# Patient Record
Sex: Female | Born: 1985 | Race: White | Hispanic: No | Marital: Single | Smoking: Never smoker
Health system: Southern US, Community
[De-identification: ages and names within clinical notes are randomized; demographics above are authoritative.]

## PROBLEM LIST (undated history)

## (undated) DIAGNOSIS — D649 Anemia, unspecified: Secondary | ICD-10-CM

## (undated) DIAGNOSIS — J45909 Unspecified asthma, uncomplicated: Secondary | ICD-10-CM

## (undated) DIAGNOSIS — R55 Syncope and collapse: Secondary | ICD-10-CM

## (undated) DIAGNOSIS — K5909 Other constipation: Secondary | ICD-10-CM

## (undated) DIAGNOSIS — M6289 Other specified disorders of muscle: Secondary | ICD-10-CM

## (undated) DIAGNOSIS — K219 Gastro-esophageal reflux disease without esophagitis: Secondary | ICD-10-CM

## (undated) DIAGNOSIS — F419 Anxiety disorder, unspecified: Secondary | ICD-10-CM

## (undated) DIAGNOSIS — Q431 Hirschsprung's disease: Secondary | ICD-10-CM

## (undated) HISTORY — DX: Unspecified asthma, uncomplicated: J45.909

## (undated) HISTORY — DX: Anemia, unspecified: D64.9

## (undated) HISTORY — PX: OTHER SURGICAL HISTORY: SHX169

## (undated) HISTORY — DX: Hirschsprung's disease: Q43.1

## (undated) HISTORY — PX: ABDOMINAL HYSTERECTOMY: SHX81

## (undated) HISTORY — DX: Other specified disorders of muscle: M62.89

## (undated) HISTORY — DX: Other constipation: K59.09

## (undated) HISTORY — DX: Anxiety disorder, unspecified: F41.9

## (undated) HISTORY — DX: Syncope and collapse: R55

## (undated) HISTORY — DX: Gastro-esophageal reflux disease without esophagitis: K21.9

## (undated) HISTORY — PX: OVARIAN CYST REMOVAL: SHX89

## (undated) MED FILL — Iron Sucrose Inj 20 MG/ML (Fe Equiv): INTRAVENOUS | Qty: 5 | Status: AC

## (undated) NOTE — *Deleted (*Deleted)
[facility]  [address], [address] [place], [place] [address] Phone: [phone]  Fax: [phone]   Clinic Day:  06/26/2020  Referring physician: Rayetta Humphrey, MD  Chief Complaint: Latasha [name] is a 54 y.o. 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  HPI: The patient was last seen in the hematology clinic on 02/24/2020. At that time, she was fatigued.  Exam was stable. Hematocrit was 37.5, hemoglobin 12.5, platelets 213,000, WBC 6,400. Ferritin was 17. She received Venofer.  She received Venofer on 03/09/2020 and 03/16/2020.  Labs on 05/04/2020 revealed a hematocrit of 38.8, hemoglobin 13.1, platelets 238,000, WBC 5,400. Ferritin was 76.  During the interim, ***   Past Medical History:  Diagnosis Date  . Acid reflux   . Anemia   . Anxiety   . Asthma   . Chronic constipation   . Hirschsprung disease   . Pelvic floor dysfunction   . Syncope, cardiogenic     Past Surgical History:  Procedure Laterality Date  . abdomial adhesions removed  2012  . APPENDECTOMY  1997  . bladder repair surgery  1997  . c- sections  [id number]  . enodmetiosis removed  2012  . OVARIAN CYST REMOVAL  2003, 2012  . TONSILLECTOMY  2007    Family History  Problem Relation Age of Onset  . Cancer Mother   . Diabetes Mother   . Hypertension Mother   . Cancer Father   . Cancer Maternal Grandfather   . Factor IX deficiency Maternal Grandfather   . Factor IX deficiency Son   . Kidney cancer Neg Hx   . Bladder Cancer Neg Hx     Social History:  reports that she has never smoked. She has never used smokeless tobacco. She reports that she does not drink alcohol and does not use drugs. She lives in [place]. The patient is alone*** today.  Allergies:  Allergies  Allergen Reactions  . Meperidine Anaphylaxis    Other reaction(s): Other (See Comments) Other Reaction: Not Assessed  . Lactose     Other  reaction(s): Other (See Comments) Other Reaction: GI Upset  . Fludrocortisone Hives  . Levofloxacin Hives  . Sulfamethoxazole-Trimethoprim Other (See Comments)    Other reaction(s): Dizziness    Current Medications: Current Outpatient Medications  Medication Sig Dispense Refill  . Acidophilus Lactobacillus CAPS Take 1 tablet by mouth daily.  (Patient not taking: Reported on 02/23/2020)    . albuterol (PROAIR HFA) 108 (90 Base) MCG/ACT inhaler Inhale 1 puff into the lungs every 6 (six) hours as needed for wheezing or shortness of breath.     . EPINEPHrine (EPIPEN 2-PAK) 0.3 mg/0.3 mL IJ SOAJ injection Inject 0.3 mg into the muscle as needed. Reported on 02/07/2016 (Patient not taking: Reported on 02/23/2020)    . fluticasone (FLONASE) 50 MCG/ACT nasal spray SHAKE LQ AND U 2 SPRAYS IEN QD    . hydrOXYzine (ATARAX/VISTARIL) 25 MG tablet TK 1 T PO QD PRN    . midodrine (PROAMATINE) 10 MG tablet Take 10 mg by mouth daily.     . norethindrone-ethinyl estradiol (JUNEL 1/20) 1-20 MG-MCG tablet Take 1 tablet by mouth daily.    Marland Kitchen omeprazole (PRILOSEC) 40 MG capsule 1 capsule by mouth daily  0  . ondansetron (ZOFRAN) 4 MG tablet Take 4 mg by mouth every 8 (eight) hours as needed for nausea.  (Patient not taking: Reported on 02/23/2020)    . ondansetron (ZOFRAN-ODT)  4 MG disintegrating tablet Take by mouth. (Patient not taking: Reported on 02/23/2020)    . Plecanatide 3 MG TABS Take 3 mg by mouth daily.     . promethazine (PHENERGAN) 25 MG tablet Take 25 mg by mouth every 8 (eight) hours as needed for vomiting.  (Patient not taking: Reported on 02/23/2020)    . sertraline (ZOLOFT) 100 MG tablet Take 200 mg by mouth daily.     . Sodium Phosphates (RA ENEMA) 7-19 GM/118ML ENEM Place rectally as needed (constipation).  (Patient not taking: Reported on 02/23/2020)     No current facility-administered medications for this visit.    Review of Systems  Constitutional: Positive for malaise/fatigue (worsening). Negative  for chills, diaphoresis, fever and weight loss.       Feels "like things are trending down".  HENT: Negative.  Negative for congestion, ear discharge, ear pain, hearing loss, nosebleeds, sinus pain, sore throat and tinnitus.   Eyes: Negative.  Negative for blurred vision and double vision.  Respiratory: Negative.  Negative for cough, hemoptysis, sputum production and shortness of breath.   Cardiovascular: Positive for palpitations. Negative for chest pain, orthopnea, leg swelling and PND.  Gastrointestinal: Negative for abdominal pain, blood in stool, constipation, diarrhea, heartburn, melena, nausea and vomiting.       Ice pica. Bowels are "terrible".  Genitourinary: Negative.  Negative for dysuria, frequency, hematuria and urgency.       Normal menstrual cycles.   Musculoskeletal: Negative.  Negative for back pain, falls, joint pain and myalgias.  Skin: Negative.  Negative for itching and rash.  Neurological: Negative.  Negative for dizziness, tremors, speech change, focal weakness, weakness and headaches.  Endo/Heme/Allergies: Bruises/bleeds easily.  Psychiatric/Behavioral: Negative.  Negative for depression and memory loss. The patient is not nervous/anxious and does not have insomnia.   All other systems reviewed and are negative.  Performance status (ECOG):  1***  Vital Signs There were no vitals taken for this visit.   Physical Exam Vitals and nursing note reviewed.  Constitutional:      General: She is not in acute distress.    Appearance: She is well-developed. She is not diaphoretic.  HENT:     Head: Normocephalic and atraumatic.     Comments: Long brown hair.    Mouth/Throat:     Mouth: Mucous membranes are moist.     Pharynx: Oropharynx is clear.  Eyes:     General: No scleral icterus.    Extraocular Movements: Extraocular movements intact.     Conjunctiva/sclera: Conjunctivae normal.     Pupils: Pupils are equal, round, and reactive to light.     Comments: Blue  eyes.  Cardiovascular:     Rate and Rhythm: Normal rate and regular rhythm.     Heart sounds: Normal heart sounds. No murmur heard.   Pulmonary:     Effort: Pulmonary effort is normal. No respiratory distress.     Breath sounds: Normal breath sounds. No wheezing or rales.  Chest:     Chest wall: No tenderness.  Abdominal:     General: Bowel sounds are normal. There is no distension.     Palpations: Abdomen is soft. There is no mass.     Tenderness: There is no abdominal tenderness. There is no guarding or rebound.  Musculoskeletal:        General: No swelling or tenderness. Normal range of motion.     Cervical back: Normal range of motion and neck supple.  Lymphadenopathy:     Head:  Right side of head: No preauricular, posterior auricular or occipital adenopathy.     Left side of head: No preauricular, posterior auricular or occipital adenopathy.     Cervical: No cervical adenopathy.     Upper Body:     Right upper body: No supraclavicular or axillary adenopathy.     Left upper body: No supraclavicular or axillary adenopathy.     Lower Body: No right inguinal adenopathy. No left inguinal adenopathy.  Skin:    General: Skin is warm and dry.  Neurological:     Mental Status: She is alert and oriented to person, place, and time.  Psychiatric:        Behavior: Behavior normal.        Thought Content: Thought content normal.        Judgment: Judgment normal.    No visits with results within 3 Day(s) from this visit.  Latest known visit with results is:  Appointment on 05/04/2020  Component Date Value Ref Range Status  . Ferritin 05/04/2020 76  11 - 307 ng/mL Final   Performed at [facility], [address] [place] [place], [place] [address]  . WBC 05/04/2020 5.4  4.0 - 10.5 K/uL Final  . RBC 05/04/2020 4.06  3.87 - 5.11 MIL/uL Final  . Hemoglobin 05/04/2020 13.1  12.0 - 15.0 g/dL Final  . HCT 60/45/4098 38.8  36 - 46 % Final  . MCV 05/04/2020 95.6  80.0 - 100.0 fL  Final  . MCH 05/04/2020 32.3  26.0 - 34.0 pg Final  . MCHC 05/04/2020 33.8  30.0 - 36.0 g/dL Final  . RDW 11/91/4782 11.9  11.5 - 15.5 % Final  . Platelets 05/04/2020 238  150 - 400 K/uL Final  . nRBC 05/04/2020 0.0  0.0 - 0.2 % Final  . Neutrophils Relative % 05/04/2020 52  % Final  . Neutro Abs 05/04/2020 2.9  1.7 - 7.7 K/uL Final  . Lymphocytes Relative 05/04/2020 36  % Final  . Lymphs Abs 05/04/2020 1.9  0.7 - 4.0 K/uL Final  . Monocytes Relative 05/04/2020 8  % Final  . Monocytes Absolute 05/04/2020 0.4  0.1 - 1.0 K/uL Final  . Eosinophils Relative 05/04/2020 3  % Final  . Eosinophils Absolute 05/04/2020 0.2  0.0 - 0.5 K/uL Final  . Basophils Relative 05/04/2020 1  % Final  . Basophils Absolute 05/04/2020 0.0  0.0 - 0.1 K/uL Final  . Immature Granulocytes 05/04/2020 0  % Final  . Abs Immature Granulocytes 05/04/2020 0.01  0.00 - 0.07 K/uL Final   Performed at [facility], [address], [place], [place] [address]    Assessment:  Latasha [name] is a 78 y.o. female with chronic constipation andiron deficiency anemia. Ferritin was 3 on 10/04/2014 consistent with iron deficiency. She denies any melena or hematochezia. She has a history of heavy mensesfelt secondary to endometriosis. Menses is now light on BCP.  EGDwas normal in 08/2016. Colonoscopywas normal in 09/2015. She has a history ofmicroscopic hematuria. CT hematuria work-up on 04/29/2017 was negative. She is followed by urology.  Work-upon 07/20/2016confirmed iron deficiency (ferritin 5). B12 and folate were normal.   She has received Venofer 600 mg (03/15/2015 - 04/19/2015), x2 (08/09/2015 - 08/16/2015), x1 (10/31/2015), x2 (05/08/2016 - 05/15/2016), x2 (03/05/2017 - 03/12/2017), x2 (06/04/2017 - 06/11/2017), x3 (12/24/2017 - 01/15/2018), x 2 (06/24/2018 - 07/01/2018), x 2 (02/25/2019 - 03/04/2019), and x3 (02/24/2020 - 03/16/2020). She receives Venoferif ferritin is <50 with symptoms.   Ferritinhas been  followed: 5 on 03/08/2015, 28 on 03/29/2015, 64 on 04/26/2015, 9 on 07/26/2015, 40 on 08/23/2015, 35 on 09/20/2015 29 on 10/25/2015, 32 on 02/07/2016, 36 on 05/08/2016, 39 on 02/17/2017, 39 on 05/27/2017, 42 on 09/03/2017, 32 on 12/23/2017, 68 on 03/26/2018, 42 on 06/23/2018 102 on 09/24/2018, 32 on 12/22/2018, 20 on 02/24/2019, 70 on 08/25/2019, 36 on 11/24/2019, 17 on 02/23/2020, 76 on 05/04/2020, and 109 on 06/27/2020.  She has a son with factor IX deficiency. Another son is being evaluated for von Willebrand's disease. Work-up on 07/20/2016revealed a normal platelet count, PT, PTT, von Willebrand panel, and multimers.  She has a history of microscopic hematuria. CT hematuria workup on 04/29/2017 revealed no findings to account for the patient's microscopic hematuria. She is followed by urology. Repeat urinalysis revealed no microscopic hematuria.  The patient received the Pfizer COVID-19 vaccine on 11/05/2019 and 11/26/2019.  Symptomatically, ***  Plan: 1.   Review labs from 06/27/2020.   2.   Iron deficiency             Hematocrit 36.9.  Hemoglobin 12.2.  MCV 95.1.             Ferritin 109.             Continue IV iron if ferritin < 30 and symptomatic.             She has become symptomatic.  Venofer 100 mg IV today and weekly x 2 (total 3).   Urine pregnancy test prior to infusions. 3.   RTC in 2 months for labs (CBC with diff, ferritin). 4.   RTC in 4 months for MD assessment, labs (CBC with diff, ferritin- day before) and +/- Venofer.  I discussed the assessment and treatment plan with the patient.  The patient was provided an opportunity to ask questions and all were answered.  The patient agreed with the plan and demonstrated an understanding of the instructions.  The patient was advised to call back if the symptoms worsen or if the condition fails to improve as anticipated.  I provided *** minutes of face-to-face time during this this encounter and > 50%  was spent counseling as documented under my assessment and plan.  Rosey [place], MD, PhD    06/26/2020, 3:29 PM  I, Danella Penton Tufford, am acting as Neurosurgeon for [employer]. Merlene Pulling, MD, PhD.  I, Melissa C. Merlene Pulling, MD, have reviewed the above documentation for accuracy and completeness, and I agree with the above.

---
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
## 2006-08-28 ENCOUNTER — Emergency Department: Payer: Self-pay | Admitting: Emergency Medicine

## 2007-04-10 ENCOUNTER — Ambulatory Visit: Payer: Self-pay | Admitting: Emergency Medicine

## 2007-09-03 DIAGNOSIS — K5090 Crohn's disease, unspecified, without complications: Secondary | ICD-10-CM | POA: Insufficient documentation

## 2007-12-04 ENCOUNTER — Other Ambulatory Visit: Payer: Self-pay

## 2007-12-04 ENCOUNTER — Emergency Department: Payer: Self-pay | Admitting: Emergency Medicine

## 2007-12-05 ENCOUNTER — Ambulatory Visit: Payer: Self-pay | Admitting: Vascular Surgery

## 2008-09-04 ENCOUNTER — Emergency Department: Payer: Self-pay | Admitting: Emergency Medicine

## 2008-09-12 ENCOUNTER — Emergency Department: Payer: Self-pay | Admitting: Emergency Medicine

## 2008-11-22 ENCOUNTER — Emergency Department: Payer: Self-pay | Admitting: Emergency Medicine

## 2008-12-07 ENCOUNTER — Ambulatory Visit: Payer: Self-pay | Admitting: Internal Medicine

## 2009-04-04 ENCOUNTER — Ambulatory Visit: Payer: Self-pay | Admitting: Urology

## 2009-05-29 ENCOUNTER — Emergency Department (HOSPITAL_COMMUNITY): Admission: EM | Admit: 2009-05-29 | Discharge: 2009-05-29 | Payer: Self-pay | Admitting: Emergency Medicine

## 2009-08-09 ENCOUNTER — Ambulatory Visit (HOSPITAL_BASED_OUTPATIENT_CLINIC_OR_DEPARTMENT_OTHER): Admission: RE | Admit: 2009-08-09 | Discharge: 2009-08-09 | Payer: Self-pay | Admitting: Neurology

## 2009-09-15 ENCOUNTER — Encounter: Admission: RE | Admit: 2009-09-15 | Discharge: 2009-09-15 | Payer: Self-pay | Admitting: Neurology

## 2009-10-03 ENCOUNTER — Ambulatory Visit (HOSPITAL_BASED_OUTPATIENT_CLINIC_OR_DEPARTMENT_OTHER): Admission: RE | Admit: 2009-10-03 | Discharge: 2009-10-03 | Payer: Self-pay | Admitting: Neurology

## 2009-10-08 ENCOUNTER — Ambulatory Visit: Payer: Self-pay | Admitting: Pulmonary Disease

## 2010-07-23 ENCOUNTER — Ambulatory Visit: Payer: Self-pay | Admitting: Family Medicine

## 2010-08-19 HISTORY — PX: OTHER SURGICAL HISTORY: SHX169

## 2010-11-22 LAB — CBC
HCT: 34.7 % — ABNORMAL LOW (ref 36.0–46.0)
MCV: 92.5 fL (ref 78.0–100.0)
RBC: 3.74 MIL/uL — ABNORMAL LOW (ref 3.87–5.11)
WBC: 8.2 10*3/uL (ref 4.0–10.5)

## 2010-11-22 LAB — DIFFERENTIAL
Lymphocytes Relative: 23 % (ref 12–46)
Lymphs Abs: 1.9 10*3/uL (ref 0.7–4.0)
Monocytes Relative: 8 % (ref 3–12)
Neutrophils Relative %: 66 % (ref 43–77)

## 2010-11-22 LAB — WET PREP, GENITAL
Clue Cells Wet Prep HPF POC: NONE SEEN
Trich, Wet Prep: NONE SEEN
Yeast Wet Prep HPF POC: NONE SEEN

## 2010-11-22 LAB — URINALYSIS, ROUTINE W REFLEX MICROSCOPIC
Bilirubin Urine: NEGATIVE
Glucose, UA: NEGATIVE mg/dL
Ketones, ur: NEGATIVE mg/dL
Leukocytes, UA: NEGATIVE
Protein, ur: NEGATIVE mg/dL

## 2010-11-22 LAB — GC/CHLAMYDIA PROBE AMP, GENITAL: Chlamydia, DNA Probe: NEGATIVE

## 2010-11-22 LAB — PREGNANCY, URINE: Preg Test, Ur: NEGATIVE

## 2010-11-22 LAB — BASIC METABOLIC PANEL
CO2: 30 mEq/L (ref 19–32)
Calcium: 8.8 mg/dL (ref 8.4–10.5)
Chloride: 110 mEq/L (ref 96–112)
Potassium: 3.6 mEq/L (ref 3.5–5.1)
Sodium: 143 mEq/L (ref 135–145)

## 2011-04-20 ENCOUNTER — Emergency Department: Payer: Self-pay | Admitting: Emergency Medicine

## 2011-07-12 ENCOUNTER — Ambulatory Visit: Payer: Self-pay

## 2011-07-19 DIAGNOSIS — R55 Syncope and collapse: Secondary | ICD-10-CM | POA: Insufficient documentation

## 2011-07-19 DIAGNOSIS — K219 Gastro-esophageal reflux disease without esophagitis: Secondary | ICD-10-CM | POA: Insufficient documentation

## 2011-07-19 DIAGNOSIS — J45909 Unspecified asthma, uncomplicated: Secondary | ICD-10-CM | POA: Insufficient documentation

## 2011-07-19 DIAGNOSIS — J309 Allergic rhinitis, unspecified: Secondary | ICD-10-CM | POA: Insufficient documentation

## 2011-07-19 DIAGNOSIS — F419 Anxiety disorder, unspecified: Secondary | ICD-10-CM | POA: Insufficient documentation

## 2011-12-26 ENCOUNTER — Emergency Department: Payer: Self-pay | Admitting: *Deleted

## 2011-12-26 LAB — CBC
HCT: 37.9 % (ref 35.0–47.0)
HGB: 12.7 g/dL (ref 12.0–16.0)
MCH: 30.5 pg (ref 26.0–34.0)
MCHC: 33.5 g/dL (ref 32.0–36.0)
MCV: 91 fL (ref 80–100)
Platelet: 191 10*3/uL (ref 150–440)
RBC: 4.16 10*6/uL (ref 3.80–5.20)
RDW: 13.4 % (ref 11.5–14.5)
WBC: 5.6 10*3/uL (ref 3.6–11.0)

## 2011-12-26 LAB — URINALYSIS, COMPLETE
Ketone: NEGATIVE
RBC,UR: NONE SEEN /HPF (ref 0–5)
Specific Gravity: 1.01 (ref 1.003–1.030)
Squamous Epithelial: 7
WBC UR: <1 /HPF (ref 0–5)

## 2011-12-26 LAB — COMPREHENSIVE METABOLIC PANEL
Albumin: 3.9 g/dL (ref 3.4–5.0)
BUN: 13 mg/dL (ref 7–18)
Bilirubin,Total: 0.3 mg/dL (ref 0.2–1.0)
Calcium, Total: 8.8 mg/dL (ref 8.5–10.1)
Chloride: 107 mmol/L (ref 98–107)
Co2: 28 mmol/L (ref 21–32)
EGFR (African American): >60
EGFR (Non-African Amer.): >60
Glucose: 96 mg/dL (ref 65–99)
Osmolality: 279 (ref 275–301)
SGPT (ALT): 67 U/L
Sodium: 140 mmol/L (ref 136–145)
Total Protein: 7.3 g/dL (ref 6.4–8.2)

## 2012-03-09 DIAGNOSIS — F32A Depression, unspecified: Secondary | ICD-10-CM | POA: Insufficient documentation

## 2014-07-15 ENCOUNTER — Emergency Department: Payer: Self-pay | Admitting: Emergency Medicine

## 2014-07-15 LAB — URINALYSIS, COMPLETE
BILIRUBIN, UR: NEGATIVE
Bacteria: NONE SEEN
Blood: NEGATIVE
GLUCOSE, UR: NEGATIVE mg/dL (ref 0–75)
KETONE: NEGATIVE
NITRITE: NEGATIVE
PROTEIN: NEGATIVE
Ph: 6 (ref 4.5–8.0)
RBC,UR: 3 /HPF (ref 0–5)
Specific Gravity: 1.02 (ref 1.003–1.030)
WBC UR: 8 /HPF (ref 0–5)

## 2014-07-15 LAB — COMPREHENSIVE METABOLIC PANEL
ALBUMIN: 3.7 g/dL (ref 3.4–5.0)
ALK PHOS: 59 U/L
ANION GAP: 4 — AB (ref 7–16)
BUN: 9 mg/dL (ref 7–18)
Bilirubin,Total: 0.3 mg/dL (ref 0.2–1.0)
CALCIUM: 8.2 mg/dL — AB (ref 8.5–10.1)
CHLORIDE: 106 mmol/L (ref 98–107)
CO2: 29 mmol/L (ref 21–32)
Creatinine: 0.96 mg/dL (ref 0.60–1.30)
Glucose: 124 mg/dL — ABNORMAL HIGH (ref 65–99)
Osmolality: 278 (ref 275–301)
POTASSIUM: 3.5 mmol/L (ref 3.5–5.1)
SGOT(AST): 22 U/L (ref 15–37)
SGPT (ALT): 27 U/L
SODIUM: 139 mmol/L (ref 136–145)
Total Protein: 6.9 g/dL (ref 6.4–8.2)

## 2014-07-15 LAB — CBC WITH DIFFERENTIAL/PLATELET
BASOS ABS: 0 10*3/uL (ref 0.0–0.1)
Basophil %: 0.2 %
EOS ABS: 0.4 10*3/uL (ref 0.0–0.7)
EOS PCT: 3.2 %
HCT: 34.8 % — AB (ref 35.0–47.0)
HGB: 11 g/dL — AB (ref 12.0–16.0)
LYMPHS PCT: 1.7 %
Lymphocyte #: 0.2 10*3/uL — ABNORMAL LOW (ref 1.0–3.6)
MCH: 26.8 pg (ref 26.0–34.0)
MCHC: 31.6 g/dL — AB (ref 32.0–36.0)
MCV: 85 fL (ref 80–100)
MONO ABS: 0.5 x10 3/mm (ref 0.2–0.9)
MONOS PCT: 3.4 %
NEUTROS ABS: 13 10*3/uL — AB (ref 1.4–6.5)
Neutrophil %: 91.5 %
PLATELETS: 199 10*3/uL (ref 150–440)
RBC: 4.1 10*6/uL (ref 3.80–5.20)
RDW: 16.5 % — ABNORMAL HIGH (ref 11.5–14.5)
WBC: 14.2 10*3/uL — AB (ref 3.6–11.0)

## 2014-07-25 DIAGNOSIS — A483 Toxic shock syndrome: Secondary | ICD-10-CM | POA: Insufficient documentation

## 2014-07-25 DIAGNOSIS — B958 Unspecified staphylococcus as the cause of diseases classified elsewhere: Secondary | ICD-10-CM | POA: Insufficient documentation

## 2015-02-20 DIAGNOSIS — G8929 Other chronic pain: Secondary | ICD-10-CM | POA: Insufficient documentation

## 2015-02-20 DIAGNOSIS — R519 Headache, unspecified: Secondary | ICD-10-CM | POA: Insufficient documentation

## 2015-03-01 ENCOUNTER — Ambulatory Visit: Payer: Self-pay | Admitting: Hematology and Oncology

## 2015-03-08 ENCOUNTER — Other Ambulatory Visit: Payer: Self-pay

## 2015-03-08 ENCOUNTER — Inpatient Hospital Stay: Payer: Medicare Other | Attending: Hematology and Oncology | Admitting: Hematology and Oncology

## 2015-03-08 ENCOUNTER — Inpatient Hospital Stay: Payer: Medicare Other

## 2015-03-08 ENCOUNTER — Encounter: Payer: Self-pay | Admitting: Hematology and Oncology

## 2015-03-08 VITALS — BP 110/67 | HR 71 | Temp 97.7°F | Ht 65.0 in | Wt 126.8 lb

## 2015-03-08 DIAGNOSIS — L659 Nonscarring hair loss, unspecified: Secondary | ICD-10-CM

## 2015-03-08 DIAGNOSIS — J45909 Unspecified asthma, uncomplicated: Secondary | ICD-10-CM

## 2015-03-08 DIAGNOSIS — R112 Nausea with vomiting, unspecified: Secondary | ICD-10-CM | POA: Diagnosis not present

## 2015-03-08 DIAGNOSIS — R233 Spontaneous ecchymoses: Secondary | ICD-10-CM

## 2015-03-08 DIAGNOSIS — D509 Iron deficiency anemia, unspecified: Secondary | ICD-10-CM | POA: Diagnosis not present

## 2015-03-08 DIAGNOSIS — R238 Other skin changes: Secondary | ICD-10-CM

## 2015-03-08 DIAGNOSIS — Q731 Phocomelia, unspecified limb(s): Secondary | ICD-10-CM

## 2015-03-08 DIAGNOSIS — Q431 Hirschsprung's disease: Secondary | ICD-10-CM | POA: Insufficient documentation

## 2015-03-08 DIAGNOSIS — R5383 Other fatigue: Secondary | ICD-10-CM | POA: Diagnosis not present

## 2015-03-08 DIAGNOSIS — D649 Anemia, unspecified: Secondary | ICD-10-CM | POA: Insufficient documentation

## 2015-03-08 DIAGNOSIS — F419 Anxiety disorder, unspecified: Secondary | ICD-10-CM | POA: Diagnosis not present

## 2015-03-08 DIAGNOSIS — N920 Excessive and frequent menstruation with regular cycle: Secondary | ICD-10-CM | POA: Insufficient documentation

## 2015-03-08 DIAGNOSIS — N921 Excessive and frequent menstruation with irregular cycle: Secondary | ICD-10-CM | POA: Diagnosis not present

## 2015-03-08 DIAGNOSIS — Z79899 Other long term (current) drug therapy: Secondary | ICD-10-CM

## 2015-03-08 DIAGNOSIS — F508 Other eating disorders: Secondary | ICD-10-CM | POA: Diagnosis not present

## 2015-03-08 DIAGNOSIS — K5900 Constipation, unspecified: Secondary | ICD-10-CM

## 2015-03-08 DIAGNOSIS — Z862 Personal history of diseases of the blood and blood-forming organs and certain disorders involving the immune mechanism: Secondary | ICD-10-CM | POA: Diagnosis not present

## 2015-03-08 DIAGNOSIS — Z9049 Acquired absence of other specified parts of digestive tract: Secondary | ICD-10-CM

## 2015-03-08 LAB — CBC WITH DIFFERENTIAL/PLATELET
Basophils Absolute: 0 10*3/uL (ref 0–0.1)
Basophils Relative: 1 %
Eosinophils Absolute: 0.1 10*3/uL (ref 0–0.7)
Eosinophils Relative: 2 %
HCT: 38.7 % (ref 35.0–47.0)
Hemoglobin: 12.6 g/dL (ref 12.0–16.0)
Lymphocytes Relative: 33 %
Lymphs Abs: 2.1 10*3/uL (ref 1.0–3.6)
MCH: 27.4 pg (ref 26.0–34.0)
MCHC: 32.5 g/dL (ref 32.0–36.0)
MCV: 84.4 fL (ref 80.0–100.0)
Monocytes Absolute: 0.7 10*3/uL (ref 0.2–0.9)
Monocytes Relative: 11 %
Neutro Abs: 3.3 10*3/uL (ref 1.4–6.5)
Neutrophils Relative %: 53 %
Platelets: 223 10*3/uL (ref 150–440)
RBC: 4.59 MIL/uL (ref 3.80–5.20)
RDW: 16.1 % — ABNORMAL HIGH (ref 11.5–14.5)
WBC: 6.2 10*3/uL (ref 3.6–11.0)

## 2015-03-08 LAB — VITAMIN B12: Vitamin B-12: 640 pg/mL (ref 180–914)

## 2015-03-08 LAB — IRON AND TIBC
Iron: 57 ug/dL (ref 28–170)
Saturation Ratios: 12 % (ref 10.4–31.8)
TIBC: 488 ug/dL — ABNORMAL HIGH (ref 250–450)
UIBC: 431 ug/dL

## 2015-03-08 LAB — PROTIME-INR
INR: 0.99
Prothrombin Time: 13.1 seconds (ref 11.4–15.0)

## 2015-03-08 LAB — FOLATE: Folate: 44 ng/mL (ref >5.9)

## 2015-03-08 LAB — FERRITIN: Ferritin: 5 ng/mL — ABNORMAL LOW (ref 11–307)

## 2015-03-08 LAB — APTT: aPTT: 30 seconds (ref 24–36)

## 2015-03-08 NOTE — Progress Notes (Signed)
Pt here today for initial consult regarding IDA; [facility] Greggory St[facility]; c/o hair loss and pica (severe ice eating); fatigue, heavy periods

## 2015-03-08 NOTE — Progress Notes (Signed)
[facility]  Clinic day:  03/08/2015  Chief Complaint: Latasha [name] is a 29 y.o. female with anemia who is referred in consultation by Dr. Angus Palms.  HPI: The patient notes chronic abdominal problems since the age of 83 when she was diagnosed with Hirshsprung's disease.  She typically has bowel movements every 2-3 weeks with the aide of Miralax, suppositories, and enemas.  She has frequent problems with nausea and vomiting secondary to "being backed up".  She states that her diet consists of vegetables and daily meat.  Oral iron makes her constipation and abdominal symptoms worse.  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  She notes that the delivery was complicated by neurocardiogenic syncope.  She underwent C-section at 36 weeks.  Her second pregnancy was worse in 2014.  She required admission for syncope.  She again delivered via C-section at 36 weeks.  She notes heavy menses for 7 days each month.  She will go though 12 ultra-absorbant pads/tampons a day.  Sometimes she will leak.  She notes that her bleeding is similar to post-partum bleeding.  She has been diagnosed with endometriosis in the past.  She has not followed up with gynecology since 2014.  Her son has hemophilia (factor IX deficiency).  This was discovered secondary to significant bruising at birth and excess bleeding with circumcision.  Her 2nd son is currently being tested for von Willebrand's disease.  Labs on 10/04/2014 revealed a hematocrit of 33.4, hemoglobin 10.2, MCV 88, and white blood count 5000.  Ferritin was 3.  She notes a history of excess bruising.  She has had no excess bleeding with surgeries.  Symptomatically, she has hair loss and fatigue.  She feels cold all of the time.  She has ice pica.  Past Medical History  Diagnosis Date  . Anemia   . Syncope, cardiogenic   . Asthma   . Hirschsprung disease   . Anxiety     Past Surgical History   Procedure Laterality Date  . Appendectomy  1997  . Ovarian cyst removal  2003, 2012  . Abdomial adhesions removed  2012  . Bladder repair surgery  1997  . Tonsillectomy  2007  . Enodmetiosis removed  2012  . C- sections  [id number]    Family History  Problem Relation Age of Onset  . Cancer Mother   . Diabetes Mother   . Hypertension Mother   . Cancer Father   . Cancer Maternal Grandfather   . Factor IX deficiency Maternal Grandfather   . Factor IX deficiency Son     Social History:  reports that she has never smoked. She has never used smokeless tobacco. She reports that she does not drink alcohol or use illicit drugs.  The patient is accompanied by her fiance, Latasha [name].  Allergies:  Allergies  Allergen Reactions  . Meperidine Anaphylaxis    Other reaction(s): Other (See Comments) Other Reaction: Not Assessed  . Lactose     Other reaction(s): Other (See Comments) Other Reaction: GI Upset  . Fludrocortisone Hives  . Levofloxacin Hives    Current Medications: Current Outpatient Prescriptions  Medication Sig Dispense Refill  . EPINEPHrine (EPIPEN 2-PAK) 0.3 mg/0.3 mL IJ SOAJ injection Inject into the muscle.    Marland Kitchen Linaclotide (LINZESS) 290 MCG CAPS capsule Take by mouth.    . midodrine (PROAMATINE) 10 MG tablet Take by mouth.    Marland Kitchen omeprazole (PRILOSEC) 40 MG capsule     .  ondansetron (ZOFRAN-ODT) 4 MG disintegrating tablet Take by mouth.    . promethazine (PHENERGAN) 25 MG tablet Take by mouth.    . sertraline (ZOLOFT) 100 MG tablet Take by mouth.    Marland Kitchen. MICROGESTIN 1.5-30 MG-MCG tablet   11  . omeprazole (PRILOSEC) 40 MG capsule TK ONE C PO  QD  0  . polyethylene glycol (MIRALAX / GLYCOLAX) packet Take by mouth.    . SUMAtriptan (IMITREX) 25 MG tablet   3   No current facility-administered medications for this visit.   Review of Systems:  GENERAL:  Fatigued.  No fevers, sweats or weight loss. PERFORMANCE STATUS (ECOG):  1 HEENT:  No visual changes, runny nose, sore  throat, mouth sores or tenderness. Lungs: No shortness of breath or cough.  No hemoptysis. Cardiac:  No chest pain, palpitations, orthopnea, or PND. GI:  Chronic constipation.  Nausea and vomiting secondary to constipation.  No diarrhea, melena or hematochezia. GU:  No urgency, frequency, dysuria, or hematuria. Musculoskeletal:  No back pain.  No joint pain.  No muscle tenderness. Extremities:  No pain or swelling. Skin:  Hair loss.  Easy bruising.  No rashes or skin changes. Neuro:  No headache, numbness or weakness, balance or coordination issues. Endocrine:  Feels cold all of the time.  No diabetes, thyroid issues, hot flashes or night sweats. Psych:  No mood changes, depression or anxiety. Pain:  No focal pain. Review of systems:  All other systems reviewed and found to be negative.   Physical Exam: Blood pressure 110/67, pulse 71, temperature 97.7 F (36.5 C), temperature source Tympanic, height 5\' 5"  (1.651 m), weight 126 lb 12.2 oz (57.5 kg). GENERAL:  Thin tall young woman sitting comfortably in the exam room in no acute distress. MENTAL STATUS:  Alert and oriented to person, place and time. HEAD:  Brown hair pulled up.  Normocephalic, atraumatic, face symmetric, no Cushingoid features. EYES:  Blue eyes.  Pupils equal round and reactive to light and accomodation.  No conjunctivitis or scleral icterus. ENT:  Oropharynx clear without lesion.  Tongue normal. Mucous membranes moist.  RESPIRATORY:  Clear to auscultation without rales, wheezes or rhonchi. CARDIOVASCULAR:  Regular rate and rhythm without murmur, rub or gallop. ABDOMEN:  Soft, non-tender, with active bowel sounds, and no hepatosplenomegaly.  No masses. SKIN:  No rashes, ulcers or lesions. EXTREMITIES: No edema, no skin discoloration or tenderness.  No palpable cords. LYMPH NODES: No palpable cervical, supraclavicular, axillary or inguinal adenopathy  NEUROLOGICAL: Unremarkable. PSYCH:  Appropriate.   Appointment on  03/08/2015  Component Date Value Ref Range Status  . WBC 03/08/2015 6.2  3.6 - 11.0 K/uL Final  . RBC 03/08/2015 4.59  3.80 - 5.20 MIL/uL Final  . Hemoglobin 03/08/2015 12.6  12.0 - 16.0 g/dL Final  . HCT 16/10/960407/20/2016 38.7  35.0 - 47.0 % Final  . MCV 03/08/2015 84.4  80.0 - 100.0 fL Final  . MCH 03/08/2015 27.4  26.0 - 34.0 pg Final  . MCHC 03/08/2015 32.5  32.0 - 36.0 g/dL Final  . RDW 54/09/811907/20/2016 16.1* 11.5 - 14.5 % Final  . Platelets 03/08/2015 223  150 - 440 K/uL Final  . Neutrophils Relative % 03/08/2015 53   Final  . Neutro Abs 03/08/2015 3.3  1.4 - 6.5 K/uL Final  . Lymphocytes Relative 03/08/2015 33   Final  . Lymphs Abs 03/08/2015 2.1  1.0 - 3.6 K/uL Final  . Monocytes Relative 03/08/2015 11   Final  . Monocytes Absolute 03/08/2015 0.7  0.2 - 0.9 K/uL Final  . Eosinophils Relative 03/08/2015 2   Final  . Eosinophils Absolute 03/08/2015 0.1  0 - 0.7 K/uL Final  . Basophils Relative 03/08/2015 1   Final  . Basophils Absolute 03/08/2015 0.0  0 - 0.1 K/uL Final  . Prothrombin Time 03/08/2015 13.1  11.4 - 15.0 seconds Final  . INR 03/08/2015 0.99   Final    Assessment:  ALIVIANA BURDELL is a 29 y.o. 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  Ferritin was 3 on 10/04/2014 consistent with iron deficiency.  She denies any melena or hematochezia.  She has heavy menses felt secondary to endometriosis.    She has a son with factor IX deficiency.  Another son is being evaluated for von Willebrand's disease.  Symptomatically, she is fatigued, feels cold, and has hair loss.  She bruises easily.  Exam is unremarkable.  Plan: 1. Discuss anemia and work-up.  Outside labs indicate iron deficiency.  Discuss symptoms of pica.  Discuss IV iron (Venofer) to replete iron stores.  2. Discuss family history of hemophilia and possible von Willebrand's.  Discuss patients's heavy menses and easy bruising.  Discuss work-up. 3. Labs today:  CBC  with diff, ferrtin, iron studies, B12, folate, TSH, PT, PTT, von Willebrand panel with multimers. 4. Peauth Venofer. 5. RTC weekly x 3 for Venofer. 6. RTC with 3rd infusion for MD assess, labs (CBC with diff, ferritin), and review of initial work-up.   Rosey [place], MD  03/08/2015, 3:22 PM

## 2015-03-09 LAB — COAG STUDIES INTERP REPORT: PDF Image: 0

## 2015-03-09 LAB — VON WILLEBRAND PANEL
Coagulation Factor VIII: 131 % (ref 50–150)
Ristocetin Co-factor, Plasma: 74 % (ref 50–150)
Von Willebrand Antigen, Plasma: 73 % (ref 50–150)

## 2015-03-14 ENCOUNTER — Encounter: Payer: Self-pay | Admitting: Hematology and Oncology

## 2015-03-15 ENCOUNTER — Inpatient Hospital Stay: Payer: Medicare Other

## 2015-03-15 ENCOUNTER — Other Ambulatory Visit: Payer: Self-pay | Admitting: Hematology and Oncology

## 2015-03-15 VITALS — BP 103/65 | HR 59 | Temp 97.7°F | Resp 20

## 2015-03-15 DIAGNOSIS — D509 Iron deficiency anemia, unspecified: Secondary | ICD-10-CM | POA: Diagnosis not present

## 2015-03-15 MED ORDER — SODIUM CHLORIDE 0.9 % IV SOLN
Freq: Once | INTRAVENOUS | Status: AC
  Administered 2015-03-15: 10:00:00 via INTRAVENOUS
  Filled 2015-03-15: qty 1000

## 2015-03-15 MED ORDER — HEPARIN SOD (PORK) LOCK FLUSH 100 UNIT/ML IV SOLN: 500.0000 [IU] | Freq: Once | INTRAVENOUS | Status: DC | PRN

## 2015-03-15 MED ORDER — SODIUM CHLORIDE 0.9 % IV SOLN
100.0000 mg | Freq: Once | INTRAVENOUS | Status: AC
  Administered 2015-03-15: 100 mg via INTRAVENOUS
  Filled 2015-03-15: qty 5

## 2015-03-16 ENCOUNTER — Inpatient Hospital Stay: Payer: Medicare Other

## 2015-03-22 ENCOUNTER — Ambulatory Visit: Payer: Medicare Other

## 2015-03-22 ENCOUNTER — Other Ambulatory Visit: Payer: Self-pay | Admitting: Hematology and Oncology

## 2015-03-23 ENCOUNTER — Inpatient Hospital Stay: Payer: Medicare Other | Attending: Hematology and Oncology

## 2015-03-23 VITALS — BP 102/66 | HR 52 | Temp 97.8°F | Resp 16

## 2015-03-23 DIAGNOSIS — Z79899 Other long term (current) drug therapy: Secondary | ICD-10-CM | POA: Diagnosis not present

## 2015-03-23 DIAGNOSIS — D509 Iron deficiency anemia, unspecified: Secondary | ICD-10-CM | POA: Diagnosis present

## 2015-03-23 MED ORDER — SODIUM CHLORIDE 0.9 % IV SOLN
Freq: Once | INTRAVENOUS | Status: AC
  Administered 2015-03-23: 10:00:00 via INTRAVENOUS
  Filled 2015-03-23: qty 1000

## 2015-03-23 MED ORDER — SODIUM CHLORIDE 0.9 % IV SOLN
100.0000 mg | Freq: Once | INTRAVENOUS | Status: AC
  Administered 2015-03-23: 100 mg via INTRAVENOUS
  Filled 2015-03-23: qty 5

## 2015-03-24 LAB — VON WILLEBRAND FACTOR MULTIMER

## 2015-03-27 ENCOUNTER — Other Ambulatory Visit: Payer: Self-pay | Admitting: *Deleted

## 2015-03-27 DIAGNOSIS — D509 Iron deficiency anemia, unspecified: Secondary | ICD-10-CM

## 2015-03-29 ENCOUNTER — Ambulatory Visit: Payer: Medicare Other | Admitting: Hematology and Oncology

## 2015-03-29 ENCOUNTER — Inpatient Hospital Stay: Payer: Medicare Other

## 2015-03-29 ENCOUNTER — Ambulatory Visit: Payer: Medicare Other

## 2015-03-29 VITALS — BP 102/62 | HR 62 | Temp 96.0°F | Resp 18 | Ht 65.0 in | Wt 130.8 lb

## 2015-03-29 VITALS — BP 111/73 | HR 62 | Resp 18

## 2015-03-29 DIAGNOSIS — D509 Iron deficiency anemia, unspecified: Secondary | ICD-10-CM

## 2015-03-29 LAB — CBC WITH DIFFERENTIAL/PLATELET
Basophils Absolute: 0 10*3/uL (ref 0–0.1)
Basophils Relative: 1 %
Eosinophils Absolute: 0.1 10*3/uL (ref 0–0.7)
Eosinophils Relative: 2 %
HCT: 35.3 % (ref 35.0–47.0)
Hemoglobin: 11.5 g/dL — ABNORMAL LOW (ref 12.0–16.0)
Lymphocytes Relative: 25 %
Lymphs Abs: 1.3 10*3/uL (ref 1.0–3.6)
MCH: 28.1 pg (ref 26.0–34.0)
MCHC: 32.5 g/dL (ref 32.0–36.0)
MCV: 86.6 fL (ref 80.0–100.0)
Monocytes Absolute: 0.5 10*3/uL (ref 0.2–0.9)
Monocytes Relative: 10 %
Neutro Abs: 3.2 10*3/uL (ref 1.4–6.5)
Neutrophils Relative %: 62 %
Platelets: 199 10*3/uL (ref 150–440)
RBC: 4.08 MIL/uL (ref 3.80–5.20)
RDW: 17.7 % — ABNORMAL HIGH (ref 11.5–14.5)
WBC: 5.1 10*3/uL (ref 3.6–11.0)

## 2015-03-29 LAB — FERRITIN: Ferritin: 28 ng/mL (ref 11–307)

## 2015-03-29 MED ORDER — SODIUM CHLORIDE 0.9 % IV SOLN
100.0000 mg | Freq: Once | INTRAVENOUS | Status: AC
  Administered 2015-03-29: 100 mg via INTRAVENOUS
  Filled 2015-03-29: qty 5

## 2015-03-29 MED ORDER — SODIUM CHLORIDE 0.9 % IV SOLN
Freq: Once | INTRAVENOUS | Status: AC
  Administered 2015-03-29: 11:00:00 via INTRAVENOUS
  Filled 2015-03-29: qty 1000

## 2015-03-29 NOTE — Progress Notes (Signed)
[facility]-  Cancer Center  Clinic day:  03/29/2015  Chief Complaint: Latasha [name] is a 29 y.o. female with iron deficiency anemia who is seen for assessment after initiation of Venofer.  HPI: The patient was last seen in the medical oncology clinic on 03/08/2015.  At that time, she was seen for initial consultation regarding anemia.  Outside labs indicated iron deficiency. We discussed IV iron (Venofer) sees to replete iron stores are. 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  We discussed her heavy menses and easy easy bruising. She had a family history of both hemophilia and possible von Willebrand's disease.    CBC revealed a hematocrit of 38.7, hemoglobin 12.6, platelets 223,000, white count 6200 with an ANC of 3300. MCV was 84.4.  Ferritin was 5 and elevated TIBC of 488.  B12 and folate were normal. PT and PTT were both normal.  von Willebrand's panel and multimers were also normal.  Given her symptomatology, decision was made to proceed with low dose weekly Venofer (100 mg).  She received Venofer on 07/27 and 03/23/2015. She transiently increased her MiraLAX.  She notes that her ice chewing has improved. Her hair continues to fall out.  Past Medical History  Diagnosis Date  . Anemia   . Syncope, cardiogenic   . Asthma   . Hirschsprung disease   . Anxiety     Past Surgical History  Procedure Laterality Date  . Appendectomy  1997  . Ovarian cyst removal  2003, 2012  . Abdomial adhesions removed  2012  . Bladder repair surgery  1997  . Tonsillectomy  2007  . Enodmetiosis removed  2012  . C- sections  [id number]    Family History  Problem Relation Age of Onset  . Cancer Mother   . Diabetes Mother   . Hypertension Mother   . Cancer Father   . Cancer Maternal Grandfather   . Factor IX deficiency Maternal Grandfather   . Factor IX deficiency Son     Social History:  reports that she has never  smoked. She has never used smokeless tobacco. She reports that she does not drink alcohol or use illicit drugs.  The patient is accompanied by her fiance, Minerva Areola.  Allergies:  Allergies  Allergen Reactions  . Meperidine Anaphylaxis    Other reaction(s): Other (See Comments) Other Reaction: Not Assessed  . Lactose     Other reaction(s): Other (See Comments) Other Reaction: GI Upset  . Fludrocortisone Hives  . Levofloxacin Hives    Current Medications: Current Outpatient Prescriptions  Medication Sig Dispense Refill  . EPINEPHrine (EPIPEN 2-PAK) 0.3 mg/0.3 mL IJ SOAJ injection Inject into the muscle.    Marland Kitchen Linaclotide (LINZESS) 290 MCG CAPS capsule Take by mouth.    Marland Kitchen MICROGESTIN 1.5-30 MG-MCG tablet   11  . midodrine (PROAMATINE) 10 MG tablet Take by mouth.    Marland Kitchen omeprazole (PRILOSEC) 40 MG capsule TK ONE C PO  QD  0  . ondansetron (ZOFRAN-ODT) 4 MG disintegrating tablet Take by mouth.    . polyethylene glycol (MIRALAX / GLYCOLAX) packet Take by mouth.    . promethazine (PHENERGAN) 25 MG tablet Take by mouth.    . sertraline (ZOLOFT) 100 MG tablet Take by mouth.    . SUMAtriptan (IMITREX) 25 MG tablet   3   No current facility-administered medications for this visit.   Review of Systems:  GENERAL:  Fatigued.  No fevers, sweats or weight loss. PERFORMANCE STATUS (  ECOG):  1 HEENT:  No visual changes, runny nose, sore throat, mouth sores or tenderness. Lungs: No shortness of breath or cough.  No hemoptysis. Cardiac:  No chest pain, palpitations, orthopnea, or PND. GI:  Chronic constipation slightly worse with IV iron.  Nausea and vomiting secondary to constipation.  No diarrhea, melena or hematochezia. GU:  No urgency, frequency, dysuria, or hematuria. Musculoskeletal:  No back pain.  No joint pain.  No muscle tenderness. Extremities:  No pain or swelling. Skin:  Hair loss.  Easy bruising.  No rashes or skin changes. Neuro:  No headache, numbness or weakness, balance or coordination  issues. Endocrine:  Feels cold all of the time.  No diabetes, thyroid issues, hot flashes or night sweats. Psych:  No mood changes, depression or anxiety. Pain:  No focal pain. Review of systems:  All other systems reviewed and found to be negative.   Physical Exam: Blood pressure 102/62, pulse 62, temperature 96 F (35.6 C), temperature source Tympanic, resp. rate 18, height 5\' 5"  (1.651 m), weight 130 lb 13.5 oz (59.35 kg). GENERAL:  Thin tall young woman sitting comfortably in the exam room in no acute distress. MENTAL STATUS:  Alert and oriented to person, place and time. HEAD:  Brown hair.  Normocephalic, atraumatic, face symmetric, no Cushingoid features. EYES:  Blue eyes.  Pupils equal round and reactive to light and accomodation.  No conjunctivitis or scleral icterus. ENT:  Oropharynx clear without lesion.  Tongue normal. Mucous membranes moist.  RESPIRATORY:  Clear to auscultation without rales, wheezes or rhonchi. CARDIOVASCULAR:  Regular rate and rhythm without murmur, rub or gallop. ABDOMEN:  Soft, non-tender, with active bowel sounds, and no hepatosplenomegaly.  No masses. SKIN:  No rashes, ulcers or lesions. EXTREMITIES: No edema, no skin discoloration or tenderness.  No palpable cords. LYMPH NODES: No palpable cervical, supraclavicular, axillary or inguinal adenopathy  NEUROLOGICAL: Unremarkable. PSYCH:  Appropriate.   Appointment on 03/29/2015  Component Date Value Ref Range Status  . WBC 03/29/2015 5.1  3.6 - 11.0 K/uL Final  . RBC 03/29/2015 4.08  3.80 - 5.20 MIL/uL Final  . Hemoglobin 03/29/2015 11.5* 12.0 - 16.0 g/dL Final  . HCT 16/05/9603 35.3  35.0 - 47.0 % Final  . MCV 03/29/2015 86.6  80.0 - 100.0 fL Final  . MCH 03/29/2015 28.1  26.0 - 34.0 pg Final  . MCHC 03/29/2015 32.5  32.0 - 36.0 g/dL Final  . RDW 54/04/8118 17.7* 11.5 - 14.5 % Final  . Platelets 03/29/2015 199  150 - 440 K/uL Final  . Neutrophils Relative % 03/29/2015 62   Final  . Neutro Abs  03/29/2015 3.2  1.4 - 6.5 K/uL Final  . Lymphocytes Relative 03/29/2015 25   Final  . Lymphs Abs 03/29/2015 1.3  1.0 - 3.6 K/uL Final  . Monocytes Relative 03/29/2015 10   Final  . Monocytes Absolute 03/29/2015 0.5  0.2 - 0.9 K/uL Final  . Eosinophils Relative 03/29/2015 2   Final  . Eosinophils Absolute 03/29/2015 0.1  0 - 0.7 K/uL Final  . Basophils Relative 03/29/2015 1   Final  . Basophils Absolute 03/29/2015 0.0  0 - 0.1 K/uL Final    Assessment:  Latasha [name] is a 29 y.o. 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  Ferritin was 3 on 10/04/2014 consistent with iron deficiency.  She denies any melena or hematochezia.  She has heavy menses felt secondary to endometriosis.  Work-up on 03/08/2015 confirmed iron deficiency (ferritin 5).  B12 and folate were normal.  She has received weekly low dose Venofer x 2 (03/15/2015 - 03/23/2015) with improvement in symptoms.  She has a son with factor IX deficiency.  Another son is being evaluated for von Willebrand's disease.  Work-up on 03/08/2015 revealed a normal platelet count, PT, PTT, von willebrand panel, and multimers.  Symptomatically, she is less  fatigued.  Ice pica has improved. Exam is unremarkable.  Plan: 1. Review work-up for anemia and heavy menses.   2. Labs today:  CBC with diff, ferritin. 3. RTC weekly for Venofer 100 mg. 4. RTC in 4 weeks for MD assess, labs (CBC with diff, ferritin), and +/- Venofer.   Rosey Bath, MD  03/29/2015, 10:41 AM

## 2015-03-29 NOTE — Progress Notes (Signed)
Pt reports having less fatigue and pica.

## 2015-04-05 ENCOUNTER — Inpatient Hospital Stay: Payer: Medicare Other

## 2015-04-05 VITALS — BP 109/65 | HR 62 | Temp 97.5°F | Resp 18

## 2015-04-05 DIAGNOSIS — D509 Iron deficiency anemia, unspecified: Secondary | ICD-10-CM

## 2015-04-05 MED ORDER — IRON SUCROSE 20 MG/ML IV SOLN
100.0000 mg | Freq: Once | INTRAVENOUS | Status: AC
Start: 2015-04-05 — End: 2015-04-05
  Administered 2015-04-05: 100 mg via INTRAVENOUS
  Filled 2015-04-05: qty 5

## 2015-04-05 MED ORDER — SODIUM CHLORIDE 0.9 % IV SOLN
Freq: Once | INTRAVENOUS | Status: AC
  Administered 2015-04-05: 10:00:00 via INTRAVENOUS
  Filled 2015-04-05: qty 1000

## 2015-04-12 ENCOUNTER — Inpatient Hospital Stay: Payer: Medicare Other

## 2015-04-13 ENCOUNTER — Inpatient Hospital Stay: Payer: Medicare Other

## 2015-04-13 ENCOUNTER — Other Ambulatory Visit: Payer: Self-pay | Admitting: Hematology and Oncology

## 2015-04-13 VITALS — BP 106/69 | HR 52 | Temp 97.1°F | Resp 18

## 2015-04-13 DIAGNOSIS — D509 Iron deficiency anemia, unspecified: Secondary | ICD-10-CM

## 2015-04-13 MED ORDER — IRON SUCROSE 20 MG/ML IV SOLN
100.0000 mg | Freq: Once | INTRAVENOUS | Status: AC
  Administered 2015-04-13: 100 mg via INTRAVENOUS
  Filled 2015-04-13: qty 5

## 2015-04-13 MED ORDER — SODIUM CHLORIDE 0.9 % IV SOLN
Freq: Once | INTRAVENOUS | Status: AC
  Administered 2015-04-13: 11:00:00 via INTRAVENOUS
  Filled 2015-04-13: qty 1000

## 2015-04-13 MED ORDER — SODIUM CHLORIDE 0.9 % IJ SOLN
10.0000 mL | INTRAMUSCULAR | Status: DC | PRN
  Filled 2015-04-13: qty 10

## 2015-04-13 MED ORDER — HEPARIN SOD (PORK) LOCK FLUSH 100 UNIT/ML IV SOLN: 500.0000 [IU] | Freq: Once | INTRAVENOUS | Status: DC | PRN

## 2015-04-13 MED ORDER — HEPARIN SOD (PORK) LOCK FLUSH 100 UNIT/ML IV SOLN: 250.0000 [IU] | Freq: Once | INTRAVENOUS | Status: DC | PRN

## 2015-04-13 MED ORDER — ALTEPLASE 2 MG IJ SOLR
2.0000 mg | Freq: Once | INTRAMUSCULAR | Status: DC | PRN
  Filled 2015-04-13: qty 2

## 2015-04-13 MED ORDER — SODIUM CHLORIDE 0.9 % IJ SOLN
3.0000 mL | Freq: Once | INTRAMUSCULAR | Status: DC | PRN
  Filled 2015-04-13: qty 10

## 2015-04-14 ENCOUNTER — Ambulatory Visit: Payer: Medicare Other

## 2015-04-19 ENCOUNTER — Inpatient Hospital Stay: Payer: Medicare Other

## 2015-04-19 VITALS — BP 98/62 | HR 62 | Temp 97.4°F | Resp 18

## 2015-04-19 DIAGNOSIS — D509 Iron deficiency anemia, unspecified: Secondary | ICD-10-CM | POA: Diagnosis not present

## 2015-04-19 MED ORDER — SODIUM CHLORIDE 0.9 % IV SOLN
Freq: Once | INTRAVENOUS | Status: AC
  Administered 2015-04-19: 10:00:00 via INTRAVENOUS
  Filled 2015-04-19: qty 1000

## 2015-04-19 MED ORDER — IRON SUCROSE 20 MG/ML IV SOLN
100.0000 mg | Freq: Once | INTRAVENOUS | Status: AC
  Administered 2015-04-19: 100 mg via INTRAVENOUS
  Filled 2015-04-19: qty 5

## 2015-04-26 ENCOUNTER — Inpatient Hospital Stay (HOSPITAL_BASED_OUTPATIENT_CLINIC_OR_DEPARTMENT_OTHER): Payer: Medicare Other | Admitting: Hematology and Oncology

## 2015-04-26 ENCOUNTER — Inpatient Hospital Stay: Payer: Medicare Other | Attending: Hematology and Oncology

## 2015-04-26 ENCOUNTER — Inpatient Hospital Stay: Payer: Medicare Other

## 2015-04-26 VITALS — BP 124/74 | HR 61 | Temp 97.4°F | Wt 127.9 lb

## 2015-04-26 DIAGNOSIS — Z809 Family history of malignant neoplasm, unspecified: Secondary | ICD-10-CM | POA: Insufficient documentation

## 2015-04-26 DIAGNOSIS — D509 Iron deficiency anemia, unspecified: Secondary | ICD-10-CM

## 2015-04-26 DIAGNOSIS — K5900 Constipation, unspecified: Secondary | ICD-10-CM | POA: Diagnosis not present

## 2015-04-26 DIAGNOSIS — F419 Anxiety disorder, unspecified: Secondary | ICD-10-CM | POA: Diagnosis not present

## 2015-04-26 DIAGNOSIS — R55 Syncope and collapse: Secondary | ICD-10-CM | POA: Diagnosis not present

## 2015-04-26 DIAGNOSIS — Z79899 Other long term (current) drug therapy: Secondary | ICD-10-CM | POA: Insufficient documentation

## 2015-04-26 DIAGNOSIS — J45909 Unspecified asthma, uncomplicated: Secondary | ICD-10-CM

## 2015-04-26 DIAGNOSIS — Q431 Hirschsprung's disease: Secondary | ICD-10-CM | POA: Insufficient documentation

## 2015-04-26 LAB — CBC
HCT: 39.5 % (ref 35.0–47.0)
Hemoglobin: 12.9 g/dL (ref 12.0–16.0)
MCH: 29.1 pg (ref 26.0–34.0)
MCHC: 32.8 g/dL (ref 32.0–36.0)
MCV: 88.7 fL (ref 80.0–100.0)
Platelets: 202 10*3/uL (ref 150–440)
RBC: 4.45 MIL/uL (ref 3.80–5.20)
RDW: 18.1 % — ABNORMAL HIGH (ref 11.5–14.5)
WBC: 6.4 10*3/uL (ref 3.6–11.0)

## 2015-04-26 LAB — FERRITIN: Ferritin: 64 ng/mL (ref 11–307)

## 2015-04-26 NOTE — Progress Notes (Signed)
[facility]-  Cancer Center  Clinic day:  04/26/2015  Chief Complaint: TAMMIE YANDA is a 29 y.o. female with with iron deficiency anemia who is seen for reassessment after 6 weeks of Venofer.  HPI: The patient was last seen in the medical oncology clinic on 03/29/2015.  At that time, she was seen for reassessment after initiation of IV iron.  She was tolerating low-dose weekly Venofer (100 mg) with only slight change in her baseline constipation due to Hirshsprung's.    She has continued her IV iron.  After 200 mg of Venofer, ferritin had increased from 5 to 28. She has received a total of 6 weeks (600 mg).  She last received IV iron on 04/19/2015.  Symptomatically, she notes improvement in her energy level.  She no longer has ice pica. She has follow-up with gynecology regarding a possible hysterectomy for heavy menses. Her constipation is at baseline.  Past Medical History  Diagnosis Date  . Anemia   . Syncope, cardiogenic   . Asthma   . Hirschsprung disease   . Anxiety     Past Surgical History  Procedure Laterality Date  . Appendectomy  1997  . Ovarian cyst removal  2003, 2012  . Abdomial adhesions removed  2012  . Bladder repair surgery  1997  . Tonsillectomy  2007  . Enodmetiosis removed  2012  . C- sections  [id number]    Family History  Problem Relation Age of Onset  . Cancer Mother   . Diabetes Mother   . Hypertension Mother   . Cancer Father   . Cancer Maternal Grandfather   . Factor IX deficiency Maternal Grandfather   . Factor IX deficiency Son     Social History:  reports that she has never smoked. She has never used smokeless tobacco. She reports that she does not drink alcohol or use illicit drugs.  The patient is alone today.  Allergies:  Allergies  Allergen Reactions  . Meperidine Anaphylaxis    Other reaction(s): Other (See Comments) Other Reaction: Not Assessed  . Lactose     Other reaction(s): Other (See Comments) Other  Reaction: GI Upset  . Fludrocortisone Hives  . Levofloxacin Hives    Current Medications: Current Outpatient Prescriptions  Medication Sig Dispense Refill  . EPINEPHrine (EPIPEN 2-PAK) 0.3 mg/0.3 mL IJ SOAJ injection Inject into the muscle.    Marland Kitchen Linaclotide (LINZESS) 290 MCG CAPS capsule Take by mouth.    Marland Kitchen MICROGESTIN 1.5-30 MG-MCG tablet   11  . midodrine (PROAMATINE) 10 MG tablet Take by mouth.    Marland Kitchen omeprazole (PRILOSEC) 40 MG capsule TK ONE C PO  QD  0  . ondansetron (ZOFRAN-ODT) 4 MG disintegrating tablet Take by mouth.    . polyethylene glycol (MIRALAX / GLYCOLAX) packet Take by mouth.    . promethazine (PHENERGAN) 25 MG tablet Take by mouth.    . sertraline (ZOLOFT) 100 MG tablet Take by mouth.    . SUMAtriptan (IMITREX) 25 MG tablet   3   No current facility-administered medications for this visit.   Review of Systems:  GENERAL:  Energy level improved.  No fevers, sweats or weight loss. PERFORMANCE STATUS (ECOG):  1 HEENT:  No visual changes, runny nose, sore throat, mouth sores or tenderness. Lungs: No shortness of breath or cough.  No hemoptysis. Cardiac:  No chest pain, palpitations, orthopnea, or PND. GI:  Chronic constipation.  Nausea and vomiting secondary to constipation.  No diarrhea, melena or hematochezia. GU:  No urgency, frequency, dysuria, or hematuria. Musculoskeletal:  No back pain.  No joint pain.  No muscle tenderness. Extremities:  No pain or swelling. Skin:  Hair loss.  Easy bruising.  No rashes or skin changes. Neuro:  No headache, numbness or weakness, balance or coordination issues. Endocrine:  No diabetes, thyroid issues, hot flashes or night sweats. Psych:  No mood changes, depression or anxiety. Pain:  No focal pain. Review of systems:  All other systems reviewed and found to be negative.   Physical Exam: Blood pressure 124/74, pulse 61, temperature 97.4 F (36.3 C), temperature source Tympanic, weight 127 lb 13.9 oz (58.001 kg). GENERAL:   Thin tall young woman sitting comfortably in the exam room in no acute distress. MENTAL STATUS:  Alert and oriented to person, place and time. HEAD:  Long brown hair.  Normocephalic, atraumatic, face symmetric, no Cushingoid features. EYES:  Blue eyes.  Pupils equal round and reactive to light and accomodation.  No conjunctivitis or scleral icterus. ENT:  Oropharynx clear without lesion.  Tongue normal. Mucous membranes moist.  RESPIRATORY:  Clear to auscultation without rales, wheezes or rhonchi. CARDIOVASCULAR:  Regular rate and rhythm without murmur, rub or gallop. ABDOMEN:  Soft, non-tender, with active bowel sounds, and no hepatosplenomegaly.  No masses. SKIN:  No rashes, ulcers or lesions. EXTREMITIES: No edema, no skin discoloration or tenderness.  No palpable cords. LYMPH NODES: No palpable cervical, supraclavicular, axillary or inguinal adenopathy  NEUROLOGICAL: Unremarkable. PSYCH:  Appropriate.   Appointment on 04/26/2015  Component Date Value Ref Range Status  . WBC 04/26/2015 6.4  3.6 - 11.0 K/uL Final  . RBC 04/26/2015 4.45  3.80 - 5.20 MIL/uL Final  . Hemoglobin 04/26/2015 12.9  12.0 - 16.0 g/dL Final  . HCT 16/05/9603 39.5  35.0 - 47.0 % Final  . MCV 04/26/2015 88.7  80.0 - 100.0 fL Final  . MCH 04/26/2015 29.1  26.0 - 34.0 pg Final  . MCHC 04/26/2015 32.8  32.0 - 36.0 g/dL Final  . RDW 54/04/8118 18.1* 11.5 - 14.5 % Final  . Platelets 04/26/2015 202  150 - 440 K/uL Final    Assessment:  KOLLINS FENTER is a 29 y.o. 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  Ferritin was 3 on 10/04/2014 consistent with iron deficiency.  She denies any melena or hematochezia.  She has heavy menses felt secondary to endometriosis.    Work-up on 03/08/2015 confirmed iron deficiency (ferritin 5). B12 and folate were normal. She has received 600 mg of Venofer (03/15/2015 - 04/19/2015) with resolution of symptoms  She has a son  with factor IX deficiency.  Another son is being evaluated for von Willebrand's disease.  Work-up on 03/08/2015 revealed a normal platelet count, PT, PTT, von Willebrand panel, and multimers.  Symptomatically, she feels good.  Exam is unremarkable.  Hematocrit is normal.  Ferritin is 64.  Plan: 1. Labs today:  CBC with diff, ferritin. 2. Nurse to contact patient with today's labs.  Goal ferritin > 50 as hematocrit normal. 3.   RTC in 3 months for MD assess, labs (CBC with diff, ferritin, iron studies), and +/- Venofer.   Rosey Bath, MD  04/26/2015, 9:53 AM

## 2015-04-30 ENCOUNTER — Encounter: Payer: Self-pay | Admitting: Hematology and Oncology

## 2015-05-03 ENCOUNTER — Inpatient Hospital Stay: Payer: Medicare Other

## 2015-05-10 ENCOUNTER — Inpatient Hospital Stay: Payer: Medicare Other

## 2015-05-17 ENCOUNTER — Inpatient Hospital Stay: Payer: Medicare Other

## 2015-07-21 ENCOUNTER — Other Ambulatory Visit: Payer: Self-pay

## 2015-07-21 DIAGNOSIS — D509 Iron deficiency anemia, unspecified: Secondary | ICD-10-CM

## 2015-07-26 ENCOUNTER — Other Ambulatory Visit: Payer: Self-pay

## 2015-07-26 ENCOUNTER — Telehealth: Payer: Self-pay

## 2015-07-26 ENCOUNTER — Inpatient Hospital Stay: Payer: Medicare Other | Attending: Hematology and Oncology

## 2015-07-26 ENCOUNTER — Encounter: Payer: Self-pay | Admitting: Hematology and Oncology

## 2015-07-26 ENCOUNTER — Ambulatory Visit (HOSPITAL_BASED_OUTPATIENT_CLINIC_OR_DEPARTMENT_OTHER): Payer: Medicare Other | Admitting: Hematology and Oncology

## 2015-07-26 VITALS — BP 109/71 | HR 65 | Temp 97.9°F | Resp 18 | Ht 65.0 in | Wt 130.7 lb

## 2015-07-26 DIAGNOSIS — F419 Anxiety disorder, unspecified: Secondary | ICD-10-CM

## 2015-07-26 DIAGNOSIS — J45909 Unspecified asthma, uncomplicated: Secondary | ICD-10-CM | POA: Insufficient documentation

## 2015-07-26 DIAGNOSIS — D509 Iron deficiency anemia, unspecified: Secondary | ICD-10-CM | POA: Diagnosis present

## 2015-07-26 DIAGNOSIS — N920 Excessive and frequent menstruation with regular cycle: Secondary | ICD-10-CM | POA: Insufficient documentation

## 2015-07-26 DIAGNOSIS — Q431 Hirschsprung's disease: Secondary | ICD-10-CM | POA: Insufficient documentation

## 2015-07-26 DIAGNOSIS — F5089 Other specified eating disorder: Secondary | ICD-10-CM | POA: Insufficient documentation

## 2015-07-26 DIAGNOSIS — Z79899 Other long term (current) drug therapy: Secondary | ICD-10-CM | POA: Insufficient documentation

## 2015-07-26 DIAGNOSIS — Z809 Family history of malignant neoplasm, unspecified: Secondary | ICD-10-CM | POA: Diagnosis not present

## 2015-07-26 DIAGNOSIS — R5383 Other fatigue: Secondary | ICD-10-CM

## 2015-07-26 DIAGNOSIS — D649 Anemia, unspecified: Secondary | ICD-10-CM

## 2015-07-26 LAB — IRON AND TIBC
Iron: 120 ug/dL (ref 28–170)
Saturation Ratios: 26 % (ref 10.4–31.8)
TIBC: 468 ug/dL — ABNORMAL HIGH (ref 250–450)
UIBC: 348 ug/dL

## 2015-07-26 LAB — CBC WITH DIFFERENTIAL/PLATELET
Basophils Absolute: 0 10*3/uL (ref 0–0.1)
Basophils Relative: 1 %
Eosinophils Absolute: 0.1 10*3/uL (ref 0–0.7)
Eosinophils Relative: 3 %
HCT: 39.4 % (ref 35.0–47.0)
Hemoglobin: 13 g/dL (ref 12.0–16.0)
Lymphocytes Relative: 31 %
Lymphs Abs: 1.7 10*3/uL (ref 1.0–3.6)
MCH: 30.4 pg (ref 26.0–34.0)
MCHC: 33.1 g/dL (ref 32.0–36.0)
MCV: 92.1 fL (ref 80.0–100.0)
Monocytes Absolute: 0.4 10*3/uL (ref 0.2–0.9)
Monocytes Relative: 8 %
Neutro Abs: 3.1 10*3/uL (ref 1.4–6.5)
Neutrophils Relative %: 57 %
Platelets: 187 10*3/uL (ref 150–440)
RBC: 4.28 MIL/uL (ref 3.80–5.20)
RDW: 14 % (ref 11.5–14.5)
WBC: 5.4 10*3/uL (ref 3.6–11.0)

## 2015-07-26 LAB — FERRITIN: Ferritin: 9 ng/mL — ABNORMAL LOW (ref 11–307)

## 2015-07-26 NOTE — Progress Notes (Signed)
Patient is here for follow-up of IDA. Patient states that over the past month her fatigued has increased and she has started craving ice chips again.

## 2015-07-26 NOTE — Progress Notes (Signed)
Latasha [name] is a 29 y.o. female with with iron deficiency anemia who is seen for 3 month assessment.  HPI: The patient was last seen in the medical oncology clinic on 04/26/2015.  At that time, she was seen for reassessment after 6 weeks of IV iron.  Symptomatically, she noted improvement in her energy level.  She no longer had ice pica.  Labs included a hematocrit of 39.4, hemoglobin 13.0, and MCV 88.7.  Ferritin was 64.  During the interim, she met with gynecology.  She was placed on birth control pills.  She has yet to notice a difference regarding her heavy menses.  She has follow-up with gynecology in 08/2015.  She notes gradual onset of fatigue over the past month.  Ice pica has restarted.  Past Medical History  Diagnosis Date  . Anemia   . Syncope, cardiogenic   . Asthma   . Hirschsprung disease   . Anxiety     Past Surgical History  Procedure Laterality Date  . Appendectomy  1997  . Ovarian cyst removal  2003, 2012  . Abdomial adhesions removed  2012  . Bladder repair surgery  1997  . Tonsillectomy  2007  . Enodmetiosis removed  2012  . C- sections  [id number]    Family History  Problem Relation Age of Onset  . Cancer Mother   . Diabetes Mother   . Hypertension Mother   . Cancer Father   . Cancer Maternal Grandfather   . Factor IX deficiency Maternal Grandfather   . Factor IX deficiency Son     Social History:  reports that she has never smoked. She has never used smokeless tobacco. She reports that she does not drink alcohol or use illicit drugs.  The patient is alone today.  Allergies:  Allergies  Allergen Reactions  . Meperidine Anaphylaxis    Other reaction(s): Other (See Comments) Other Reaction: Not Assessed  . Lactose     Other reaction(s): Other (See Comments) Other Reaction: GI Upset  . Fludrocortisone Hives  . Levofloxacin Hives    Current  Medications: Current Outpatient Prescriptions  Medication Sig Dispense Refill  . EPINEPHrine (EPIPEN 2-PAK) 0.3 mg/0.3 mL IJ SOAJ injection Inject into the muscle.    Marland Kitchen Linaclotide (LINZESS) 290 MCG CAPS capsule Take by mouth.    . midodrine (PROAMATINE) 10 MG tablet Take by mouth.    . norethindrone-ethinyl estradiol (JUNEL 1/20) 1-20 MG-MCG tablet Take 1 tablet by mouth daily.    Marland Kitchen omeprazole (PRILOSEC) 40 MG capsule TK ONE C PO  QD  0  . ondansetron (ZOFRAN-ODT) 4 MG disintegrating tablet Take by mouth.    . polyethylene glycol (MIRALAX / GLYCOLAX) packet Take by mouth.    . promethazine (PHENERGAN) 25 MG tablet Take by mouth.    . SUMAtriptan (IMITREX) 25 MG tablet   3  . sertraline (ZOLOFT) 100 MG tablet Take by mouth.     No current facility-administered medications for this visit.   Review of Systems:  GENERAL:  Energy level decreasing.  No fevers, sweats or weight loss. PERFORMANCE STATUS (ECOG):  1 HEENT:  No visual changes, runny nose, sore throat, mouth sores or tenderness. Lungs: No shortness of breath or cough.  No hemoptysis. Cardiac:  No chest pain, palpitations, orthopnea, or PND. GI:  Chronic constipation.  Nausea and vomiting secondary to constipation.  No diarrhea, melena or hematochezia. GU:  Heavy menses.  No urgency, frequency, dysuria, or hematuria. Musculoskeletal:  No back pain.  No joint pain.  No muscle tenderness. Extremities:  No pain or swelling. Skin:  Hair loss.  Easy bruising.  No rashes or skin changes. Neuro:  No headache, numbness or weakness, balance or coordination issues. Endocrine:  No diabetes, thyroid issues, hot flashes or night sweats. Psych:  No mood changes, depression or anxiety. Pain:  No focal pain. Review of systems:  All other systems reviewed and found to be negative.   Physical Exam: Blood pressure 109/71, pulse 65, temperature 97.9 F (36.6 C), temperature source Tympanic, resp. rate 18, height '5\' 5"'  (1.651 m), weight 130 lb  11.7 oz (59.3 kg). GENERAL:  Thin tall young woman sitting comfortably in the exam room in no acute distress. MENTAL STATUS:  Alert and oriented to person, place and time. HEAD:  Long brown hair.  Normocephalic, atraumatic, face symmetric, no Cushingoid features. EYES:  Blue eyes.  Pupils equal round and reactive to light and accomodation.  No conjunctivitis or scleral icterus. ENT:  Oropharynx clear without lesion.  Tongue normal. Mucous membranes moist.  RESPIRATORY:  Clear to auscultation without rales, wheezes or rhonchi. CARDIOVASCULAR:  Regular rate and rhythm without murmur, rub or gallop. ABDOMEN:  Soft, non-tender, with active bowel sounds, and no hepatosplenomegaly.  No masses. SKIN:  No rashes, ulcers or lesions. EXTREMITIES: No edema, no skin discoloration or tenderness.  No palpable cords. LYMPH NODES: No palpable cervical, supraclavicular, axillary or inguinal adenopathy  NEUROLOGICAL: Unremarkable. PSYCH:  Appropriate.   Appointment on 07/26/2015  Component Date Value Ref Range Status  . WBC 07/26/2015 5.4  3.6 - 11.0 K/uL Final  . RBC 07/26/2015 4.28  3.80 - 5.20 MIL/uL Final  . Hemoglobin 07/26/2015 13.0  12.0 - 16.0 g/dL Final  . HCT 07/26/2015 39.4  35.0 - 47.0 % Final  . MCV 07/26/2015 92.1  80.0 - 100.0 fL Final  . MCH 07/26/2015 30.4  26.0 - 34.0 pg Final  . MCHC 07/26/2015 33.1  32.0 - 36.0 g/dL Final  . RDW 07/26/2015 14.0  11.5 - 14.5 % Final  . Platelets 07/26/2015 187  150 - 440 K/uL Final  . Neutrophils Relative % 07/26/2015 57   Final  . Neutro Abs 07/26/2015 3.1  1.4 - 6.5 K/uL Final  . Lymphocytes Relative 07/26/2015 31   Final  . Lymphs Abs 07/26/2015 1.7  1.0 - 3.6 K/uL Final  . Monocytes Relative 07/26/2015 8   Final  . Monocytes Absolute 07/26/2015 0.4  0.2 - 0.9 K/uL Final  . Eosinophils Relative 07/26/2015 3   Final  . Eosinophils Absolute 07/26/2015 0.1  0 - 0.7 K/uL Final  . Basophils Relative 07/26/2015 1   Final  . Basophils Absolute  07/26/2015 0.0  0 - 0.1 K/uL Final    Assessment:  Latasha [name] is a 29 y.o. 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  Ferritin was 3 on 10/04/2014 consistent with iron deficiency.  She denies any melena or hematochezia.  She has heavy menses felt secondary to endometriosis.    Work-up on 03/08/2015 confirmed iron deficiency (ferritin 5). B12 and folate were normal. She has received 600 mg of Venofer (03/15/2015 - 04/19/2015) with resolution of symptoms.  Ferritin was 64 on 04/26/2015.  She has a son with factor IX deficiency.  Another son is being evaluated for von Willebrand's disease.  Work-up on 03/08/2015 revealed a normal platelet count, PT, PTT, von Willebrand  panel, and multimers.  Within the past month, she has become fatigued with return of ice pica.  She has been on birth control pills for 1 month.  Menses have not decreased.  Exam is unremarkable.  Hematocrit and MCV are normal.   Plan: 1. Labs today:  CBC with diff, ferritin, iron studies. 2. Discuss plan for Venofer 100-200 mg if ferritin < 50 secondary to symptoms 3.   RTC in 3 months for MD assess, labs (CBC with diff, ferritin), and +/- Venofer.   Lequita Asal, MD  07/26/2015, 9:58 AM

## 2015-07-26 NOTE — Telephone Encounter (Signed)
Called and spoke with pt per MD informed pt that her ferritin was 9 and we will have scheduling call her and set up a time to have 3-4 infusions per md.  Pt verbalized an understanding.

## 2015-08-09 ENCOUNTER — Inpatient Hospital Stay: Payer: Medicare Other

## 2015-08-09 VITALS — BP 109/73 | HR 60 | Temp 98.4°F | Resp 20

## 2015-08-09 DIAGNOSIS — D509 Iron deficiency anemia, unspecified: Secondary | ICD-10-CM | POA: Diagnosis not present

## 2015-08-09 MED ORDER — HEPARIN SOD (PORK) LOCK FLUSH 100 UNIT/ML IV SOLN: 500.0000 [IU] | Freq: Once | INTRAVENOUS | Status: DC | PRN

## 2015-08-09 MED ORDER — SODIUM CHLORIDE 0.9 % IJ SOLN
10.0000 mL | INTRAMUSCULAR | Status: DC | PRN
  Filled 2015-08-09: qty 10

## 2015-08-09 MED ORDER — ALTEPLASE 2 MG IJ SOLR
2.0000 mg | Freq: Once | INTRAMUSCULAR | Status: DC | PRN
  Filled 2015-08-09: qty 2

## 2015-08-09 MED ORDER — HEPARIN SOD (PORK) LOCK FLUSH 100 UNIT/ML IV SOLN: 250.0000 [IU] | Freq: Once | INTRAVENOUS | Status: DC | PRN

## 2015-08-09 MED ORDER — SODIUM CHLORIDE 0.9 % IJ SOLN
3.0000 mL | Freq: Once | INTRAMUSCULAR | Status: DC | PRN
  Filled 2015-08-09: qty 10

## 2015-08-09 MED ORDER — SODIUM CHLORIDE 0.9 % IV SOLN
100.0000 mg | Freq: Once | INTRAVENOUS | Status: AC
  Administered 2015-08-09: 100 mg via INTRAVENOUS
  Filled 2015-08-09: qty 5

## 2015-08-16 ENCOUNTER — Inpatient Hospital Stay: Payer: Medicare Other

## 2015-08-16 VITALS — BP 104/62 | HR 62 | Temp 98.3°F | Resp 20

## 2015-08-16 DIAGNOSIS — D509 Iron deficiency anemia, unspecified: Secondary | ICD-10-CM

## 2015-08-16 MED ORDER — SODIUM CHLORIDE 0.9 % IV SOLN
100.0000 mg | Freq: Once | INTRAVENOUS | Status: AC
  Administered 2015-08-16: 100 mg via INTRAVENOUS
  Filled 2015-08-16: qty 5

## 2015-08-23 ENCOUNTER — Inpatient Hospital Stay: Payer: Medicare Other

## 2015-08-23 ENCOUNTER — Other Ambulatory Visit: Payer: Self-pay | Admitting: Hematology and Oncology

## 2015-08-23 ENCOUNTER — Inpatient Hospital Stay: Payer: Medicare Other | Attending: Hematology and Oncology

## 2015-08-23 VITALS — BP 111/68 | HR 65 | Temp 98.5°F | Resp 20

## 2015-08-23 DIAGNOSIS — D509 Iron deficiency anemia, unspecified: Secondary | ICD-10-CM | POA: Insufficient documentation

## 2015-08-23 LAB — CBC WITH DIFFERENTIAL/PLATELET
Basophils Absolute: 0 10*3/uL (ref 0–0.1)
Basophils Relative: 1 %
Eosinophils Absolute: 0.1 10*3/uL (ref 0–0.7)
Eosinophils Relative: 2 %
HCT: 39.4 % (ref 35.0–47.0)
Hemoglobin: 13 g/dL (ref 12.0–16.0)
Lymphocytes Relative: 25 %
Lymphs Abs: 1.4 10*3/uL (ref 1.0–3.6)
MCH: 31 pg (ref 26.0–34.0)
MCHC: 33.2 g/dL (ref 32.0–36.0)
MCV: 93.4 fL (ref 80.0–100.0)
Monocytes Absolute: 0.4 10*3/uL (ref 0.2–0.9)
Monocytes Relative: 7 %
Neutro Abs: 3.8 10*3/uL (ref 1.4–6.5)
Neutrophils Relative %: 67 %
Platelets: 184 10*3/uL (ref 150–440)
RBC: 4.21 MIL/uL (ref 3.80–5.20)
RDW: 13.9 % (ref 11.5–14.5)
WBC: 5.8 10*3/uL (ref 3.6–11.0)

## 2015-08-23 LAB — FERRITIN: Ferritin: 40 ng/mL (ref 11–307)

## 2015-08-23 NOTE — Progress Notes (Unsigned)
Pt was on schedule for Venofer today. MD states pt does not need the Tx today. IV discontinued. Pt had lab work today. Will call pt with ferritin results.

## 2015-08-29 ENCOUNTER — Other Ambulatory Visit: Payer: Self-pay | Admitting: *Deleted

## 2015-08-29 DIAGNOSIS — D509 Iron deficiency anemia, unspecified: Secondary | ICD-10-CM

## 2015-08-30 ENCOUNTER — Inpatient Hospital Stay: Payer: Medicare Other

## 2015-09-19 DIAGNOSIS — K599 Functional intestinal disorder, unspecified: Secondary | ICD-10-CM | POA: Insufficient documentation

## 2015-09-19 DIAGNOSIS — M6289 Other specified disorders of muscle: Secondary | ICD-10-CM | POA: Insufficient documentation

## 2015-09-20 ENCOUNTER — Inpatient Hospital Stay: Payer: Medicare Other | Attending: Hematology and Oncology

## 2015-09-20 DIAGNOSIS — D509 Iron deficiency anemia, unspecified: Secondary | ICD-10-CM | POA: Diagnosis not present

## 2015-09-20 LAB — IRON AND TIBC
Iron: 139 ug/dL (ref 28–170)
Saturation Ratios: 34 % — ABNORMAL HIGH (ref 10.4–31.8)
TIBC: 406 ug/dL (ref 250–450)
UIBC: 267 ug/dL

## 2015-09-20 LAB — FERRITIN: Ferritin: 35 ng/mL (ref 11–307)

## 2015-10-25 ENCOUNTER — Inpatient Hospital Stay (HOSPITAL_BASED_OUTPATIENT_CLINIC_OR_DEPARTMENT_OTHER): Payer: Medicare Other | Admitting: Hematology and Oncology

## 2015-10-25 ENCOUNTER — Inpatient Hospital Stay: Payer: Medicare Other | Attending: Hematology and Oncology

## 2015-10-25 VITALS — BP 114/69 | HR 75 | Temp 98.5°F | Wt 125.7 lb

## 2015-10-25 DIAGNOSIS — K5909 Other constipation: Secondary | ICD-10-CM

## 2015-10-25 DIAGNOSIS — J45909 Unspecified asthma, uncomplicated: Secondary | ICD-10-CM | POA: Diagnosis not present

## 2015-10-25 DIAGNOSIS — Z79899 Other long term (current) drug therapy: Secondary | ICD-10-CM | POA: Insufficient documentation

## 2015-10-25 DIAGNOSIS — D509 Iron deficiency anemia, unspecified: Secondary | ICD-10-CM | POA: Diagnosis not present

## 2015-10-25 DIAGNOSIS — D649 Anemia, unspecified: Secondary | ICD-10-CM

## 2015-10-25 DIAGNOSIS — F419 Anxiety disorder, unspecified: Secondary | ICD-10-CM

## 2015-10-25 LAB — CBC WITH DIFFERENTIAL/PLATELET
Basophils Absolute: 0 10*3/uL (ref 0–0.1)
Basophils Relative: 1 %
Eosinophils Absolute: 0.2 10*3/uL (ref 0–0.7)
Eosinophils Relative: 2 %
HCT: 42.2 % (ref 35.0–47.0)
Hemoglobin: 14.2 g/dL (ref 12.0–16.0)
Lymphocytes Relative: 21 %
Lymphs Abs: 1.8 10*3/uL (ref 1.0–3.6)
MCH: 30.9 pg (ref 26.0–34.0)
MCHC: 33.6 g/dL (ref 32.0–36.0)
MCV: 92 fL (ref 80.0–100.0)
Monocytes Absolute: 0.5 10*3/uL (ref 0.2–0.9)
Monocytes Relative: 6 %
Neutro Abs: 6.1 10*3/uL (ref 1.4–6.5)
Neutrophils Relative %: 70 %
Platelets: 190 10*3/uL (ref 150–440)
RBC: 4.59 MIL/uL (ref 3.80–5.20)
RDW: 12.6 % (ref 11.5–14.5)
WBC: 8.7 10*3/uL (ref 3.6–11.0)

## 2015-10-25 LAB — FERRITIN: Ferritin: 29 ng/mL (ref 11–307)

## 2015-10-25 NOTE — Progress Notes (Signed)
Follow up IDA. Here for possible feraheme infusions. Beginning to feel more fatigued. Started on BCP's in Nov which helped slow down her menstral cycles. Had a colonoscopy on Monday. Has appt with surgeon Tomm to discuss colon resection and ostomy pacement. Continues to take 7 miralax pills per day as her bowel does not function properly.

## 2015-10-27 ENCOUNTER — Telehealth: Payer: Self-pay

## 2015-10-27 NOTE — Telephone Encounter (Signed)
Pt returned phone call.  I informed pt that Dr. Merlene Pullingorcoran thought her labs looked ok to not get venofer.  Per pt she is feeling really tired again and that before we gave venofer for ferritin under 50.  I told pt I would talk with MD and call her back.    Per Dr. Merlene Pullingorcoran set pt up for venofer x1   Will inform Vernona RiegerLaura to call and schedule  Called pt right back and informed her of Dr. Ellan Lambertorcorans plan of care and pt verbalized an understanding.  No other concerns noted.

## 2015-10-29 ENCOUNTER — Encounter: Payer: Self-pay | Admitting: Hematology and Oncology

## 2015-10-29 NOTE — Progress Notes (Signed)
[facility]  Clinic day:  10/25/2015  Chief Complaint: Latasha [name] is a 30 y.o. 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  HPI: The patient was last seen in the medical oncology clinic on 07/26/2015.  At that time, she was seen for seen for 3 month assessment.  Symptomatically,  she had become fatigued with return of ice pica.  She had been on birth control pills for 1 month.  Menses had not decreased.  Exam was unremarkable.  Hematocrit and MCV were normal. Ferritin was 9.  As decision was made to receive IV iron if her ferritin was < 50, she received Venofer 100 mg IV on 08/09/2015 and 08/16/2015.  Ferritin was 40 on 08/23/2015.  [facility] the [facility], [facility]).  She notes ice cravings again.  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  [facility]  Past Medical History  Diagnosis Date  . Anemia   . Syncope, cardiogenic   . Asthma   . Hirschsprung disease   . Anxiety     Past Surgical History  Procedure Laterality Date  . Appendectomy  1997  . Ovarian cyst removal  2003, 2012  . Abdomial adhesions removed  2012  . Bladder repair surgery  1997  . Tonsillectomy  2007  . Enodmetiosis removed  2012  . C- sections  [id number]    Family History  Problem Relation Age of Onset  . Cancer Mother   . Diabetes Mother   . Hypertension Mother   . Cancer Father   . Cancer Maternal Grandfather   . Factor IX deficiency Maternal Grandfather   . Factor IX deficiency Son     Social History:  reports that she has never smoked. She has never used smokeless tobacco. She reports that she does not drink alcohol or use illicit drugs.  The patient is accompanied by her husband today.  Allergies:  Allergies  Allergen  Reactions  . Meperidine Anaphylaxis    Other reaction(s): Other (See Comments) Other Reaction: Not Assessed  . Lactose     Other reaction(s): Other (See Comments) Other Reaction: GI Upset  . Fludrocortisone Hives  . Levofloxacin Hives    Current Medications: Current Outpatient Prescriptions  Medication Sig Dispense Refill  . EPINEPHrine (EPIPEN 2-PAK) 0.3 mg/0.3 mL IJ SOAJ injection Inject into the muscle.    Marland Kitchen. Linaclotide (LINZESS) 290 MCG CAPS capsule Take by mouth.    . midodrine (PROAMATINE) 10 MG tablet Take by mouth.    . norethindrone-ethinyl estradiol (JUNEL 1/20) 1-20 MG-MCG tablet Take 1 tablet by mouth daily.    Marland Kitchen. omeprazole (PRILOSEC) 40 MG capsule TK ONE C PO  QD  0  . ondansetron (ZOFRAN-ODT) 4 MG disintegrating tablet Take by mouth.    . polyethylene glycol (MIRALAX / GLYCOLAX) packet Take by mouth.    . promethazine (PHENERGAN) 25 MG tablet Take by mouth.    . sertraline (ZOLOFT) 100 MG tablet Take 100 mg by mouth daily.    . SUMAtriptan (IMITREX) 25 MG tablet   3  . sertraline (ZOLOFT) 100 MG tablet Take 100 mg by mouth daily.      No current facility-administered medications for this visit.   Review of Systems:  GENERAL:  Energy level down a little.  No fevers, sweats  or weight loss. PERFORMANCE STATUS (ECOG):  1 HEENT:  No visual changes, runny nose, sore throat, mouth sores or tenderness. Lungs: No shortness of breath or cough.  No hemoptysis. Cardiac:  No chest pain, palpitations, orthopnea, or PND. GI:  Chronic constipation.  Nausea and vomiting secondary to constipation.  Colostomy being considered.  No diarrhea, melena or hematochezia. GU:  Menses light on BCPs.  No urgency, frequency, dysuria, or hematuria. Musculoskeletal:  No back pain.  No joint pain.  No muscle tenderness. Extremities:  No pain or swelling. Skin:  Easy bruising.  No rashes or skin changes. Neuro:  No headache, numbness or weakness, balance or coordination issues. Endocrine:  No  diabetes, thyroid issues, hot flashes or night sweats. Psych:  No mood changes, depression or anxiety. Pain:  No focal pain. Review of systems:  All other systems reviewed and found to be negative.   Physical Exam: Blood pressure 114/69, pulse 75, temperature 98.5 F (36.9 C), temperature source Tympanic, weight 125 lb 10.6 oz (57 kg), SpO2 99 %. GENERAL:  Thin tall young woman sitting comfortably in the exam room in no acute distress. MENTAL STATUS:  Alert and oriented to person, place and time. HEAD:  Long brown hair.  Normocephalic, atraumatic, face symmetric, no Cushingoid features. EYES:  Blue eyes.  Pupils equal round and reactive to light and accomodation.  No conjunctivitis or scleral icterus. ENT:  Oropharynx clear without lesion.  Tongue normal. Mucous membranes moist.  RESPIRATORY:  Clear to auscultation without rales, wheezes or rhonchi. CARDIOVASCULAR:  Regular rate and rhythm without murmur, rub or gallop. ABDOMEN:  Soft, non-tender, with active bowel sounds, and no hepatosplenomegaly.  No masses. SKIN:  No rashes, ulcers or lesions. EXTREMITIES: No edema, no skin discoloration or tenderness.  No palpable cords. LYMPH NODES: No palpable cervical, supraclavicular, axillary or inguinal adenopathy  NEUROLOGICAL: Unremarkable. PSYCH:  Appropriate.   Appointment on 10/25/2015  Component Date Value Ref Range Status  . WBC 10/25/2015 8.7  3.6 - 11.0 K/uL Final  . RBC 10/25/2015 4.59  3.80 - 5.20 MIL/uL Final  . Hemoglobin 10/25/2015 14.2  12.0 - 16.0 g/dL Final  . HCT 16/05/9603 42.2  35.0 - 47.0 % Final  . MCV 10/25/2015 92.0  80.0 - 100.0 fL Final  . MCH 10/25/2015 30.9  26.0 - 34.0 pg Final  . MCHC 10/25/2015 33.6  32.0 - 36.0 g/dL Final  . RDW 54/04/8118 12.6  11.5 - 14.5 % Final  . Platelets 10/25/2015 190  150 - 440 K/uL Final  . Neutrophils Relative % 10/25/2015 70   Final  . Neutro Abs 10/25/2015 6.1  1.4 - 6.5 K/uL Final  . Lymphocytes Relative 10/25/2015 21   Final   . Lymphs Abs 10/25/2015 1.8  1.0 - 3.6 K/uL Final  . Monocytes Relative 10/25/2015 6   Final  . Monocytes Absolute 10/25/2015 0.5  0.2 - 0.9 K/uL Final  . Eosinophils Relative 10/25/2015 2   Final  . Eosinophils Absolute 10/25/2015 0.2  0 - 0.7 K/uL Final  . Basophils Relative 10/25/2015 1   Final  . Basophils Absolute 10/25/2015 0.0  0 - 0.1 K/uL Final  . Ferritin 10/25/2015 29  11 - 307 ng/mL Final    Assessment:  Latasha [name] is a 30 y.o. 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  Ferritin was 3 on 10/04/2014 consistent with iron deficiency.  She denies any melena or hematochezia.  She has heavy menses felt  secondary to endometriosis.    Work-up on 03/08/2015 confirmed iron deficiency (ferritin 5). B12 and folate were normal. She has received 600 mg of Venofer (03/15/2015 - 04/19/2015) with resolution of symptoms.  Ferritin was 64 on 04/26/2015.  She received Venofer 200 mg (08/09/2015 - 08/16/2015) secondary to return of symptoms.  Hematocrit was 39.4, MCV 92.1, ferritin 9 (low), and TIBC 468 (high).  She receives Venofer if her ferritin is < 50 with symptoms.  She has a son with factor IX deficiency.  Another son is being evaluated for von Willebrand's disease.  Work-up on 03/08/2015 revealed a normal platelet count, PT, PTT, von Willebrand panel, and multimers.  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  Symptomatically, she has return of ice pica.  She has been on birth control pills since 06/2015.  Menses are now light.  Exam is unremarkable.  Hematocrit and MCV are normal.  Ferritin is 29.  Plan: 1.  Labs today:  CBC with diff, ferritin. 2.  Discuss plan for Venofer 100-200 mg if ferritin < 50 secondary to symptoms 3.  Nurse to call patient with ferritin results. 4.  RTC in 3 months for MD assess, labs (CBC with diff, ferritin, iron studies), and +/-  Venofer.  Addendum:  Patient contacted regarding labs.  She wishes to proceed with Venofer.   Rosey Bath, MD  10/25/2015

## 2015-10-31 ENCOUNTER — Inpatient Hospital Stay: Payer: Medicare Other

## 2015-10-31 VITALS — BP 122/76 | HR 66 | Temp 97.1°F | Resp 20

## 2015-10-31 DIAGNOSIS — D509 Iron deficiency anemia, unspecified: Secondary | ICD-10-CM | POA: Diagnosis not present

## 2015-10-31 MED ORDER — SODIUM CHLORIDE 0.9 % IV SOLN
Freq: Once | INTRAVENOUS | Status: AC
  Administered 2015-10-31: 10:00:00 via INTRAVENOUS
  Filled 2015-10-31: qty 1000

## 2015-10-31 MED ORDER — SODIUM CHLORIDE 0.9 % IV SOLN
100.0000 mg | Freq: Once | INTRAVENOUS | Status: AC
  Administered 2015-10-31: 100 mg via INTRAVENOUS
  Filled 2015-10-31: qty 5

## 2016-01-24 ENCOUNTER — Ambulatory Visit: Payer: Medicaid Other | Admitting: Hematology and Oncology

## 2016-01-24 ENCOUNTER — Other Ambulatory Visit: Payer: Medicaid Other

## 2016-01-24 ENCOUNTER — Other Ambulatory Visit: Payer: Self-pay | Admitting: Hematology and Oncology

## 2016-02-06 ENCOUNTER — Other Ambulatory Visit: Payer: Self-pay

## 2016-02-06 DIAGNOSIS — D509 Iron deficiency anemia, unspecified: Secondary | ICD-10-CM

## 2016-02-07 ENCOUNTER — Inpatient Hospital Stay: Payer: Medicare Other | Attending: Hematology and Oncology

## 2016-02-07 ENCOUNTER — Encounter: Payer: Self-pay | Admitting: Hematology and Oncology

## 2016-02-07 ENCOUNTER — Inpatient Hospital Stay (HOSPITAL_BASED_OUTPATIENT_CLINIC_OR_DEPARTMENT_OTHER): Payer: Medicare Other | Admitting: Hematology and Oncology

## 2016-02-07 ENCOUNTER — Other Ambulatory Visit: Payer: Self-pay | Admitting: Hematology and Oncology

## 2016-02-07 VITALS — BP 108/71 | HR 97 | Temp 96.6°F | Resp 16 | Ht 65.0 in | Wt 128.0 lb

## 2016-02-07 DIAGNOSIS — F5089 Other specified eating disorder: Secondary | ICD-10-CM | POA: Insufficient documentation

## 2016-02-07 DIAGNOSIS — R55 Syncope and collapse: Secondary | ICD-10-CM | POA: Diagnosis not present

## 2016-02-07 DIAGNOSIS — F419 Anxiety disorder, unspecified: Secondary | ICD-10-CM | POA: Diagnosis not present

## 2016-02-07 DIAGNOSIS — Q431 Hirschsprung's disease: Secondary | ICD-10-CM

## 2016-02-07 DIAGNOSIS — Z79899 Other long term (current) drug therapy: Secondary | ICD-10-CM

## 2016-02-07 DIAGNOSIS — Z809 Family history of malignant neoplasm, unspecified: Secondary | ICD-10-CM | POA: Insufficient documentation

## 2016-02-07 DIAGNOSIS — J45909 Unspecified asthma, uncomplicated: Secondary | ICD-10-CM | POA: Diagnosis not present

## 2016-02-07 DIAGNOSIS — D649 Anemia, unspecified: Secondary | ICD-10-CM

## 2016-02-07 DIAGNOSIS — D509 Iron deficiency anemia, unspecified: Secondary | ICD-10-CM | POA: Diagnosis not present

## 2016-02-07 LAB — CBC WITH DIFFERENTIAL/PLATELET
Basophils Absolute: 0 10*3/uL (ref 0–0.1)
Basophils Relative: 1 %
Eosinophils Absolute: 0.1 10*3/uL (ref 0–0.7)
Eosinophils Relative: 2 %
HCT: 42.9 % (ref 35.0–47.0)
Hemoglobin: 14.3 g/dL (ref 12.0–16.0)
Lymphocytes Relative: 31 %
Lymphs Abs: 2.2 10*3/uL (ref 1.0–3.6)
MCH: 30.9 pg (ref 26.0–34.0)
MCHC: 33.3 g/dL (ref 32.0–36.0)
MCV: 92.7 fL (ref 80.0–100.0)
Monocytes Absolute: 0.6 10*3/uL (ref 0.2–0.9)
Monocytes Relative: 8 %
Neutro Abs: 4.2 10*3/uL (ref 1.4–6.5)
Neutrophils Relative %: 58 %
Platelets: 196 10*3/uL (ref 150–440)
RBC: 4.63 MIL/uL (ref 3.80–5.20)
RDW: 13.1 % (ref 11.5–14.5)
WBC: 7.2 10*3/uL (ref 3.6–11.0)

## 2016-02-07 LAB — IRON AND TIBC
Iron: 128 ug/dL (ref 28–170)
Saturation Ratios: 30 % (ref 10.4–31.8)
TIBC: 423 ug/dL (ref 250–450)
UIBC: 295 ug/dL

## 2016-02-07 LAB — FERRITIN: Ferritin: 32 ng/mL (ref 11–307)

## 2016-02-07 NOTE — Progress Notes (Signed)
Pt reports having BM once every 2 weeks.  Repots craving ice more, Fatigue and hair loss.

## 2016-02-07 NOTE — Progress Notes (Signed)
[place] Clinic day:  02/07/2016   Chief Complaint: LUCAS EXLINE is a 30 y.o. 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  HPI: The patient was last seen in the medical oncology clinic on 10/25/2015.  At that time, she had return of ice pica.  She had been on birth control pills since 06/2015.  Menses were light.  Exam was unremarkable.  Hematocrit and MCV were normal.  Ferritin was 29.  As her ferritin was < 50, she received Venofer on 10/31/2015.  She met with the surgeons at [facility].  She was not felt to be a good candidate for surgery.   She notes that her ice pica is back.  Her hair is thinning.  She denies any melena or hematochezia.   Past Medical History  Diagnosis Date  . Anemia   . Syncope, cardiogenic   . Asthma   . Hirschsprung disease   . Anxiety     Past Surgical History  Procedure Laterality Date  . Appendectomy  1997  . Ovarian cyst removal  2003, 2012  . Abdomial adhesions removed  2012  . Bladder repair surgery  1997  . Tonsillectomy  2007  . Enodmetiosis removed  2012  . C- sections  [id number]    Family History  Problem Relation Age of Onset  . Cancer Mother   . Diabetes Mother   . Hypertension Mother   . Cancer Father   . Cancer Maternal Grandfather   . Factor IX deficiency Maternal Grandfather   . Factor IX deficiency Son     Social History:  reports that she has never smoked. She has never used smokeless tobacco. She reports that she does not drink alcohol or use illicit drugs.  The patient is accompanied by her husband today.  Allergies:  Allergies  Allergen Reactions  . Meperidine Anaphylaxis    Other reaction(s): Other (See Comments) Other Reaction: Not Assessed  . Lactose     Other reaction(s): Other (See Comments) Other Reaction: GI Upset  . Fludrocortisone Hives  . Levofloxacin Hives    Current Medications: Current Outpatient  Prescriptions  Medication Sig Dispense Refill  . albuterol (PROAIR HFA) 108 (90 Base) MCG/ACT inhaler Inhale into the lungs.    . hyoscyamine (LEVSIN/SL) 0.125 MG SL tablet Take 0.125 mg by mouth.    Marland Kitchen Linaclotide (LINZESS) 290 MCG CAPS capsule Take by mouth.    . midodrine (PROAMATINE) 10 MG tablet Take by mouth.    . norethindrone-ethinyl estradiol (JUNEL 1/20) 1-20 MG-MCG tablet Take 1 tablet by mouth daily.    Marland Kitchen omeprazole (PRILOSEC) 40 MG capsule TK ONE C PO  QD  0  . ondansetron (ZOFRAN) 4 MG tablet Take 4 mg by mouth.    . polyethylene glycol (MIRALAX / GLYCOLAX) packet Take by mouth.    . Probiotic Product (CULTURELLE IMMUNE DEFENSE PO) Take by mouth.    . prochlorperazine (COMPAZINE) 10 MG tablet Take 10 mg by mouth.    . promethazine (PHENERGAN) 25 MG tablet Take by mouth.    Marland Kitchen REXULTI 1 MG TABS TK 1 T PO QHS  1  . sertraline (ZOLOFT) 100 MG tablet Take 100 mg by mouth daily.    . Sodium Phosphates (RA ENEMA) 7-19 GM/118ML ENEM Place rectally.    . SUMAtriptan (IMITREX) 25 MG tablet   3  . EPINEPHrine (EPIPEN 2-PAK) 0.3 mg/0.3 mL IJ SOAJ injection Inject into the  muscle. Reported on 02/07/2016    . sertraline (ZOLOFT) 100 MG tablet Take 100 mg by mouth daily.      No current facility-administered medications for this visit.   Review of Systems:  GENERAL:  Energy level "ok".  Fatigue.  No fevers or sweats.  Weight up 2 pounds. PERFORMANCE STATUS (ECOG):  1 HEENT:  No visual changes, runny nose, sore throat, mouth sores or tenderness. Lungs: No shortness of breath or cough.  No hemoptysis. Cardiac:  No chest pain, palpitations, orthopnea, or PND. GI:  Chronic constipation.  Nausea and vomiting secondary to constipation.  No diarrhea, melena or hematochezia. GU:  Menses light on BCPs.  No urgency, frequency, dysuria, or hematuria. Musculoskeletal:  No back pain.  No joint pain.  No muscle tenderness. Extremities:  No pain or swelling. Skin:  Easy bruising.  Hair thinning/loss.   No rashes or skin changes. Neuro:  No headache, numbness or weakness, balance or coordination issues. Endocrine:  No diabetes, thyroid issues, hot flashes or night sweats. Psych:  No mood changes, depression or anxiety. Pain:  No focal pain. Review of systems:  All other systems reviewed and found to be negative.   Physical Exam: Blood pressure 108/71, pulse 97, temperature 96.6 F (35.9 C), temperature source Tympanic, resp. rate 16, height _0  (1.651 m), weight 127 lb 15.6 oz (58.05 kg). GENERAL:  Thin tall young woman sitting comfortably in the exam room in no acute distress. MENTAL STATUS:  Alert and oriented to person, place and time. HEAD:  Long brown hair.  Normocephalic, atraumatic, face symmetric, no Cushingoid features. EYES:  Blue eyes.  Pupils equal round and reactive to light and accomodation.  No conjunctivitis or scleral icterus. ENT:  Oropharynx clear without lesion.  Tongue normal. Mucous membranes moist.  RESPIRATORY:  Clear to auscultation without rales, wheezes or rhonchi. CARDIOVASCULAR:  Regular rate and rhythm without murmur, rub or gallop. ABDOMEN:  Soft, non-tender, with active bowel sounds, and no hepatosplenomegaly.  No masses. SKIN:  No rashes, ulcers or lesions. EXTREMITIES: No edema, no skin discoloration or tenderness.  No palpable cords. LYMPH NODES: No palpable cervical, supraclavicular, axillary or inguinal adenopathy  NEUROLOGICAL: Unremarkable. PSYCH:  Appropriate.    Appointment on 02/07/2016  Component Date Value Ref Range Status  . WBC 02/07/2016 7.2  3.6 - 11.0 K/uL Final  . RBC 02/07/2016 4.63  3.80 - 5.20 MIL/uL Final  . Hemoglobin 02/07/2016 14.3  12.0 - 16.0 g/dL Final  . HCT 02/07/2016 42.9  35.0 - 47.0 % Final  . MCV 02/07/2016 92.7  80.0 - 100.0 fL Final  . MCH 02/07/2016 30.9  26.0 - 34.0 pg Final  . MCHC 02/07/2016 33.3  32.0 - 36.0 g/dL Final  . RDW 02/07/2016 13.1  11.5 - 14.5 % Final  . Platelets 02/07/2016 196  150 - 440 K/uL  Final  . Neutrophils Relative % 02/07/2016 58   Final  . Neutro Abs 02/07/2016 4.2  1.4 - 6.5 K/uL Final  . Lymphocytes Relative 02/07/2016 31   Final  . Lymphs Abs 02/07/2016 2.2  1.0 - 3.6 K/uL Final  . Monocytes Relative 02/07/2016 8   Final  . Monocytes Absolute 02/07/2016 0.6  0.2 - 0.9 K/uL Final  . Eosinophils Relative 02/07/2016 2   Final  . Eosinophils Absolute 02/07/2016 0.1  0 - 0.7 K/uL Final  . Basophils Relative 02/07/2016 1   Final  . Basophils Absolute 02/07/2016 0.0  0 - 0.1 K/uL Final    Assessment:  RHEBA DIAMOND is a 30 y.o. 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  Ferritin was 3 on 10/04/2014 consistent with iron deficiency.  She denies any melena or hematochezia.  She has heavy menses felt secondary to endometriosis.    Work-up on 03/08/2015 confirmed iron deficiency (ferritin 5). B12 and folate were normal.   She has received 600 mg of Venofer (03/15/2015 - 04/19/2015) with resolution of symptoms.  Ferritin was 64 on 04/26/2015.  She received Venofer x 2 (08/09/2015 - 08/16/2015) and x 1 (10/31/2015) secondary to return of symptoms.  Hematocrit was 39.4, MCV 92.1, ferritin 9 (low), and TIBC 468 (high) on 07/26/2015.  She receives Venofer if her ferritin is < 50 with symptoms.  She has a son with factor IX deficiency.  Another son is being evaluated for von Willebrand's disease.  Work-up on 03/08/2015 revealed a normal platelet count, PT, PTT, von Willebrand panel, and multimers.  Symptomatically, she has return of ice pica.  She has been on birth control pills since 06/2015.  Menses are light.  Exam is unremarkable.  Hematocrit and MCV are normal.  Ferritin is 32.  Plan: 1.  Labs today:  CBC with diff, ferritin, iron studies. 2.  Discuss plan for Venofer 100-200 mg if ferritin < 50 secondary to symptoms 3.  Nurse to call patient with ferritin results. 4.  RTC in 3 months for MD assess, labs (CBC with  diff, ferritin- day before), and +/- Venofer.   Lequita Asal, MD  02/07/2016, 11:52 AM

## 2016-05-03 ENCOUNTER — Inpatient Hospital Stay: Payer: Medicare Other | Attending: Hematology and Oncology

## 2016-05-03 DIAGNOSIS — N809 Endometriosis, unspecified: Secondary | ICD-10-CM | POA: Insufficient documentation

## 2016-05-03 DIAGNOSIS — R5383 Other fatigue: Secondary | ICD-10-CM | POA: Insufficient documentation

## 2016-05-03 DIAGNOSIS — D509 Iron deficiency anemia, unspecified: Secondary | ICD-10-CM | POA: Insufficient documentation

## 2016-05-03 DIAGNOSIS — J45909 Unspecified asthma, uncomplicated: Secondary | ICD-10-CM | POA: Insufficient documentation

## 2016-05-03 DIAGNOSIS — Z79899 Other long term (current) drug therapy: Secondary | ICD-10-CM | POA: Insufficient documentation

## 2016-05-03 DIAGNOSIS — Z809 Family history of malignant neoplasm, unspecified: Secondary | ICD-10-CM | POA: Insufficient documentation

## 2016-05-03 DIAGNOSIS — R55 Syncope and collapse: Secondary | ICD-10-CM | POA: Insufficient documentation

## 2016-05-03 DIAGNOSIS — F5089 Other specified eating disorder: Secondary | ICD-10-CM | POA: Insufficient documentation

## 2016-05-03 DIAGNOSIS — Q431 Hirschsprung's disease: Secondary | ICD-10-CM | POA: Insufficient documentation

## 2016-05-03 DIAGNOSIS — F419 Anxiety disorder, unspecified: Secondary | ICD-10-CM | POA: Insufficient documentation

## 2016-05-03 DIAGNOSIS — N920 Excessive and frequent menstruation with regular cycle: Secondary | ICD-10-CM | POA: Insufficient documentation

## 2016-05-08 ENCOUNTER — Inpatient Hospital Stay (HOSPITAL_BASED_OUTPATIENT_CLINIC_OR_DEPARTMENT_OTHER): Payer: Medicare Other | Admitting: Hematology and Oncology

## 2016-05-08 ENCOUNTER — Inpatient Hospital Stay: Payer: Medicare Other

## 2016-05-08 ENCOUNTER — Encounter: Payer: Self-pay | Admitting: Hematology and Oncology

## 2016-05-08 ENCOUNTER — Other Ambulatory Visit: Payer: Self-pay | Admitting: Hematology and Oncology

## 2016-05-08 VITALS — BP 112/70 | HR 68 | Resp 18

## 2016-05-08 VITALS — BP 114/76 | HR 61 | Temp 97.6°F | Resp 18 | Wt 131.2 lb

## 2016-05-08 DIAGNOSIS — Z809 Family history of malignant neoplasm, unspecified: Secondary | ICD-10-CM

## 2016-05-08 DIAGNOSIS — F419 Anxiety disorder, unspecified: Secondary | ICD-10-CM

## 2016-05-08 DIAGNOSIS — N920 Excessive and frequent menstruation with regular cycle: Secondary | ICD-10-CM | POA: Diagnosis not present

## 2016-05-08 DIAGNOSIS — Q431 Hirschsprung's disease: Secondary | ICD-10-CM

## 2016-05-08 DIAGNOSIS — J45909 Unspecified asthma, uncomplicated: Secondary | ICD-10-CM | POA: Diagnosis not present

## 2016-05-08 DIAGNOSIS — D509 Iron deficiency anemia, unspecified: Secondary | ICD-10-CM

## 2016-05-08 DIAGNOSIS — R5383 Other fatigue: Secondary | ICD-10-CM | POA: Diagnosis not present

## 2016-05-08 DIAGNOSIS — Z23 Encounter for immunization: Secondary | ICD-10-CM

## 2016-05-08 DIAGNOSIS — N809 Endometriosis, unspecified: Secondary | ICD-10-CM | POA: Diagnosis not present

## 2016-05-08 DIAGNOSIS — R55 Syncope and collapse: Secondary | ICD-10-CM | POA: Diagnosis not present

## 2016-05-08 DIAGNOSIS — Z79899 Other long term (current) drug therapy: Secondary | ICD-10-CM

## 2016-05-08 DIAGNOSIS — F5089 Other specified eating disorder: Secondary | ICD-10-CM

## 2016-05-08 LAB — CBC WITH DIFFERENTIAL/PLATELET
Basophils Absolute: 0 10*3/uL (ref 0–0.1)
Basophils Relative: 1 %
Eosinophils Absolute: 0.2 10*3/uL (ref 0–0.7)
Eosinophils Relative: 3 %
HCT: 39.7 % (ref 35.0–47.0)
Hemoglobin: 13.4 g/dL (ref 12.0–16.0)
Lymphocytes Relative: 32 %
Lymphs Abs: 1.9 10*3/uL (ref 1.0–3.6)
MCH: 31.1 pg (ref 26.0–34.0)
MCHC: 33.7 g/dL (ref 32.0–36.0)
MCV: 92.2 fL (ref 80.0–100.0)
Monocytes Absolute: 0.5 10*3/uL (ref 0.2–0.9)
Monocytes Relative: 8 %
Neutro Abs: 3.5 10*3/uL (ref 1.4–6.5)
Neutrophils Relative %: 57 %
Platelets: 202 10*3/uL (ref 150–440)
RBC: 4.3 MIL/uL (ref 3.80–5.20)
RDW: 13 % (ref 11.5–14.5)
WBC: 6.1 10*3/uL (ref 3.6–11.0)

## 2016-05-08 LAB — FERRITIN: Ferritin: 36 ng/mL (ref 11–307)

## 2016-05-08 LAB — URINALYSIS COMPLETE WITH MICROSCOPIC (ARMC ONLY)
Bilirubin Urine: NEGATIVE
Glucose, UA: NEGATIVE mg/dL
Ketones, ur: NEGATIVE mg/dL
Leukocytes, UA: NEGATIVE
Nitrite: NEGATIVE
Protein, ur: NEGATIVE mg/dL
Specific Gravity, Urine: 1.01 (ref 1.005–1.030)
pH: 6.5 (ref 5.0–8.0)

## 2016-05-08 MED ORDER — SODIUM CHLORIDE 0.9 % IV SOLN
Freq: Once | INTRAVENOUS | Status: AC
  Administered 2016-05-08: 12:00:00 via INTRAVENOUS
  Filled 2016-05-08: qty 1000

## 2016-05-08 MED ORDER — SODIUM CHLORIDE 0.9 % IV SOLN
100.0000 mg | Freq: Once | INTRAVENOUS | Status: AC
  Administered 2016-05-08: 100 mg via INTRAVENOUS
  Filled 2016-05-08: qty 5

## 2016-05-08 MED ORDER — INFLUENZA VAC SPLIT QUAD 0.5 ML IM SUSY
0.5000 mL | PREFILLED_SYRINGE | Freq: Once | INTRAMUSCULAR | Status: AC
  Administered 2016-05-08: 0.5 mL via INTRAMUSCULAR
  Filled 2016-05-08: qty 0.5

## 2016-05-08 NOTE — Progress Notes (Signed)
[facility]-  Cancer Center  Clinic day:  05/08/2016  Chief Complaint: Latasha [name] is a 30 y.o. 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  HPI: The patient was last seen in the medical oncology clinic on 02/07/2016.  At that time, she had return of ice pica.  Her hair was thinning.  She denied any melena or hematochezia.  CBC included a hematocrit of 42.9, hemoglobin 14.3, and MCV 92.7.  Ferritin was 32.  Iron studies included a saturation of 30% and a TIBC of 423 (normal).  During the interim, she notes being "really really tired". She has ice pica. Menses are light. She uses 4 capsules of MiraLAX a day.  She is followed by Dr. Dayna Ramus at [facility].  Her last colonoscopy was in 09/2015 was "okay".  She denies any hematuria.   Past Medical History:  Diagnosis Date  . Anemia   . Anxiety   . Asthma   . Hirschsprung disease   . Syncope, cardiogenic     Past Surgical History:  Procedure Laterality Date  . abdomial adhesions removed  2012  . APPENDECTOMY  1997  . bladder repair surgery  1997  . c- sections  [id number]  . enodmetiosis removed  2012  . OVARIAN CYST REMOVAL  2003, 2012  . TONSILLECTOMY  2007    Family History  Problem Relation Age of Onset  . Cancer Mother   . Diabetes Mother   . Hypertension Mother   . Cancer Father   . Cancer Maternal Grandfather   . Factor IX deficiency Maternal Grandfather   . Factor IX deficiency Son     Social History:  reports that she has never smoked. She has never used smokeless tobacco. She reports that she does not drink alcohol or use drugs.  The patient is accompanied by her husband today.  Allergies:  Allergies  Allergen Reactions  . Meperidine Anaphylaxis    Other reaction(s): Other (See Comments) Other Reaction: Not Assessed  . Lactose     Other reaction(s): Other (See Comments) Other Reaction: GI Upset  . Fludrocortisone Hives  .  Levofloxacin Hives    Current Medications: Current Outpatient Prescriptions  Medication Sig Dispense Refill  . albuterol (PROAIR HFA) 108 (90 Base) MCG/ACT inhaler Inhale into the lungs.    Marland Kitchen EPINEPHrine (EPIPEN 2-PAK) 0.3 mg/0.3 mL IJ SOAJ injection Inject into the muscle. Reported on 02/07/2016    . hyoscyamine (LEVSIN/SL) 0.125 MG SL tablet Take 0.125 mg by mouth.    Marland Kitchen Linaclotide (LINZESS) 290 MCG CAPS capsule Take by mouth.    . midodrine (PROAMATINE) 10 MG tablet Take by mouth.    . norethindrone-ethinyl estradiol (JUNEL 1/20) 1-20 MG-MCG tablet Take 1 tablet by mouth daily.    Marland Kitchen omeprazole (PRILOSEC) 40 MG capsule TK ONE C PO  QD  0  . ondansetron (ZOFRAN) 4 MG tablet Take 4 mg by mouth.    . polyethylene glycol (MIRALAX / GLYCOLAX) packet Take by mouth.    . Probiotic Product (CULTURELLE IMMUNE DEFENSE PO) Take by mouth.    . prochlorperazine (COMPAZINE) 10 MG tablet Take 10 mg by mouth.    . promethazine (PHENERGAN) 25 MG tablet Take by mouth.    Marland Kitchen REXULTI 1 MG TABS TK 1 T PO QHS  1  . sertraline (ZOLOFT) 100 MG tablet Take 100 mg by mouth daily.    . Sodium Phosphates (RA ENEMA) 7-19 GM/118ML ENEM Place  rectally.    . SUMAtriptan (IMITREX) 25 MG tablet   3  . sertraline (ZOLOFT) 100 MG tablet Take 100 mg by mouth daily.      No current facility-administered medications for this visit.    Review of Systems:  GENERAL:  Feels "realy, really tired".  Fatigue.  No fevers or sweats.  Weight up 4 pounds. PERFORMANCE STATUS (ECOG):  1 HEENT:  No visual changes, runny nose, sore throat, mouth sores or tenderness. Lungs: No shortness of breath or cough.  No hemoptysis. Cardiac:  No chest pain, palpitations, orthopnea, or PND. GI:  Chronic constipation.  Nausea and vomiting secondary to constipation.  No diarrhea, melena or hematochezia.  Last colonoscopy 09/2015. GU:  Menses light on BCPs.  No urgency, frequency, dysuria, or hematuria. Musculoskeletal:  No back pain.  No joint pain.   No muscle tenderness. Extremities:  No pain or swelling. Skin:  Easy bruising.  Hair thinning/loss.  No rashes or skin changes. Neuro:  No headache, numbness or weakness, balance or coordination issues. Endocrine:  No diabetes, thyroid issues, hot flashes or night sweats. Psych:  No mood changes, depression or anxiety. Pain:  No focal pain. Review of systems:  All other systems reviewed and found to be negative.   Physical Exam: Blood pressure 114/76, pulse 61, temperature 97.6 F (36.4 C), temperature source Tympanic, resp. rate 18, weight 131 lb 2.8 oz (59.5 kg). GENERAL:  Thin tall young woman sitting comfortably in the exam room in no acute distress. MENTAL STATUS:  Alert and oriented to person, place and time. HEAD:  Long brown hair.  Normocephalic, atraumatic, face symmetric, no Cushingoid features. EYES:  Blue eyes.  Pupils equal round and reactive to light and accomodation.  No conjunctivitis or scleral icterus. ENT:  Oropharynx clear without lesion.  Tongue normal. Mucous membranes moist.  RESPIRATORY:  Clear to auscultation without rales, wheezes or rhonchi. CARDIOVASCULAR:  Regular rate and rhythm without murmur, rub or gallop. ABDOMEN:  Soft, non-tender, with active bowel sounds, and no hepatosplenomegaly.  No masses. SKIN:  No rashes, ulcers or lesions. EXTREMITIES: No edema, no skin discoloration or tenderness.  No palpable cords. LYMPH NODES: No palpable cervical, supraclavicular, axillary or inguinal adenopathy  NEUROLOGICAL: Unremarkable. PSYCH:  Appropriate.    Appointment on 05/08/2016  Component Date Value Ref Range Status  . WBC 05/08/2016 6.1  3.6 - 11.0 K/uL Final  . RBC 05/08/2016 4.30  3.80 - 5.20 MIL/uL Final  . Hemoglobin 05/08/2016 13.4  12.0 - 16.0 g/dL Final  . HCT 16/10/960409/20/2017 39.7  35.0 - 47.0 % Final  . MCV 05/08/2016 92.2  80.0 - 100.0 fL Final  . MCH 05/08/2016 31.1  26.0 - 34.0 pg Final  . MCHC 05/08/2016 33.7  32.0 - 36.0 g/dL Final  . RDW  54/09/811909/20/2017 13.0  11.5 - 14.5 % Final  . Platelets 05/08/2016 202  150 - 440 K/uL Final  . Neutrophils Relative % 05/08/2016 57  % Final  . Neutro Abs 05/08/2016 3.5  1.4 - 6.5 K/uL Final  . Lymphocytes Relative 05/08/2016 32  % Final  . Lymphs Abs 05/08/2016 1.9  1.0 - 3.6 K/uL Final  . Monocytes Relative 05/08/2016 8  % Final  . Monocytes Absolute 05/08/2016 0.5  0.2 - 0.9 K/uL Final  . Eosinophils Relative 05/08/2016 3  % Final  . Eosinophils Absolute 05/08/2016 0.2  0 - 0.7 K/uL Final  . Basophils Relative 05/08/2016 1  % Final  . Basophils Absolute 05/08/2016 0.0  0 -  0.1 K/uL Final  . Ferritin 05/08/2016 36  11 - 307 ng/mL Final  . Color, Urine 05/08/2016 YELLOW  YELLOW Final  . APPearance 05/08/2016 CLEAR  CLEAR Final  . Glucose, UA 05/08/2016 NEGATIVE  NEGATIVE mg/dL Final  . Bilirubin Urine 05/08/2016 NEGATIVE  NEGATIVE Final  . Ketones, ur 05/08/2016 NEGATIVE  NEGATIVE mg/dL Final  . Specific Gravity, Urine 05/08/2016 1.010  1.005 - 1.030 Final  . Hgb urine dipstick 05/08/2016 TRACE* NEGATIVE Final  . pH 05/08/2016 6.5  5.0 - 8.0 Final  . Protein, ur 05/08/2016 NEGATIVE  NEGATIVE mg/dL Final  . Nitrite 16/05/9603 NEGATIVE  NEGATIVE Final  . Leukocytes, UA 05/08/2016 NEGATIVE  NEGATIVE Final  . RBC / HPF 05/08/2016 0-5  0 - 5 RBC/hpf Final  . WBC, UA 05/08/2016 0-5  0 - 5 WBC/hpf Final  . Bacteria, UA 05/08/2016 RARE* NONE SEEN Final  . Squamous Epithelial / LPF 05/08/2016 6-30* NONE SEEN Final    Assessment:  Latasha [name] is a 30 y.o. 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  Ferritin was 3 on 10/04/2014 consistent with iron deficiency.  She denies any melena or hematochezia.  She has heavy menses felt secondary to endometriosis.    Work-up on 03/08/2015 confirmed iron deficiency (ferritin 5). B12 and folate were normal.   She has received 600 mg of Venofer (03/15/2015 - 04/19/2015) with resolution of  symptoms.  Ferritin was 64 on 04/26/2015.  She received Venofer x 2 (08/09/2015 - 08/16/2015) and x 1 (10/31/2015) secondary to return of symptoms.  Hematocrit was 39.4, MCV 92.1, ferritin 9 (low), and TIBC 468 (high) on 07/26/2015.  She receives Venofer if ferritin is < 50 with symptoms.  She has a son with factor IX deficiency.  Another son is being evaluated for von Willebrand's disease.  Work-up on 03/08/2015 revealed a normal platelet count, PT, PTT, von Willebrand panel, and multimers.  Symptomatically, she is extremely fatigued.  She has ice pica.  She has been on birth control pills since 06/2015.  Menses are light.  Exam is unremarkable.  Hematocrit and MCV are normal.  Ferritin is 36.  Plan: 1.  Labs today:  CBC with diff, ferritin. 2.  Etiology of ongoing iron needs unclear.  Discuss phone follow-up with her gastroenterologist at [facility]. 3.  Urinalysis today to r/o hematuria. 4.  Venofer 100 mg IV today. 5.  Discuss plan for additional Venofer if ferritin < 50 secondary to symptoms 6.  Nurse to call patient with ferritin results. 7.  RTC in 3 months for MD assess, labs (CBC with diff, ferritin- day before), and +/- Venofer.  Addendum:  I spoke with Dr. Shirlee Limerick today.  Diagnosis of Hirshsprung's unclear.  Discuss ongoing need for Venofer without etiology as menses are now light.  He will contact her regarding a GI evaluation.   Rosey Bath, MD  05/08/2016, 10:57 AM

## 2016-05-08 NOTE — Progress Notes (Signed)
Patient would like to get flu vaccine today.

## 2016-05-09 ENCOUNTER — Telehealth: Payer: Self-pay | Admitting: Hematology and Oncology

## 2016-05-09 NOTE — Telephone Encounter (Signed)
Called pt to let her know that her ferritin came up some to 36.  Dr. Merlene Pullingorcoran feels like she needs one more dose of venofer and she [facility] and pt feels that tues would work better for her and she prefers am and I sent a message to Vernona R[place]gerLaura [place] to call pt with that appt. Also pt wanted to know results of ua and I did tell her that it did not show any blood and that is why corcoran wanted it to be done.  It did show trace of bacteria but with every female if they do not clean with wipes first then it usually can show trace and we did not ask her to do the wipes because it was not a urine culture and dr Merlene Pullingcorcoran feels it is ok.  Pt agreeable to the plan.  I also asked her if she has got [facility] and she said no.   I asked her to call us back next Monday if she had not heard from the office with appt and I will call back and check on it.  The conversation that corcoran had with Dr. Shirlee LimerickMarion he said that he would get office to call with appt. For pt.

## 2016-05-09 NOTE — Telephone Encounter (Signed)
I had already spoke to pt after I spoke to [place] but Merlene PullingCorcoran did not tell me to let pt know that in 1-2 weeks after next week infusion that if she is still feeling tired and having pica to call us and we can set up another infusion. Pt. Verbalizes understanding and will call if needed.

## 2016-05-09 NOTE — Telephone Encounter (Signed)
She would like nurse to please call and tell her about lab and urine results. Thanks!

## 2016-05-09 NOTE — Telephone Encounter (Signed)
-----   Message from Rosey BathMelissa C Corcoran, MD sent at 05/08/2016  4:44 PM EDT ----- Regarding: Please call patient  Ferritin 36.  Should be ok with 1 dose of Venofer.   If still notes fatigue and pica in next 1-2 weeks, can have second dose.  She only receives 100 mg at a time.  M ----- Message ----- From: Interface, [place] Sent: 05/08/2016  11:19 AM To: Rosey BathMelissa C Corcoran, MD

## 2016-05-14 ENCOUNTER — Ambulatory Visit: Payer: Medicare Other

## 2016-05-15 ENCOUNTER — Inpatient Hospital Stay: Payer: Medicare Other

## 2016-05-15 VITALS — BP 106/70 | HR 62 | Temp 96.5°F | Resp 17

## 2016-05-15 DIAGNOSIS — D509 Iron deficiency anemia, unspecified: Secondary | ICD-10-CM

## 2016-05-15 MED ORDER — SODIUM CHLORIDE 0.9 % IV SOLN
100.0000 mg | Freq: Once | INTRAVENOUS | Status: AC
  Administered 2016-05-15: 100 mg via INTRAVENOUS
  Filled 2016-05-15: qty 5

## 2016-05-15 MED ORDER — SODIUM CHLORIDE 0.9 % IV SOLN
Freq: Once | INTRAVENOUS | Status: AC
  Administered 2016-05-15: 10:00:00 via INTRAVENOUS
  Filled 2016-05-15: qty 1000

## 2016-07-29 DIAGNOSIS — K5902 Outlet dysfunction constipation: Secondary | ICD-10-CM | POA: Insufficient documentation

## 2017-02-12 ENCOUNTER — Other Ambulatory Visit: Payer: Self-pay | Admitting: *Deleted

## 2017-02-12 DIAGNOSIS — D649 Anemia, unspecified: Secondary | ICD-10-CM

## 2017-02-17 ENCOUNTER — Inpatient Hospital Stay: Payer: Medicare Other | Attending: Hematology and Oncology

## 2017-02-17 ENCOUNTER — Other Ambulatory Visit: Payer: Medicare Other

## 2017-02-17 DIAGNOSIS — D509 Iron deficiency anemia, unspecified: Secondary | ICD-10-CM | POA: Diagnosis not present

## 2017-02-17 DIAGNOSIS — J45909 Unspecified asthma, uncomplicated: Secondary | ICD-10-CM | POA: Diagnosis not present

## 2017-02-17 DIAGNOSIS — Q431 Hirschsprung's disease: Secondary | ICD-10-CM | POA: Insufficient documentation

## 2017-02-17 DIAGNOSIS — R5383 Other fatigue: Secondary | ICD-10-CM | POA: Insufficient documentation

## 2017-02-17 DIAGNOSIS — R3129 Other microscopic hematuria: Secondary | ICD-10-CM | POA: Insufficient documentation

## 2017-02-17 DIAGNOSIS — R55 Syncope and collapse: Secondary | ICD-10-CM | POA: Insufficient documentation

## 2017-02-17 DIAGNOSIS — Z808 Family history of malignant neoplasm of other organs or systems: Secondary | ICD-10-CM | POA: Diagnosis not present

## 2017-02-17 DIAGNOSIS — D649 Anemia, unspecified: Secondary | ICD-10-CM

## 2017-02-17 DIAGNOSIS — K5900 Constipation, unspecified: Secondary | ICD-10-CM | POA: Insufficient documentation

## 2017-02-17 DIAGNOSIS — F419 Anxiety disorder, unspecified: Secondary | ICD-10-CM | POA: Insufficient documentation

## 2017-02-17 DIAGNOSIS — N809 Endometriosis, unspecified: Secondary | ICD-10-CM | POA: Diagnosis not present

## 2017-02-17 DIAGNOSIS — F5089 Other specified eating disorder: Secondary | ICD-10-CM | POA: Insufficient documentation

## 2017-02-17 LAB — CBC WITH DIFFERENTIAL/PLATELET
Basophils Absolute: 0 10*3/uL (ref 0–0.1)
Basophils Relative: 1 %
Eosinophils Absolute: 0.1 10*3/uL (ref 0–0.7)
Eosinophils Relative: 2 %
HCT: 40 % (ref 35.0–47.0)
Hemoglobin: 13.1 g/dL (ref 12.0–16.0)
Lymphocytes Relative: 33 %
Lymphs Abs: 1.8 10*3/uL (ref 1.0–3.6)
MCH: 30.1 pg (ref 26.0–34.0)
MCHC: 32.7 g/dL (ref 32.0–36.0)
MCV: 92.1 fL (ref 80.0–100.0)
Monocytes Absolute: 0.3 10*3/uL (ref 0.2–0.9)
Monocytes Relative: 6 %
Neutro Abs: 3.3 10*3/uL (ref 1.4–6.5)
Neutrophils Relative %: 58 %
Platelets: 196 10*3/uL (ref 150–440)
RBC: 4.34 MIL/uL (ref 3.80–5.20)
RDW: 13.3 % (ref 11.5–14.5)
WBC: 5.7 10*3/uL (ref 3.6–11.0)

## 2017-02-17 LAB — FERRITIN: Ferritin: 39 ng/mL (ref 11–307)

## 2017-02-18 ENCOUNTER — Telehealth: Payer: Self-pay | Admitting: *Deleted

## 2017-02-18 NOTE — Telephone Encounter (Signed)
Called patient and LVM that Ferritin is 39.

## 2017-02-26 ENCOUNTER — Ambulatory Visit: Payer: Medicare Other | Admitting: Hematology and Oncology

## 2017-03-05 ENCOUNTER — Other Ambulatory Visit: Payer: Self-pay | Admitting: *Deleted

## 2017-03-05 ENCOUNTER — Telehealth: Payer: Self-pay | Admitting: *Deleted

## 2017-03-05 ENCOUNTER — Inpatient Hospital Stay: Payer: Medicare Other

## 2017-03-05 ENCOUNTER — Inpatient Hospital Stay (HOSPITAL_BASED_OUTPATIENT_CLINIC_OR_DEPARTMENT_OTHER): Payer: Medicare Other | Admitting: Hematology and Oncology

## 2017-03-05 ENCOUNTER — Encounter: Payer: Self-pay | Admitting: Hematology and Oncology

## 2017-03-05 VITALS — BP 103/66 | HR 65

## 2017-03-05 VITALS — BP 104/72 | HR 65 | Temp 96.0°F | Resp 16 | Ht 65.0 in | Wt 128.3 lb

## 2017-03-05 DIAGNOSIS — F5089 Other specified eating disorder: Secondary | ICD-10-CM | POA: Diagnosis not present

## 2017-03-05 DIAGNOSIS — N809 Endometriosis, unspecified: Secondary | ICD-10-CM | POA: Diagnosis not present

## 2017-03-05 DIAGNOSIS — Q431 Hirschsprung's disease: Secondary | ICD-10-CM

## 2017-03-05 DIAGNOSIS — D508 Other iron deficiency anemias: Secondary | ICD-10-CM

## 2017-03-05 DIAGNOSIS — R55 Syncope and collapse: Secondary | ICD-10-CM

## 2017-03-05 DIAGNOSIS — R5383 Other fatigue: Secondary | ICD-10-CM

## 2017-03-05 DIAGNOSIS — J45909 Unspecified asthma, uncomplicated: Secondary | ICD-10-CM

## 2017-03-05 DIAGNOSIS — D509 Iron deficiency anemia, unspecified: Secondary | ICD-10-CM

## 2017-03-05 DIAGNOSIS — K5900 Constipation, unspecified: Secondary | ICD-10-CM

## 2017-03-05 DIAGNOSIS — Z808 Family history of malignant neoplasm of other organs or systems: Secondary | ICD-10-CM | POA: Diagnosis not present

## 2017-03-05 DIAGNOSIS — F419 Anxiety disorder, unspecified: Secondary | ICD-10-CM | POA: Diagnosis not present

## 2017-03-05 DIAGNOSIS — R3129 Other microscopic hematuria: Secondary | ICD-10-CM | POA: Diagnosis not present

## 2017-03-05 LAB — URINALYSIS, COMPLETE (UACMP) WITH MICROSCOPIC
Bacteria, UA: NONE SEEN
Bilirubin Urine: NEGATIVE
Glucose, UA: NEGATIVE mg/dL
Hgb urine dipstick: NEGATIVE
Ketones, ur: NEGATIVE mg/dL
Leukocytes, UA: NEGATIVE
Nitrite: NEGATIVE
Protein, ur: NEGATIVE mg/dL
RBC / HPF: NONE SEEN RBC/hpf (ref 0–5)
Specific Gravity, Urine: 1.01 (ref 1.005–1.030)
pH: 6 (ref 5.0–8.0)

## 2017-03-05 MED ORDER — SODIUM CHLORIDE 0.9 % IV SOLN
Freq: Once | INTRAVENOUS | Status: AC
  Administered 2017-03-05: 10:00:00 via INTRAVENOUS
  Filled 2017-03-05: qty 1000

## 2017-03-05 MED ORDER — IRON SUCROSE 20 MG/ML IV SOLN
100.0000 mg | Freq: Once | INTRAVENOUS | Status: AC
  Administered 2017-03-05: 100 mg via INTRAVENOUS

## 2017-03-05 MED ORDER — SODIUM CHLORIDE 0.9 % IV SOLN: 100.0000 mg | Freq: Once | INTRAVENOUS | Status: DC

## 2017-03-05 MED ORDER — IRON SUCROSE 20 MG/ML IV SOLN: 200.0000 mg | Freq: Once | INTRAVENOUS | Status: DC

## 2017-03-05 NOTE — Progress Notes (Signed)
[facility]-  Cancer Center  Clinic day:  03/05/2017   Chief Complaint: Latasha [name] is a 31 y.o. 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  HPI: The patient was last seen in the medical oncology clinic on 05/08/2016.  At that time, she was extremely fatigued.  She had ice pica. Menses was light.  Exam was unremarkable.  Hematocrit and MCV were normal.  Ferritin was 36.  She received Venofer 100 mg.  I spoke with Dr. Erie Noe, gastroenterologist fellow at [facility], after her appointment.  A GI evaluation was to be scheduled.  Urinalysis on 05/08/2016 revealed trace hemoglobin and 0-5 RBCs.  She was seen by Dr. Shirlee Limerick and Dr. Lorenda Peck at [facility] on 11/11/2016.  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 Constipation has been long standing after ovarian cyst removal leading to bladder injury and peritonitis in her childhoold. Only Zelnorm has helped with her constipation. Hirschsprung's disease has been ruled out by biopsy. Biofeedback was unsuccessful in the past. She has been on several laxatives, bulking agents, Amitiza, Linzess without improvement in constipation.  A neuromuscular specialist was to be consulted for a second opinion.  A capsule study was going to be considered if heme studies continued to show iron deficiency.  CBC on 02/17/2017 revealed a hematocrit of 40, hemoglobin 13.1, MCV 92.1, platelets 196,000, white count 5700 with an ANC of 3300. Ferritin was 39.  During the interim, she notes return of fatigue.  She has had ice pica for 2 months.  Menses remains light.  She was recently seen in acute care clinic.  Urinalysis revealed trace blood.  Urine culture was negative.  Urinalysis was collected 1 1/2 weeks from last menses.   Past Medical History:  Diagnosis Date  . Anemia   . Anxiety   .  Asthma   . Hirschsprung disease   . Syncope, cardiogenic     Past Surgical History:  Procedure Laterality Date  . abdomial adhesions removed  2012  . APPENDECTOMY  1997  . bladder repair surgery  1997  . c- sections  [id number]  . enodmetiosis removed  2012  . OVARIAN CYST REMOVAL  2003, 2012  . TONSILLECTOMY  2007    Family History  Problem Relation Age of Onset  . Cancer Mother   . Diabetes Mother   . Hypertension Mother   . Cancer Father   . Cancer Maternal Grandfather   . Factor IX deficiency Maternal Grandfather   . Factor IX deficiency Son     Social History:  reports that she has never smoked. She has never used smokeless tobacco. She reports that she does not drink alcohol or use drugs.  The patient is accompanied by her husband today.  Allergies:  Allergies  Allergen Reactions  . Meperidine Anaphylaxis    Other reaction(s): Other (See Comments) Other Reaction: Not Assessed  . Lactose     Other reaction(s): Other (See Comments) Other Reaction: GI Upset  . Fludrocortisone Hives  . Levofloxacin Hives    Current Medications: Current Outpatient Prescriptions  Medication Sig Dispense Refill  . albuterol (PROAIR HFA) 108 (90 Base) MCG/ACT inhaler Inhale into the lungs.    Marland Kitchen EPINEPHrine (EPIPEN 2-PAK) 0.3 mg/0.3 mL IJ SOAJ injection Inject into the muscle. Reported on 02/07/2016    . hyoscyamine (LEVSIN/SL) 0.125 MG SL tablet Take 0.125 mg by  mouth.    . midodrine (PROAMATINE) 10 MG tablet Take by mouth.    . norethindrone-ethinyl estradiol (JUNEL 1/20) 1-20 MG-MCG tablet Take 1 tablet by mouth daily.    Marland Kitchen. omeprazole (PRILOSEC) 40 MG capsule TK ONE C PO  QD  0  . ondansetron (ZOFRAN) 4 MG tablet Take 4 mg by mouth.    . Plecanatide (TRULANCE) 3 MG TABS TAKE 1 TABLET(3 MG) BY MOUTH DAILY    . polyethylene glycol (MIRALAX / GLYCOLAX) packet Take by mouth.    . Probiotic Product (CULTURELLE IMMUNE DEFENSE PO) Take by mouth.    . prochlorperazine (COMPAZINE) 10 MG  tablet Take 10 mg by mouth.    . promethazine (PHENERGAN) 25 MG tablet Take by mouth.    . sertraline (ZOLOFT) 100 MG tablet Take 100 mg by mouth daily.    . Sodium Phosphates (RA ENEMA) 7-19 GM/118ML ENEM Place rectally.    . SUMAtriptan (IMITREX) 25 MG tablet   3  . sertraline (ZOLOFT) 100 MG tablet Take 100 mg by mouth daily.      No current facility-administered medications for this visit.    Review of Systems:  GENERAL:  Fatigue.  No fevers or sweats.  Weight down 3 pounds. PERFORMANCE STATUS (ECOG):  1 HEENT:  No visual changes, runny nose, sore throat, mouth sores or tenderness. Lungs: No shortness of breath or cough.  No hemoptysis. Cardiac:  No chest pain, palpitations, orthopnea, or PND. GI:  Chronic constipation.  Nausea and vomiting secondary to constipation.  No diarrhea, melena or hematochezia.  Last colonoscopy 09/2015. GU:  Menses light on BCPs.  Microscopic hematuria.  No urgency, frequency, or dysuria. Musculoskeletal:  No back pain.  No joint pain.  No muscle tenderness. Extremities:  No pain or swelling. Skin:  Easy bruising.  Hair thinning/loss.  No rashes or skin changes. Neuro:  No headache, numbness or weakness, balance or coordination issues. Endocrine:  No diabetes, thyroid issues, hot flashes or night sweats. Psych:  No mood changes, depression or anxiety. Pain:  No focal pain. Review of systems:  All other systems reviewed and found to be negative.   Physical Exam: Blood pressure 104/72, pulse 65, temperature (!) 96 F (35.6 C), temperature source Oral, resp. rate 16, height 5\' 5"  (1.651 m), weight 128 lb 4.9 oz (58.2 kg), SpO2 99 %. GENERAL:  Thin tall young woman sitting comfortably in the exam room in no acute distress. MENTAL STATUS:  Alert and oriented to person, place and time. HEAD:  Long brown hair.  Normocephalic, atraumatic, face symmetric, no Cushingoid features. EYES:  Blue eyes.  Pupils equal round and reactive to light and accomodation.  No  conjunctivitis or scleral icterus. ENT:  Oropharynx clear without lesion.  Tongue normal. Mucous membranes moist.  RESPIRATORY:  Clear to auscultation without rales, wheezes or rhonchi. CARDIOVASCULAR:  Regular rate and rhythm without murmur, rub or gallop. ABDOMEN:  Soft, non-tender, with active bowel sounds, and no hepatosplenomegaly.  No masses. SKIN:  Tan.  No rashes, ulcers or lesions. EXTREMITIES: No edema, no skin discoloration or tenderness.  No palpable cords. LYMPH NODES: No palpable cervical, supraclavicular, axillary or inguinal adenopathy  NEUROLOGICAL: Unremarkable. PSYCH:  Appropriate.    No visits with results within 3 Day(s) from this visit.  Latest known visit with results is:  Appointment on 02/17/2017  Component Date Value Ref Range Status  . WBC 02/17/2017 5.7  3.6 - 11.0 K/uL Final  . RBC 02/17/2017 4.34  3.80 - 5.20 MIL/uL  Final  . Hemoglobin 02/17/2017 13.1  12.0 - 16.0 g/dL Final  . HCT 16/05/9603 40.0  35.0 - 47.0 % Final  . MCV 02/17/2017 92.1  80.0 - 100.0 fL Final  . MCH 02/17/2017 30.1  26.0 - 34.0 pg Final  . MCHC 02/17/2017 32.7  32.0 - 36.0 g/dL Final  . RDW 54/04/8118 13.3  11.5 - 14.5 % Final  . Platelets 02/17/2017 196  150 - 440 K/uL Final  . Neutrophils Relative % 02/17/2017 58  % Final  . Neutro Abs 02/17/2017 3.3  1.4 - 6.5 K/uL Final  . Lymphocytes Relative 02/17/2017 33  % Final  . Lymphs Abs 02/17/2017 1.8  1.0 - 3.6 K/uL Final  . Monocytes Relative 02/17/2017 6  % Final  . Monocytes Absolute 02/17/2017 0.3  0.2 - 0.9 K/uL Final  . Eosinophils Relative 02/17/2017 2  % Final  . Eosinophils Absolute 02/17/2017 0.1  0 - 0.7 K/uL Final  . Basophils Relative 02/17/2017 1  % Final  . Basophils Absolute 02/17/2017 0.0  0 - 0.1 K/uL Final  . Ferritin 02/17/2017 39  11 - 307 ng/mL Final    Assessment:  Latasha [name] is a 31 y.o. female with chronic constipation and iron deficiency anemia.  Ferritin was 3 on 10/04/2014 consistent with iron  deficiency.  She denies any melena or hematochezia.  She has a history of heavy menses felt secondary to endometriosis.  Menses is now light on BCP.  EGD was normal in 08/2016.  Colonoscopy was normal in 09/2015.  She has microscopic hematuria.  Work-up on 03/08/2015 confirmed iron deficiency (ferritin 5). B12 and folate were normal.   She has received 600 mg of Venofer (03/15/2015 - 04/19/2015) with resolution of symptoms.  Ferritin was 64 on 04/26/2015.  She received Venofer x 2 (08/09/2015 - 08/16/2015) and x 1 (10/31/2015) secondary to return of symptoms.  Hematocrit was 39.4, MCV 92.1, ferritin 9 (low), and TIBC 468 (high) on 07/26/2015.  She receives Venofer if ferritin is < 50 with symptoms.  She has a son with factor IX deficiency.  Another son is being evaluated for von Willebrand's disease.  Work-up on 03/08/2015 revealed a normal platelet count, PT, PTT, von Willebrand panel, and multimers.  Symptomatically, she is fatigued.  She has microscopic hematuria.  She has ice pica x 2 months.  Exam is unremarkable.  Hematocrit and MCV are normal.  Ferritin is 39.  Plan: 1.  Review labs from 02/17/2017.  Discuss normal hematocrit and MCV.  As ferritin < 50 and patient symptomatic, reinitiate IV iron. 2.  Discuss microscopic hematuria and recent urinalysis from acute care clinic.  No UTI and 1 1/2 weeks post menses.  Check UA and referral to urology. 3.  Review evaluation by by Dr. Shirlee Limerick.  Consideration of capsule study if abnormal hematology studies persist. 4.  Urology consult 5.  Confirm active authorization for venofer. 6.  Venofer 100 mg IV today 7.  RTC in 1 week for Venofer 8.  RTC in 3 months for MD assessment and labs (CBC with diff, ferritin-day before) and +/- Venofer.   Rosey Bath, MD  03/05/2017, 8:58 AM

## 2017-03-05 NOTE — Telephone Encounter (Signed)
Luann Chrismon authorized administration of Venifer today.

## 2017-03-05 NOTE — Progress Notes (Signed)
Patient here for follow She states she has been experiencing Pica (chewing ice and has been very fatigued.

## 2017-03-12 ENCOUNTER — Inpatient Hospital Stay: Payer: Medicare Other

## 2017-03-12 VITALS — BP 115/71 | HR 59 | Temp 95.6°F | Resp 18

## 2017-03-12 DIAGNOSIS — D508 Other iron deficiency anemias: Secondary | ICD-10-CM

## 2017-03-12 DIAGNOSIS — D509 Iron deficiency anemia, unspecified: Secondary | ICD-10-CM | POA: Diagnosis not present

## 2017-03-12 MED ORDER — IRON SUCROSE 20 MG/ML IV SOLN
100.0000 mg | Freq: Once | INTRAVENOUS | Status: DC
Start: 2017-03-12 — End: 2017-03-12

## 2017-03-12 MED ORDER — SODIUM CHLORIDE 0.9 % IV SOLN
Freq: Once | INTRAVENOUS | Status: AC
  Administered 2017-03-12: 09:00:00 via INTRAVENOUS
  Filled 2017-03-12: qty 1000

## 2017-03-12 MED ORDER — IRON SUCROSE 20 MG/ML IV SOLN
100.0000 mg | Freq: Once | INTRAVENOUS | Status: AC
  Administered 2017-03-12: 100 mg via INTRAVENOUS

## 2017-03-12 NOTE — Patient Instructions (Signed)

## 2017-03-30 NOTE — Progress Notes (Signed)
03/31/2017 4:16 PM   Latasha [name] [birth year] [id number]  Referring provider: Rayetta Humphrey, MD [address] [place], [place] [address]  Chief Complaint  Patient presents with  . Hematuria    Microscopic referred by Dr. Merlene Pulling at the cancer center  . Recurrent UTI    HPI: Patient is a 31 -year-old Caucasian female who presents today as a referral from their hematologist, Dr. Merlene Pulling, for microscopic hematuria.     Patient was found to have microscopic hematuria 10 months ago with 0-5 RBC's/hpf.  Patient does have a prior history of microscopic hematuria that was thought to be the results of UTI's.  More recently, she has had negative cultures.      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  Her bladder was punctured after undergoing an appendectomy at age 50.  Mesh was used for the repair.  She has not had had nephrolithiasis.   She does not have a family medical history of nephrolithiasis, malignancies of the genitourinary tract or hematuria.   Today, she is having symptoms of frequent urination and nocturia x 1.  Her UA today demonstrates no AMH.    She is not experiencing any suprapubic pain, abdominal pain or flank pain. She denies any recent fevers, chills, nausea or vomiting.   She has not had any recent imaging studies.   She is not a smoker.   She is not exposed to secondhand smoke.  She has not worked with Personnel officer, trichloroethylene, etc.      PMH: Past Medical History:  Diagnosis Date  . Acid reflux   . Anemia   . Anxiety   . Asthma   . Chronic constipation   . Hirschsprung disease   . Pelvic floor dysfunction   . Syncope, cardiogenic     Surgical History: Past Surgical History:  Procedure Laterality Date  . abdomial adhesions removed  2012  . APPENDECTOMY  1997  . bladder repair surgery  1997  . c- sections  [id number]  . enodmetiosis removed  2012  . OVARIAN  CYST REMOVAL  2003, 2012  . TONSILLECTOMY  2007    Home Medications:  Allergies as of 03/31/2017      Reactions   Meperidine Anaphylaxis   Other reaction(s): Other (See Comments) Other Reaction: Not Assessed   Lactose    Other reaction(s): Other (See Comments) Other Reaction: GI Upset   Fludrocortisone Hives   Levofloxacin Hives   Sulfamethoxazole-trimethoprim Other (See Comments)      Medication List       Accurate as of 03/31/17  4:16 PM. Always use your most recent med list.          CULTURELLE IMMUNE DEFENSE PO Take by mouth.   EPIPEN 2-PAK 0.3 mg/0.3 mL Soaj injection Generic drug:  EPINEPHrine Inject into the muscle. Reported on 02/07/2016   JUNEL 1/20 1-20 MG-MCG tablet Generic drug:  norethindrone-ethinyl estradiol Take 1 tablet by mouth daily.   LEVSIN/SL 0.125 MG SL tablet Generic drug:  hyoscyamine Take 0.125 mg by mouth.   midodrine 10 MG tablet Commonly known as:  PROAMATINE Take by mouth.   omeprazole 40 MG capsule Commonly known as:  PRILOSEC TK ONE C PO  QD   ondansetron 4 MG disintegrating tablet Commonly known as:  ZOFRAN-ODT Take by mouth.   ondansetron 4 MG tablet Commonly known as:  ZOFRAN Take 4 mg by mouth.   polyethylene glycol packet  Commonly known as:  MIRALAX / GLYCOLAX Take by mouth.   PROAIR HFA 108 (90 Base) MCG/ACT inhaler Generic drug:  albuterol Inhale into the lungs.   prochlorperazine 10 MG tablet Commonly known as:  COMPAZINE Take 10 mg by mouth.   promethazine 25 MG tablet Commonly known as:  PHENERGAN Take by mouth.   RA ENEMA 7-19 GM/118ML Enem Place rectally.   SUMAtriptan 25 MG tablet Commonly known as:  IMITREX   TRULANCE 3 MG Tabs Generic drug:  Plecanatide TAKE 1 TABLET(3 MG) BY MOUTH DAILY   sertraline 100 MG tablet Commonly known as:  ZOLOFT Take 100 mg by mouth daily.   ZOLOFT 100 MG tablet Generic drug:  sertraline Take 100 mg by mouth daily.       Allergies:  Allergies    Allergen Reactions  . Meperidine Anaphylaxis    Other reaction(s): Other (See Comments) Other Reaction: Not Assessed  . Lactose     Other reaction(s): Other (See Comments) Other Reaction: GI Upset  . Fludrocortisone Hives  . Levofloxacin Hives  . Sulfamethoxazole-Trimethoprim Other (See Comments)    Family History: Family History  Problem Relation Age of Onset  . Cancer Mother   . Diabetes Mother   . Hypertension Mother   . Cancer Father   . Cancer Maternal Grandfather   . Factor IX deficiency Maternal Grandfather   . Factor IX deficiency Son   . Kidney cancer Neg Hx   . Bladder Cancer Neg Hx     Social History:  reports that she has never smoked. She has never used smokeless tobacco. She reports that she does not drink alcohol or use drugs.  ROS: UROLOGY Frequent Urination?: Yes Hard to postpone urination?: No Burning/pain with urination?: No Get up at night to urinate?: Yes Leakage of urine?: No Urine stream starts and stops?: No Trouble starting stream?: No Do you have to strain to urinate?: No Blood in urine?: Yes Urinary tract infection?: Yes Sexually transmitted disease?: No Injury to kidneys or bladder?: Yes Painful intercourse?: No Weak stream?: No Currently pregnant?: No Vaginal bleeding?: No Last menstrual period?: 03/12/2017  Gastrointestinal Nausea?: Yes Vomiting?: No Indigestion/heartburn?: No Diarrhea?: No Constipation?: Yes  Constitutional Fever: No Night sweats?: No Weight loss?: No Fatigue?: No  Skin Skin rash/lesions?: No Itching?: No  Eyes Blurred vision?: No Double vision?: No  Ears/Nose/Throat Sore throat?: No Sinus problems?: No  Hematologic/Lymphatic Swollen glands?: No Easy bruising?: No  Cardiovascular Leg swelling?: No Chest pain?: No  Respiratory Cough?: No Shortness of breath?: No  Endocrine Excessive thirst?: No  Musculoskeletal Back pain?: No Joint pain?: No  Neurological Headaches?:  No Dizziness?: No  Psychologic Depression?: No Anxiety?: Yes  Physical Exam: BP 110/72   Pulse 61   Ht 5\' 5"  (1.651 m)   Wt 130 lb 8 oz (59.2 kg)   LMP 03/12/2017   BMI 21.72 kg/m   Constitutional: Well nourished. Alert and oriented, No acute distress. HEENT: [place] AT, moist mucus membranes. Trachea midline, no masses. Cardiovascular: No clubbing, cyanosis, or edema. Respiratory: Normal respiratory effort, no increased work of breathing. GI: Abdomen is soft, non tender, non distended, no abdominal masses. Liver and spleen not palpable.  No hernias appreciated.  Stool sample for occult testing is not indicated.   GU: No CVA tenderness.  No bladder fullness or masses.   Skin: No rashes, bruises or suspicious lesions. Lymph: No cervical or inguinal adenopathy. Neurologic: Grossly intact, no focal deficits, moving all 4 extremities. Psychiatric: Normal mood and affect.  Laboratory Data: Lab Results  Component Value Date   WBC 5.7 02/17/2017   HGB 13.1 02/17/2017   HCT 40.0 02/17/2017   MCV 92.1 02/17/2017   PLT 196 02/17/2017     Urinalysis Unremarkable.  See EPIC.  Assessment & Plan:    1. Microscopic hematuria  - I explained to the patient that there are a number of causes that can be associated with blood in the urine, such as stones, UTI's, damage to the urinary tract and/or cancer.  - At this time, I felt that the patient warranted further urologic evaluation.   The AUA guidelines state that a CT urogram is the preferred imaging study to evaluate hematuria.  - I explained to the patient that a contrast material will be injected into a vein and that in rare instances, an allergic reaction can result and may even life threatening   The patient denies any allergies to contrast, iodine and/or seafood and is not taking metformin.  - Her reproductive status is unknown at this time.  We will obtain a serum pregnancy test today.   - The patient will return following all of the  above for discussion of the results.   - UA  - Urine culture  - BUN + creatinine      Return for CT report.  These notes generated with voice recognition software. I apologize for typographical errors.  Michiel Cowboy, PA-C  [facility] [address], [address] [place], [place] [address] [phone]

## 2017-03-31 ENCOUNTER — Encounter: Payer: Self-pay | Admitting: Urology

## 2017-03-31 ENCOUNTER — Ambulatory Visit (INDEPENDENT_AMBULATORY_CARE_PROVIDER_SITE_OTHER): Payer: Medicare Other | Admitting: Urology

## 2017-03-31 VITALS — BP 110/72 | HR 61 | Ht 65.0 in | Wt 130.5 lb

## 2017-03-31 DIAGNOSIS — R3129 Other microscopic hematuria: Secondary | ICD-10-CM

## 2017-03-31 NOTE — Addendum Note (Signed)
Addended by: Skeet LatchWATKINS, Jakyria C on: 03/31/2017 04:34 PM   Modules accepted: Orders

## 2017-04-01 LAB — URINALYSIS, COMPLETE
Bilirubin, UA: NEGATIVE
Glucose, UA: NEGATIVE
KETONES UA: NEGATIVE
Leukocytes, UA: NEGATIVE
NITRITE UA: NEGATIVE
Protein, UA: NEGATIVE
SPEC GRAV UA: 1.02 (ref 1.005–1.030)
Urobilinogen, Ur: 0.2 mg/dL (ref 0.2–1.0)
pH, UA: 6 (ref 5.0–7.5)

## 2017-04-01 LAB — HCG, SERUM, QUALITATIVE: hCG,Beta Subunit,Qual,Serum: NEGATIVE m[IU]/mL (ref <6)

## 2017-04-01 LAB — BUN+CREAT
BUN/Creatinine Ratio: 20 (ref 9–23)
BUN: 18 mg/dL (ref 6–20)
CREATININE: 0.91 mg/dL (ref 0.57–1.00)
GFR calc Af Amer: 98 mL/min/{1.73_m2} (ref >59)
GFR, EST NON AFRICAN AMERICAN: 85 mL/min/{1.73_m2} (ref >59)

## 2017-04-01 LAB — MICROSCOPIC EXAMINATION
Bacteria, UA: NONE SEEN
RBC MICROSCOPIC, UA: NONE SEEN /HPF (ref 0–?)
WBC, UA: NONE SEEN /hpf (ref 0–?)

## 2017-04-03 LAB — CULTURE, URINE COMPREHENSIVE

## 2017-04-13 DIAGNOSIS — I9589 Other hypotension: Secondary | ICD-10-CM | POA: Insufficient documentation

## 2017-04-17 ENCOUNTER — Ambulatory Visit: Payer: Medicare Other

## 2017-04-24 ENCOUNTER — Ambulatory Visit: Payer: Medicare Other | Admitting: Urology

## 2017-04-29 ENCOUNTER — Ambulatory Visit
Admission: RE | Admit: 2017-04-29 | Discharge: 2017-04-29 | Disposition: A | Discharge status: Home / Self Care | Payer: Medicare Other | Source: Ambulatory Visit | Attending: Urology | Admitting: Urology

## 2017-04-29 DIAGNOSIS — R3129 Other microscopic hematuria: Secondary | ICD-10-CM | POA: Diagnosis present

## 2017-04-29 MED ORDER — IOPAMIDOL (ISOVUE-300) INJECTION 61%
100.0000 mL | Freq: Once | INTRAVENOUS | Status: AC | PRN
  Administered 2017-04-29: 100 mL via INTRAVENOUS

## 2017-05-01 ENCOUNTER — Ambulatory Visit: Payer: Medicare Other | Admitting: Urology

## 2017-05-06 NOTE — Progress Notes (Signed)
05/08/2017 10:14 AM   Latasha [name] 1985/09/29 [id number]  Referring provider: Rayetta Humphrey, MD [address] [place], [place] [address]  Chief Complaint  Patient presents with  . Results    CT    HPI: 31 yo WF with AMH who presents today to discuss CTU results.  Background history Patient is a 33 -year-old Caucasian female who presents today as a referral from their hematologist, Dr. Merlene Pulling, for microscopic hematuria.   Patient was found to have microscopic hematuria 10 months ago with 0-5 RBC's/hpf.  Patient does have a prior history of microscopic hematuria that was thought to be the results of UTI's.  More recently, she has had negative cultures.   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  Her bladder was punctured after undergoing an appendectomy at age 38.  Mesh was used for the repair.  She has not had nephrolithiasis.   She does not have a family medical history of nephrolithiasis, malignancies of the genitourinary tract or hematuria.   She is not a smoker.   She is not exposed to secondhand smoke.  She has not worked with Personnel officer, trichloroethylene, etc.     CTU completed on 04/29/2017 was negative.    Today, She is not experiencing gross hematuria, suprapubic pain or dysuria. She's not had any fevers, chills, nausea or vomiting..   Her UA was negative.       PMH: Past Medical History:  Diagnosis Date  . Acid reflux   . Anemia   . Anxiety   . Asthma   . Chronic constipation   . Hirschsprung disease   . Pelvic floor dysfunction   . Syncope, cardiogenic     Surgical History: Past Surgical History:  Procedure Laterality Date  . abdomial adhesions removed  2012  . APPENDECTOMY  1997  . bladder repair surgery  1997  . c- sections  [id number]  . enodmetiosis removed  2012  . OVARIAN CYST REMOVAL  2003, 2012  . TONSILLECTOMY  2007    Home Medications:  Allergies as of  05/08/2017      Reactions   Meperidine Anaphylaxis   Other reaction(s): Other (See Comments) Other Reaction: Not Assessed   Lactose    Other reaction(s): Other (See Comments) Other Reaction: GI Upset   Fludrocortisone Hives   Levofloxacin Hives   Sulfamethoxazole-trimethoprim Other (See Comments)      Medication List       Accurate as of 05/08/17 10:14 AM. Always use your most recent med list.          CULTURELLE IMMUNE DEFENSE PO Take by mouth.   EPIPEN 2-PAK 0.3 mg/0.3 mL Soaj injection Generic drug:  EPINEPHrine Inject into the muscle. Reported on 02/07/2016   JUNEL 1/20 1-20 MG-MCG tablet Generic drug:  norethindrone-ethinyl estradiol Take 1 tablet by mouth daily.   LEVSIN/SL 0.125 MG SL tablet Generic drug:  hyoscyamine Take 0.125 mg by mouth.   midodrine 10 MG tablet Commonly known as:  PROAMATINE Take by mouth.   omeprazole 40 MG capsule Commonly known as:  PRILOSEC TK ONE C PO  QD   ondansetron 4 MG disintegrating tablet Commonly known as:  ZOFRAN-ODT Take by mouth.   ondansetron 4 MG tablet Commonly known as:  ZOFRAN Take 4 mg by mouth.   Plecanatide 3 MG Tabs Take 3 mg by mouth.   polyethylene glycol packet Commonly known as:  MIRALAX / GLYCOLAX Take  by mouth.   PROAIR HFA 108 (90 Base) MCG/ACT inhaler Generic drug:  albuterol Inhale into the lungs.   prochlorperazine 10 MG tablet Commonly known as:  COMPAZINE Take 10 mg by mouth.   promethazine 25 MG tablet Commonly known as:  PHENERGAN Take by mouth.   RA ENEMA 7-19 GM/118ML Enem Place rectally.   SUMAtriptan 25 MG tablet Commonly known as:  IMITREX   sertraline 100 MG tablet Commonly known as:  ZOLOFT Take 100 mg by mouth daily.   ZOLOFT 100 MG tablet Generic drug:  sertraline Take 100 mg by mouth daily.            Discharge Care Instructions        Start     Ordered   05/08/17 0000  Urinalysis, Complete     05/08/17 0951      Allergies:  Allergies    Allergen Reactions  . Meperidine Anaphylaxis    Other reaction(s): Other (See Comments) Other Reaction: Not Assessed  . Lactose     Other reaction(s): Other (See Comments) Other Reaction: GI Upset  . Fludrocortisone Hives  . Levofloxacin Hives  . Sulfamethoxazole-Trimethoprim Other (See Comments)    Family History: Family History  Problem Relation Age of Onset  . Cancer Mother   . Diabetes Mother   . Hypertension Mother   . Cancer Father   . Cancer Maternal Grandfather   . Factor IX deficiency Maternal Grandfather   . Factor IX deficiency Son   . Kidney cancer Neg Hx   . Bladder Cancer Neg Hx     Social History:  reports that she has never smoked. She has never used smokeless tobacco. She reports that she does not drink alcohol or use drugs.  ROS: UROLOGY Frequent Urination?: No Hard to postpone urination?: No Burning/pain with urination?: No Get up at night to urinate?: No Leakage of urine?: No Urine stream starts and stops?: No Trouble starting stream?: No Do you have to strain to urinate?: No Blood in urine?: No Urinary tract infection?: No Sexually transmitted disease?: No Injury to kidneys or bladder?: No Painful intercourse?: No Weak stream?: No Currently pregnant?: No Vaginal bleeding?: No Last menstrual period?: n  Gastrointestinal Nausea?: No Vomiting?: No Indigestion/heartburn?: No Diarrhea?: No Constipation?: No  Constitutional Fever: No Night sweats?: No Weight loss?: No Fatigue?: No  Skin Skin rash/lesions?: No Itching?: No  Eyes Blurred vision?: No Double vision?: No  Ears/Nose/Throat Sore throat?: No Sinus problems?: No  Hematologic/Lymphatic Swollen glands?: No Easy bruising?: No  Cardiovascular Leg swelling?: No Chest pain?: No  Respiratory Cough?: No Shortness of breath?: No  Endocrine Excessive thirst?: No  Musculoskeletal Back pain?: No Joint pain?: No  Neurological Headaches?: No Dizziness?:  No  Psychologic Depression?: No Anxiety?: No  Physical Exam: BP 107/68   Pulse 64   Ht  (1.651 m)   Wt 134 lb 3.2 oz (60.9 kg)   LMP 05/06/2017   BMI 22.33 kg/m   Constitutional: Well nourished. Alert and oriented, No acute distress. HEENT: [place] AT, moist mucus membranes. Trachea midline, no masses. Cardiovascular: No clubbing, cyanosis, or edema. Respiratory: Normal respiratory effort, no increased work of breathing. Skin: No rashes, bruises or suspicious lesions. Lymph: No cervical or inguinal adenopathy. Neurologic: Grossly intact, no focal deficits, moving all 4 extremities. Psychiatric: Normal mood and affect.  Laboratory Data: Lab Results  Component Value Date   WBC 5.7 02/17/2017   HGB 13.1 02/17/2017   HCT 40.0 02/17/2017   MCV 92.1 02/17/2017  PLT 196 02/17/2017    Urinalysis Unremarkable.  See EPIC.  I have reviewed the labs  Pertinent imaging CLINICAL DATA:  Microscopic hematuria  EXAM: CT ABDOMEN AND PELVIS WITHOUT AND WITH CONTRAST  TECHNIQUE: Multidetector CT imaging of the abdomen and pelvis was performed following the standard protocol before and following the bolus administration of intravenous contrast.  CONTRAST:  ISOVUE-300 IOPAMIDOL (ISOVUE-300) INJECTION 61%  COMPARISON:  None.  FINDINGS: Lower chest: Lung bases are clear.  Hepatobiliary: Liver is within normal limits.  Gallbladder is unremarkable. No intrahepatic or extrahepatic ductal dilatation.  Pancreas: Within normal limits.  Spleen: Within normal limits.  Adrenals/Urinary Tract: Adrenal glands are within normal limits.  Kidneys are within normal limits.  No enhancing renal lesions.  No renal, ureteral, or bladder calculi.  No hydronephrosis.  Bladder is within normal limits.  Stomach/Bowel: Stomach is within normal limits.  No evidence of bowel obstruction.  Appendix is not discretely visualized.  Vascular/Lymphatic: No evidence of  abdominal aortic aneurysm.  No suspicious abdominopelvic lymphadenopathy.  Reproductive: Uterus is within normal limits.  Bilateral ovaries are within normal limits.  Other: Trace pelvic ascites.  Musculoskeletal: Visualized osseous structures are within normal limits.  IMPRESSION: No CT findings to account for the patient's hematuria.  8 minutes.   Electronically Signed   By: Charline Bills M.D.   On: 04/29/2017 13:54  I have independently reviewed the films  Assessment & Plan:    1. Microscopic hematuria  - CTU completed - no malignancies seen  - UA - no hematuria  - RTC to 6 months for recheck and UA  - Patient to return for any gross hematuria      Return in about 6 months (around 11/05/2017) for UA and recheck.  These notes generated with voice recognition software. I apologize for typographical errors.  Michiel Cowboy, PA-C  [facility] [address] [place], [place] [address] [phone]

## 2017-05-08 ENCOUNTER — Encounter: Payer: Self-pay | Admitting: Urology

## 2017-05-08 ENCOUNTER — Ambulatory Visit (INDEPENDENT_AMBULATORY_CARE_PROVIDER_SITE_OTHER): Payer: Medicare Other | Admitting: Urology

## 2017-05-08 VITALS — BP 107/68 | HR 64 | Ht 65.0 in | Wt 134.2 lb

## 2017-05-08 DIAGNOSIS — R3129 Other microscopic hematuria: Secondary | ICD-10-CM

## 2017-05-08 LAB — MICROSCOPIC EXAMINATION
RBC, UA: NONE SEEN /hpf (ref 0–?)
WBC UA: NONE SEEN /HPF (ref 0–?)

## 2017-05-08 LAB — URINALYSIS, COMPLETE
Bilirubin, UA: NEGATIVE
GLUCOSE, UA: NEGATIVE
Ketones, UA: NEGATIVE
Leukocytes, UA: NEGATIVE
Nitrite, UA: NEGATIVE
PH UA: 5.5 (ref 5.0–7.5)
Protein, UA: NEGATIVE
Specific Gravity, UA: 1.02 (ref 1.005–1.030)
UUROB: 0.2 mg/dL (ref 0.2–1.0)

## 2017-05-27 ENCOUNTER — Inpatient Hospital Stay: Payer: Medicare Other | Attending: Hematology and Oncology

## 2017-05-27 ENCOUNTER — Other Ambulatory Visit: Payer: Self-pay

## 2017-05-27 DIAGNOSIS — Q431 Hirschsprung's disease: Secondary | ICD-10-CM | POA: Diagnosis not present

## 2017-05-27 DIAGNOSIS — R3129 Other microscopic hematuria: Secondary | ICD-10-CM | POA: Insufficient documentation

## 2017-05-27 DIAGNOSIS — K219 Gastro-esophageal reflux disease without esophagitis: Secondary | ICD-10-CM | POA: Diagnosis not present

## 2017-05-27 DIAGNOSIS — K5900 Constipation, unspecified: Secondary | ICD-10-CM | POA: Insufficient documentation

## 2017-05-27 DIAGNOSIS — R5383 Other fatigue: Secondary | ICD-10-CM | POA: Diagnosis not present

## 2017-05-27 DIAGNOSIS — F5089 Other specified eating disorder: Secondary | ICD-10-CM | POA: Insufficient documentation

## 2017-05-27 DIAGNOSIS — Z79899 Other long term (current) drug therapy: Secondary | ICD-10-CM | POA: Diagnosis not present

## 2017-05-27 DIAGNOSIS — D509 Iron deficiency anemia, unspecified: Secondary | ICD-10-CM | POA: Diagnosis not present

## 2017-05-27 DIAGNOSIS — J45909 Unspecified asthma, uncomplicated: Secondary | ICD-10-CM | POA: Insufficient documentation

## 2017-05-27 DIAGNOSIS — F419 Anxiety disorder, unspecified: Secondary | ICD-10-CM | POA: Insufficient documentation

## 2017-05-27 DIAGNOSIS — M2550 Pain in unspecified joint: Secondary | ICD-10-CM | POA: Insufficient documentation

## 2017-05-27 DIAGNOSIS — D508 Other iron deficiency anemias: Secondary | ICD-10-CM

## 2017-05-27 LAB — CBC WITH DIFFERENTIAL/PLATELET
Basophils Absolute: 0 10*3/uL (ref 0–0.1)
Basophils Relative: 0 %
Eosinophils Absolute: 0.1 10*3/uL (ref 0–0.7)
Eosinophils Relative: 2 %
HCT: 39.6 % (ref 35.0–47.0)
Hemoglobin: 13.3 g/dL (ref 12.0–16.0)
Lymphocytes Relative: 27 %
Lymphs Abs: 1.7 10*3/uL (ref 1.0–3.6)
MCH: 31.1 pg (ref 26.0–34.0)
MCHC: 33.5 g/dL (ref 32.0–36.0)
MCV: 92.8 fL (ref 80.0–100.0)
Monocytes Absolute: 0.4 10*3/uL (ref 0.2–0.9)
Monocytes Relative: 7 %
Neutro Abs: 3.9 10*3/uL (ref 1.4–6.5)
Neutrophils Relative %: 64 %
Platelets: 172 10*3/uL (ref 150–440)
RBC: 4.26 MIL/uL (ref 3.80–5.20)
RDW: 13.1 % (ref 11.5–14.5)
WBC: 6.2 10*3/uL (ref 3.6–11.0)

## 2017-05-27 LAB — FERRITIN: Ferritin: 39 ng/mL (ref 11–307)

## 2017-05-27 NOTE — Progress Notes (Deleted)
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  HPI: The patient was last seen in the medical oncology clinic on 03/05/2017.  At that time, she was extremely fatigued.  She had recurrent ice pica for 2 months. She denied heavy menses.  Exam was unremarkable. She had recently been seen at the [facility] and advised that she had trace blood in her urine. UA repeated; negative for blood in the clinic that day. She was referred to urology.   CBC on 02/17/2017 was normal. Ferritin was 39. She received Venofer 100 mg IV. We discussed consideration of a capsule study if iron deficiency persisted. She received a second 100 mg dose of Venofer on 03/12/2017.  Patient was seen in consult on 03/31/2017 by Michiel Cowboy, PA-C (urology). CT hematuria workup was done on 04/29/2017 revealing no findings to account for the patient's microscopic hematuria. Patient followed up with urology on 05/08/2017. Her urinalysis was repeated revealing no microscopic hematuria. Patient is scheduled for follow-up with urology in March 2019.  CBC on 05/27/2017 revealed a hemoglobin 13.3, hematocrit 39.6, MCV 92.8.  Ferritin was 39.  During the interim,    Past Medical History:  Diagnosis Date  . Acid reflux   . Anemia   . Anxiety   . Asthma   . Chronic constipation   . Hirschsprung disease   . Pelvic floor dysfunction   . Syncope, cardiogenic     Past Surgical History:  Procedure Laterality Date  . abdomial adhesions removed  2012  . APPENDECTOMY  1997  . bladder repair surgery  1997  . c- sections  [id number]  . enodmetiosis removed  2012  . OVARIAN CYST REMOVAL  2003, 2012  . TONSILLECTOMY  2007    Family History  Problem Relation Age of Onset  . Cancer Mother   . Diabetes Mother   . Hypertension Mother   . Cancer Father   . Cancer  Maternal Grandfather   . Factor IX deficiency Maternal Grandfather   . Factor IX deficiency Son   . Kidney cancer Neg Hx   . Bladder Cancer Neg Hx     Social History:  reports that she has never smoked. She has never used smokeless tobacco. She reports that she does not drink alcohol or use drugs.  The patient is accompanied by her husband today.  Allergies:  Allergies  Allergen Reactions  . Meperidine Anaphylaxis    Other reaction(s): Other (See Comments) Other Reaction: Not Assessed  . Lactose     Other reaction(s): Other (See Comments) Other Reaction: GI Upset  . Fludrocortisone Hives  . Levofloxacin Hives  . Sulfamethoxazole-Trimethoprim Other (See Comments)    Current Medications: Current Outpatient Prescriptions  Medication Sig Dispense Refill  . albuterol (PROAIR HFA) 108 (90 Base) MCG/ACT inhaler Inhale into the lungs.    Marland Kitchen EPINEPHrine (EPIPEN 2-PAK) 0.3 mg/0.3 mL IJ SOAJ injection Inject into the muscle. Reported on 02/07/2016    . hyoscyamine (LEVSIN/SL) 0.125 MG SL tablet Take 0.125 mg by mouth.    . midodrine (PROAMATINE) 10 MG tablet Take by mouth.    . norethindrone-ethinyl estradiol (JUNEL 1/20) 1-20 MG-MCG tablet Take 1 tablet by mouth daily.    Marland Kitchen omeprazole (PRILOSEC) 40 MG capsule TK ONE C PO  QD  0  . ondansetron (ZOFRAN) 4 MG tablet Take 4 mg  by mouth.    . ondansetron (ZOFRAN-ODT) 4 MG disintegrating tablet Take by mouth.    . Plecanatide 3 MG TABS Take 3 mg by mouth.    . polyethylene glycol (MIRALAX / GLYCOLAX) packet Take by mouth.    . Probiotic Product (CULTURELLE IMMUNE DEFENSE PO) Take by mouth.    . prochlorperazine (COMPAZINE) 10 MG tablet Take 10 mg by mouth.    . promethazine (PHENERGAN) 25 MG tablet Take by mouth.    . sertraline (ZOLOFT) 100 MG tablet Take 100 mg by mouth daily.     . sertraline (ZOLOFT) 100 MG tablet Take 100 mg by mouth daily.    . Sodium Phosphates (RA ENEMA) 7-19 GM/118ML ENEM Place rectally.    . SUMAtriptan (IMITREX) 25  MG tablet   3   No current facility-administered medications for this visit.    Review of Systems:  GENERAL:  Fatigue.  No fevers or sweats.  Weight  PERFORMANCE STATUS (ECOG):  1 HEENT:  No visual changes, runny nose, sore throat, mouth sores or tenderness. Lungs: No shortness of breath or cough.  No hemoptysis. Cardiac:  No chest pain, palpitations, orthopnea, or PND. GI:  Chronic constipation.  Nausea and vomiting secondary to constipation.  No diarrhea, melena or hematochezia.  Last colonoscopy 09/2015. GU:  Menses light on BCPs.  Microscopic hematuria.  No urgency, frequency, or dysuria. Musculoskeletal:  No back pain.  No joint pain.  No muscle tenderness. Extremities:  No pain or swelling. Skin:  Easy bruising.  Hair thinning/loss.  No rashes or skin changes. Neuro:  No headache, numbness or weakness, balance or coordination issues. Endocrine:  No diabetes, thyroid issues, hot flashes or night sweats. Psych:  No mood changes, depression or anxiety. Pain:  No focal pain. Review of systems:  All other systems reviewed and found to be negative.   Physical Exam: Last menstrual period 05/06/2017. GENERAL:  Thin tall young woman sitting comfortably in the exam room in no acute distress. MENTAL STATUS:  Alert and oriented to person, place and time. HEAD:  Long brown hair.  Normocephalic, atraumatic, face symmetric, no Cushingoid features. EYES:  Blue eyes.  Pupils equal round and reactive to light and accomodation.  No conjunctivitis or scleral icterus. ENT:  Oropharynx clear without lesion.  Tongue normal. Mucous membranes moist.  RESPIRATORY:  Clear to auscultation without rales, wheezes or rhonchi. CARDIOVASCULAR:  Regular rate and rhythm without murmur, rub or gallop. ABDOMEN:  Soft, non-tender, with active bowel sounds, and no hepatosplenomegaly.  No masses. SKIN:  Tan.  No rashes, ulcers or lesions. EXTREMITIES: No edema, no skin discoloration or tenderness.  No palpable  cords. LYMPH NODES: No palpable cervical, supraclavicular, axillary or inguinal adenopathy  NEUROLOGICAL: Unremarkable. PSYCH:  Appropriate.    Orders Only on 05/27/2017  Component Date Value Ref Range Status  . WBC 05/27/2017 6.2  3.6 - 11.0 K/uL Final  . RBC 05/27/2017 4.26  3.80 - 5.20 MIL/uL Final  . Hemoglobin 05/27/2017 13.3  12.0 - 16.0 g/dL Final  . HCT 16/05/9603 39.6  35.0 - 47.0 % Final  . MCV 05/27/2017 92.8  80.0 - 100.0 fL Final  . MCH 05/27/2017 31.1  26.0 - 34.0 pg Final  . MCHC 05/27/2017 33.5  32.0 - 36.0 g/dL Final  . RDW 54/04/8118 13.1  11.5 - 14.5 % Final  . Platelets 05/27/2017 172  150 - 440 K/uL Final  . Neutrophils Relative % 05/27/2017 64  % Final  . Neutro Abs 05/27/2017 3.9  1.4 - 6.5 K/uL Final  . Lymphocytes Relative 05/27/2017 27  % Final  . Lymphs Abs 05/27/2017 1.7  1.0 - 3.6 K/uL Final  . Monocytes Relative 05/27/2017 7  % Final  . Monocytes Absolute 05/27/2017 0.4  0.2 - 0.9 K/uL Final  . Eosinophils Relative 05/27/2017 2  % Final  . Eosinophils Absolute 05/27/2017 0.1  0 - 0.7 K/uL Final  . Basophils Relative 05/27/2017 0  % Final  . Basophils Absolute 05/27/2017 0.0  0 - 0.1 K/uL Final    Assessment:  KORBYN VANES is a 31 y.o. female with chronic constipation and iron deficiency anemia.  Ferritin was 3 on 10/04/2014 consistent with iron deficiency.  She denies any melena or hematochezia.  She has a history of heavy menses felt secondary to endometriosis.  Menses is now light on BCP.  EGD was normal in 08/2016.  Colonoscopy was normal in 09/2015.  She has a history of microscopic hematuria.  CT hematuria work-up on 04/29/2017 was negative.  She is followed by urology.  Work-up on 03/08/2015 confirmed iron deficiency (ferritin 5). B12 and folate were normal.   She has received Venofer 600 mg (03/15/2015 - 04/19/2015), x 2 (08/09/2015 - 08/16/2015), x 1 (10/31/2015), x 2 (05/08/2016 - 05/15/2016), and x 2 (03/05/2017 - 03/12/2017). She  receives Venofer if ferritin is < 50 with symptoms.  Ferritin has been followed: 5 on 03/08/2015, 28 on 03/29/2015, 64 on 04/26/2015, 9 on 07/26/2015, 40 on 08/23/2015, 35 on 09/20/2015 29 on 10/25/2015, 32 on 02/07/2016, 36 on 05/08/2016, 39 on 02/17/2017 and 39 on 05/27/2017  She has a son with factor IX deficiency.  Another son is being evaluated for von Willebrand's disease.  Work-up on 03/08/2015 revealed a normal platelet count, PT, PTT, von Willebrand panel, and multimers.  Symptomatically,  she is fatigued.  She has microscopic hematuria.  She has ice pica x 2 months.  Exam is unremarkable.  Hematocrit 39.6 and MCV 92.8 (normal).  Ferritin is 39.  Plan: 1.  Review labs from 05/27/2017. 2.  Review labs from 02/17/2017.  Discuss normal hematocrit and MCV.  As ferritin < 50 and patient symptomatic, reinitiate IV iron. 2.  Discuss microscopic hematuria and recent urinalysis from acute care clinic.  No UTI and 1 1/2 weeks post menses.  Check UA and referral to urology. 3.  Review evaluation by by Dr. Shirlee Limerick.  Consideration of capsule study if abnormal hematology studies persist. 4.  Urology consult 5.  Confirm active authorization for venofer. 6.  Venofer 100 mg IV today 7.  RTC in 1 week for Venofer 8.  RTC in 3 months for MD assessment and labs (CBC with diff, ferritin-day before) and +/- Venofer.   Latasha Mulling, NP  05/27/2017, 2:12 PM   I saw and evaluated the patient, participating in the key portions of the service and reviewing pertinent diagnostic studies and records.  I reviewed the nurse practitioner's note and agree with the findings and the plan.  The assessment and plan were discussed with the patient.  A few ***multiple questions were asked by the patient and answered.   Nelva Nay, MD 05/28/2017,4:05 AM

## 2017-05-28 ENCOUNTER — Inpatient Hospital Stay: Payer: Medicare Other

## 2017-05-28 ENCOUNTER — Inpatient Hospital Stay: Payer: Medicare Other | Admitting: Hematology and Oncology

## 2017-05-28 ENCOUNTER — Other Ambulatory Visit: Payer: Self-pay | Admitting: Hematology and Oncology

## 2017-05-29 ENCOUNTER — Telehealth: Payer: Self-pay | Admitting: *Deleted

## 2017-05-29 ENCOUNTER — Other Ambulatory Visit: Payer: Self-pay | Admitting: *Deleted

## 2017-05-29 DIAGNOSIS — D508 Other iron deficiency anemias: Secondary | ICD-10-CM

## 2017-05-29 NOTE — Telephone Encounter (Signed)
Erroneous entry

## 2017-06-03 NOTE — Progress Notes (Signed)
[facility]-  Cancer Center  Clinic day:  06/04/2017   Chief Complaint: Latasha [name] is a 31 y.o. 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  HPI: The patient was last seen in the medical oncology clinic on 03/05/2017.  At that time, she was extremely fatigued.  She had recurrent ice pica for 2 months. She denied heavy menses.  Exam was unremarkable. She had recently been seen at the [facility] and advised that she had trace blood in her urine. UA repeated; negative for blood in the clinic that day. She was referred to urology.   CBC on 02/17/2017 was normal.  Ferritin was 39. She received Venofer 100 mg IV. We discussed consideration of a capsule study if iron deficiency persisted. She received a second 100 mg dose of Venofer on 03/12/2017.  Patient was seen in consult on 03/31/2017 by Michiel Cowboy, PA-C (urology). CT hematuria workup on 04/29/2017 revealed no findings to account for the patient's microscopic hematuria. Patient followed up with urology on 05/08/2017. Repeat urinalysis revealed no microscopic hematuria. Patient is scheduled for follow-up with urology in March 2019.  Patient admitted to [facility] for fever or unknown origin in August. She was worked up for sepsis, RMSF, and SLE. She has knee and hip pain.  The only thing that was found was that her ANA was positive. Patient was to be referred to rheumatology, however this has not happened yet.   CBC on 05/27/2017 revealed a hemoglobin 13.3, hematocrit 39.6, MCV 92.8.  Ferritin was 39.  During the interim, patient notes fatigue. She is anergic. Patient has polyarthalgias for the last few months. Patient denies bleeding; no hematochezia, melena, or vaginal bleeding. She has continued constipation. Patient notes that she felt "so much better" following intravenous iron treatments. Patient eating well, with no major weight loss demonstrated. Ice pica is intermittent.     Past Medical History:  Diagnosis Date  . Acid reflux   . Anemia   . Anxiety   . Asthma   . Chronic constipation   . Hirschsprung disease   . Pelvic floor dysfunction   . Syncope, cardiogenic     Past Surgical History:  Procedure Laterality Date  . abdomial adhesions removed  2012  . APPENDECTOMY  1997  . bladder repair surgery  1997  . c- sections  [id number]  . enodmetiosis removed  2012  . OVARIAN CYST REMOVAL  2003, 2012  . TONSILLECTOMY  2007    Family History  Problem Relation Age of Onset  . Cancer Mother   . Diabetes Mother   . Hypertension Mother   . Cancer Father   . Cancer Maternal Grandfather   . Factor IX deficiency Maternal Grandfather   . Factor IX deficiency Son   . Kidney cancer Neg Hx   . Bladder Cancer Neg Hx     Social History:  reports that she has never smoked. She has never used smokeless tobacco. She reports that she does not drink alcohol or use drugs.  The patient is accompanied by her husband today.  Allergies:  Allergies  Allergen Reactions  . Meperidine Anaphylaxis    Other reaction(s): Other (See Comments) Other Reaction: Not Assessed  . Lactose     Other reaction(s): Other (See Comments) Other Reaction: GI Upset  . Fludrocortisone Hives  . Levofloxacin Hives  . Sulfamethoxazole-Trimethoprim Other (See Comments)    Other reaction(s): Dizziness    Current Medications: Current Outpatient Prescriptions  Medication Sig Dispense Refill  . albuterol (PROAIR HFA) 108 (90 Base) MCG/ACT inhaler Inhale 1 puff into the lungs every 6 (six) hours as needed for wheezing or shortness of breath.     . midodrine (PROAMATINE) 10 MG tablet Take 10 mg by mouth daily.     . norethindrone-ethinyl estradiol (JUNEL 1/20) 1-20 MG-MCG tablet Take 1 tablet by mouth daily.    Marland Kitchen omeprazole (PRILOSEC) 40 MG capsule 1 capsule by mouth daily  0  . ondansetron (ZOFRAN) 4 MG tablet Take 4 mg by mouth every 8 (eight) hours as needed for nausea.     .  Plecanatide 3 MG TABS Take 3 mg by mouth daily.     . Probiotic Product (CULTURELLE IMMUNE DEFENSE PO) Take by mouth.    . sertraline (ZOLOFT) 100 MG tablet Take 100 mg by mouth daily.    . Sodium Phosphates (RA ENEMA) 7-19 GM/118ML ENEM Place rectally as needed (constipation).     Marland Kitchen EPINEPHrine (EPIPEN 2-PAK) 0.3 mg/0.3 mL IJ SOAJ injection Inject 0.3 mg into the muscle as needed. Reported on 02/07/2016    . hyoscyamine (LEVSIN/SL) 0.125 MG SL tablet Take 0.125 mg by mouth every 6 (six) hours as needed for cramping.     . prochlorperazine (COMPAZINE) 10 MG tablet Take 10 mg by mouth every 8 (eight) hours as needed for nausea or vomiting.     . promethazine (PHENERGAN) 25 MG tablet Take 25 mg by mouth every 8 (eight) hours as needed for vomiting.     . sertraline (ZOLOFT) 100 MG tablet Take 100 mg by mouth daily.      No current facility-administered medications for this visit.    Review of Systems:  GENERAL:  Fatigue.  No fevers or sweats.  Weight down 2 pounds.  PERFORMANCE STATUS (ECOG):  1 HEENT:  No visual changes, runny nose, sore throat, mouth sores or tenderness. Lungs: No shortness of breath or cough.  No hemoptysis. Cardiac:  No chest pain, palpitations, orthopnea, or PND. GI:  Chronic constipation.  Nausea and vomiting secondary to constipation.  No diarrhea, melena or hematochezia.  Last colonoscopy 09/2015. Intermittent ice pica. GU:  Menses light on BCPs.  Microscopic hematuria.  No urgency, frequency, or dysuria. Musculoskeletal:  No back pain.  Large joint pain.  No muscle tenderness. Extremities:  No pain or swelling. Skin:  Easy bruising.  Hair thinning/loss.  No rashes or skin changes. Neuro:  No headache, numbness or weakness, balance or coordination issues. Endocrine:  No diabetes, thyroid issues, hot flashes or night sweats. Psych:  No mood changes, depression or anxiety. Pain:  No focal pain. Review of systems:  All other systems reviewed and found to be negative.    Physical Exam: Blood pressure 115/77, pulse (!) 55, temperature (!) 97.5 F (36.4 C), temperature source Tympanic, resp. rate 18, height  (1.651 m), weight 132 lb 11.5 oz (60.2 kg), last menstrual period 05/06/2017. GENERAL:  Thin tall young woman sitting comfortably in the exam room in no acute distress. MENTAL STATUS:  Alert and oriented to person, place and time. HEAD:  Long brown hair.  Normocephalic, atraumatic, face symmetric, no Cushingoid features. EYES:  Blue eyes.  Pupils equal round and reactive to light and accomodation.  No conjunctivitis or scleral icterus. ENT:  Oropharynx clear without lesion.  Tongue normal. Mucous membranes moist.  RESPIRATORY:  Clear to auscultation without rales, wheezes or rhonchi. CARDIOVASCULAR:  Regular rate and rhythm without murmur, rub or gallop. ABDOMEN:  Soft,  non-tender, with active bowel sounds, and no hepatosplenomegaly.  No masses. SKIN:  Tan.  No rashes, ulcers or lesions. EXTREMITIES: No edema, no skin discoloration or tenderness.  No palpable cords. LYMPH NODES: No palpable cervical, supraclavicular, axillary or inguinal adenopathy  NEUROLOGICAL: Unremarkable. PSYCH:  Appropriate.    No visits with results within 3 Day(s) from this visit.  Latest known visit with results is:  Orders Only on 05/27/2017  Component Date Value Ref Range Status  . WBC 05/27/2017 6.2  3.6 - 11.0 K/uL Final  . RBC 05/27/2017 4.26  3.80 - 5.20 MIL/uL Final  . Hemoglobin 05/27/2017 13.3  12.0 - 16.0 g/dL Final  . HCT 81/19/1478 39.6  35.0 - 47.0 % Final  . MCV 05/27/2017 92.8  80.0 - 100.0 fL Final  . MCH 05/27/2017 31.1  26.0 - 34.0 pg Final  . MCHC 05/27/2017 33.5  32.0 - 36.0 g/dL Final  . RDW 29/56/2130 13.1  11.5 - 14.5 % Final  . Platelets 05/27/2017 172  150 - 440 K/uL Final  . Neutrophils Relative % 05/27/2017 64  % Final  . Neutro Abs 05/27/2017 3.9  1.4 - 6.5 K/uL Final  . Lymphocytes Relative 05/27/2017 27  % Final  . Lymphs Abs  05/27/2017 1.7  1.0 - 3.6 K/uL Final  . Monocytes Relative 05/27/2017 7  % Final  . Monocytes Absolute 05/27/2017 0.4  0.2 - 0.9 K/uL Final  . Eosinophils Relative 05/27/2017 2  % Final  . Eosinophils Absolute 05/27/2017 0.1  0 - 0.7 K/uL Final  . Basophils Relative 05/27/2017 0  % Final  . Basophils Absolute 05/27/2017 0.0  0 - 0.1 K/uL Final  . Ferritin 05/27/2017 39  11 - 307 ng/mL Final    Assessment:  Latasha [name] is a 31 y.o. female with chronic constipation and iron deficiency anemia.  Ferritin was 3 on 10/04/2014 consistent with iron deficiency.  She denies any melena or hematochezia.  She has a history of heavy menses felt secondary to endometriosis.  Menses is now light on BCP.  EGD was normal in 08/2016.  Colonoscopy was normal in 09/2015.  She has a history of microscopic hematuria.  CT hematuria work-up on 04/29/2017 was negative.  She is followed by urology.  Work-up on 03/08/2015 confirmed iron deficiency (ferritin 5). B12 and folate were normal.   She has received Venofer 600 mg (03/15/2015 - 04/19/2015), x 2 (08/09/2015 - 08/16/2015), x 1 (10/31/2015), x 2 (05/08/2016 - 05/15/2016), and x 2 (03/05/2017 - 03/12/2017). She receives Venofer if ferritin is < 50 with symptoms.  Ferritin has been followed: 5 on 03/08/2015, 28 on 03/29/2015, 64 on 04/26/2015, 9 on 07/26/2015, 40 on 08/23/2015, 35 on 09/20/2015 29 on 10/25/2015, 32 on 02/07/2016, 36 on 05/08/2016, 39 on 02/17/2017 and 39 on 05/27/2017  She has a son with factor IX deficiency.  Another son is being evaluated for von Willebrand's disease.  Work-up on 03/08/2015 revealed a normal platelet count, PT, PTT, von Willebrand panel, and multimers.  She has a history of microscopic hematuria.  CT hematuria workup on 04/29/2017 revealed no findings to account for the patient's microscopic hematuria. She is followed by urology. Repeat urinalysis revealed no microscopic hematuria.  Symptomatically, she is fatigued.  She has  ice pica x 2 months.  She has large joint pain.  Exam is unremarkable.  Hematocrit 39.6 and MCV 92.8 (normal).  Ferritin is 39.  Plan: 1.  Review labs from 05/27/2017.  Hematocrit and MCV normal.  Ferritin <  50.  Patient is fatigued and requests IV iron. 2.  Discuss continuation of intravenous iron. Will schedule for Venofer  x 2. 3.  Refer to rheumatology for further evaluation of positive ANA.  4.  RTC in 3 months for labs (CBC with diff, ferritin, ANA with reflex). 5.  RTC in 6 months for MD assessment, labs (CBC with diff, ferritin - the day before), +/- ferritin   Quentin Mulling, NP  06/04/2017, 9:19 AM   I saw and evaluated the patient, participating in the key portions of the service and reviewing pertinent diagnostic studies and records.  I reviewed the nurse practitioner's note and agree with the findings and the plan.  The assessment and plan were discussed with the patient.  A few multiple questions were asked by the patient and answered.   Nelva Nay, MD 06/04/2017,9:19 AM

## 2017-06-04 ENCOUNTER — Encounter: Payer: Self-pay | Admitting: Urgent Care

## 2017-06-04 ENCOUNTER — Inpatient Hospital Stay: Payer: Medicare Other

## 2017-06-04 ENCOUNTER — Encounter: Payer: Self-pay | Admitting: Hematology and Oncology

## 2017-06-04 ENCOUNTER — Inpatient Hospital Stay (HOSPITAL_BASED_OUTPATIENT_CLINIC_OR_DEPARTMENT_OTHER): Payer: Medicare Other | Admitting: Hematology and Oncology

## 2017-06-04 VITALS — BP 115/77 | HR 55 | Temp 97.5°F | Resp 18 | Ht 65.0 in | Wt 132.7 lb

## 2017-06-04 VITALS — BP 107/68 | HR 50 | Temp 97.0°F | Resp 18

## 2017-06-04 DIAGNOSIS — F419 Anxiety disorder, unspecified: Secondary | ICD-10-CM

## 2017-06-04 DIAGNOSIS — D508 Other iron deficiency anemias: Secondary | ICD-10-CM

## 2017-06-04 DIAGNOSIS — J45909 Unspecified asthma, uncomplicated: Secondary | ICD-10-CM

## 2017-06-04 DIAGNOSIS — M2550 Pain in unspecified joint: Secondary | ICD-10-CM

## 2017-06-04 DIAGNOSIS — K219 Gastro-esophageal reflux disease without esophagitis: Secondary | ICD-10-CM

## 2017-06-04 DIAGNOSIS — Z79899 Other long term (current) drug therapy: Secondary | ICD-10-CM

## 2017-06-04 DIAGNOSIS — D509 Iron deficiency anemia, unspecified: Secondary | ICD-10-CM | POA: Diagnosis not present

## 2017-06-04 DIAGNOSIS — R5383 Other fatigue: Secondary | ICD-10-CM

## 2017-06-04 DIAGNOSIS — F5089 Other specified eating disorder: Secondary | ICD-10-CM

## 2017-06-04 DIAGNOSIS — R3129 Other microscopic hematuria: Secondary | ICD-10-CM

## 2017-06-04 DIAGNOSIS — K5900 Constipation, unspecified: Secondary | ICD-10-CM

## 2017-06-04 DIAGNOSIS — Q431 Hirschsprung's disease: Secondary | ICD-10-CM | POA: Diagnosis not present

## 2017-06-04 MED ORDER — IRON SUCROSE 20 MG/ML IV SOLN
100.0000 mg | Freq: Once | INTRAVENOUS | Status: AC
  Administered 2017-06-04: 100 mg via INTRAVENOUS
  Filled 2017-06-04: qty 5

## 2017-06-04 MED ORDER — IRON SUCROSE 20 MG/ML IV SOLN
INTRAVENOUS | Status: AC
Start: 2017-06-04 — End: 2017-06-04
  Filled 2017-06-04: qty 10

## 2017-06-04 NOTE — Progress Notes (Signed)
Patient here for iron def. Anemia. She reports minor constipation.reports intermittent use of fleets enema. No longer using miralax. She reports fatigue. Denies any N&V.

## 2017-06-04 NOTE — Patient Instructions (Signed)
Iron Sucrose injection What is this medicine? IRON SUCROSE (AHY ern SOO krohs) is an iron complex. Iron is used to make healthy red blood cells, which carry oxygen and nutrients throughout the body. This medicine is used to treat iron deficiency anemia in people with chronic kidney disease. This medicine may be used for other purposes; ask your health care provider or pharmacist if you have questions. COMMON BRAND NAME(S): Venofer What should I tell my health care provider before I take this medicine? They need to know if you have any of these conditions: -anemia not caused by low iron levels -heart disease -high levels of iron in the blood -kidney disease -liver disease -an unusual or allergic reaction to iron, other medicines, foods, dyes, or preservatives -pregnant or trying to get pregnant -breast-feeding How should I use this medicine? This medicine is for infusion into a vein. It is given by a health care professional in a hospital or clinic setting. Talk to your pediatrician regarding the use of this medicine in children. While this drug may be prescribed for children as young as 2 years for selected conditions, precautions do apply. Overdosage: If you think you have taken too much of this medicine contact a poison control center or emergency room at once. NOTE: This medicine is only for you. Do not share this medicine with others. What if I miss a dose? It is important not to miss your dose. Call your doctor or health care professional if you are unable to keep an appointment. What may interact with this medicine? Do not take this medicine with any of the following medications: -deferoxamine -dimercaprol -other iron products This medicine may also interact with the following medications: -chloramphenicol -deferasirox This list may not describe all possible interactions. Give your health care provider a list of all the medicines, herbs, non-prescription drugs, or dietary  supplements you use. Also tell them if you smoke, drink alcohol, or use illegal drugs. Some items may interact with your medicine. What should I watch for while using this medicine? Visit your doctor or healthcare professional regularly. Tell your doctor or healthcare professional if your symptoms do not start to get better or if they get worse. You may need blood work done while you are taking this medicine. You may need to follow a special diet. Talk to your doctor. Foods that contain iron include: whole grains/cereals, dried fruits, beans, or peas, leafy green vegetables, and organ meats (liver, kidney). What side effects may I notice from receiving this medicine? Side effects that you should report to your doctor or health care professional as soon as possible: -allergic reactions like skin rash, itching or hives, swelling of the face, lips, or tongue -breathing problems -changes in blood pressure -cough -fast, irregular heartbeat -feeling faint or lightheaded, falls -fever or chills -flushing, sweating, or hot feelings -joint or muscle aches/pains -seizures -swelling of the ankles or feet -unusually weak or tired Side effects that usually do not require medical attention (report to your doctor or health care professional if they continue or are bothersome): -diarrhea -feeling achy -headache -irritation at site where injected -nausea, vomiting -stomach upset -tiredness This list may not describe all possible side effects. Call your doctor for medical advice about side effects. You may report side effects to FDA at 1-800-FDA-1088. Where should I keep my medicine? This drug is given in a hospital or clinic and will not be stored at home. NOTE: This sheet is a summary. It may not cover all possible information. If   you have questions about this medicine, talk to your doctor, pharmacist, or health care provider.  2018 Elsevier/Gold Standard (2011-05-16 17:14:35)  

## 2017-06-04 NOTE — Telephone Encounter (Signed)
This encounter was created in error - please disregard.

## 2017-06-11 ENCOUNTER — Inpatient Hospital Stay: Payer: Medicare Other

## 2017-06-11 VITALS — BP 116/72 | HR 55 | Temp 97.6°F | Resp 18

## 2017-06-11 DIAGNOSIS — D508 Other iron deficiency anemias: Secondary | ICD-10-CM

## 2017-06-11 DIAGNOSIS — D509 Iron deficiency anemia, unspecified: Secondary | ICD-10-CM | POA: Diagnosis not present

## 2017-06-11 DIAGNOSIS — M2550 Pain in unspecified joint: Secondary | ICD-10-CM

## 2017-06-11 MED ORDER — IRON SUCROSE 20 MG/ML IV SOLN
100.0000 mg | Freq: Once | INTRAVENOUS | Status: AC
  Administered 2017-06-11: 100 mg via INTRAVENOUS
  Filled 2017-06-11: qty 5

## 2017-06-11 MED ORDER — SODIUM CHLORIDE 0.9 % IV SOLN
Freq: Once | INTRAVENOUS | Status: AC
  Administered 2017-06-11: 09:00:00 via INTRAVENOUS
  Filled 2017-06-11: qty 1000

## 2017-06-12 LAB — ANA W/REFLEX: ANA: NEGATIVE

## 2017-09-03 ENCOUNTER — Inpatient Hospital Stay: Payer: Medicare Other | Attending: Hematology and Oncology

## 2017-09-03 DIAGNOSIS — D508 Other iron deficiency anemias: Secondary | ICD-10-CM

## 2017-09-03 DIAGNOSIS — D509 Iron deficiency anemia, unspecified: Secondary | ICD-10-CM | POA: Insufficient documentation

## 2017-09-03 DIAGNOSIS — M2550 Pain in unspecified joint: Secondary | ICD-10-CM

## 2017-09-03 LAB — CBC WITH DIFFERENTIAL/PLATELET
Basophils Absolute: 0 10*3/uL (ref 0–0.1)
Basophils Relative: 1 %
Eosinophils Absolute: 0.1 10*3/uL (ref 0–0.7)
Eosinophils Relative: 2 %
HCT: 38.4 % (ref 35.0–47.0)
Hemoglobin: 13 g/dL (ref 12.0–16.0)
Lymphocytes Relative: 34 %
Lymphs Abs: 1.9 10*3/uL (ref 1.0–3.6)
MCH: 31.1 pg (ref 26.0–34.0)
MCHC: 34 g/dL (ref 32.0–36.0)
MCV: 91.7 fL (ref 80.0–100.0)
Monocytes Absolute: 0.4 10*3/uL (ref 0.2–0.9)
Monocytes Relative: 7 %
Neutro Abs: 3.2 10*3/uL (ref 1.4–6.5)
Neutrophils Relative %: 56 %
Platelets: 188 10*3/uL (ref 150–440)
RBC: 4.18 MIL/uL (ref 3.80–5.20)
RDW: 13.2 % (ref 11.5–14.5)
WBC: 5.6 10*3/uL (ref 3.6–11.0)

## 2017-09-03 LAB — FERRITIN: Ferritin: 42 ng/mL (ref 11–307)

## 2017-09-04 ENCOUNTER — Telehealth: Payer: Self-pay | Admitting: *Deleted

## 2017-09-04 NOTE — Telephone Encounter (Signed)
-----   Message from Rosey BathMelissa C Corcoran, MD sent at 09/03/2017  4:32 PM EST ----- Regarding: Please call patient  Ferritin 42.  She typically receives Venofer if ferritin is < 50 with symptoms.  If symptoms, Venofer weekly x 2.  M   ----- Message ----- From: Interface, [facility] [place] Sent: 09/03/2017  11:26 AM To: Rosey BathMelissa C Corcoran, MD

## 2017-09-04 NOTE — Telephone Encounter (Signed)
Called patient and LVM that her ferritin is 42.  Typically she gets venofer if she ia <50 and having symptoms.  Patient advised to call back to let me know if she is having symptoms (fatigue, weakness, SOB).  If she is having symptoms she can get venofer weekly X2.

## 2017-11-04 NOTE — Progress Notes (Deleted)
11/05/2017 8:33 PM   Latasha HandingChelsea E [name] Jul 21, 1986 [id number]  Referring provider: Rayetta HumphreyGeorge, Sionne A, MD [address] [address] [place], [place] [id number]  No chief complaint on file.   HPI: 32 yo WF with a history of hematuria who presents today for a 6 month follow up.  History of hematuria CTU completed on 04/29/2017 was negative.  Per AUA guidelines cystoscopy was not performed as she is not 32 years of age or older and has low risk factors for bladder cancer.  She has not episodes of gross hematuria.  Her UA today is ***.    Today, she is not experiencing gross hematuria, suprapubic pain or dysuria. She's not had any fevers, chills, nausea or vomiting..   Her UA was negative.       PMH: Past Medical History:  Diagnosis Date  . Acid reflux   . Anemia   . Anxiety   . Asthma   . Chronic constipation   . Hirschsprung disease   . Pelvic floor dysfunction   . Syncope, cardiogenic     Surgical History: Past Surgical History:  Procedure Laterality Date  . abdomial adhesions removed  2012  . APPENDECTOMY  1997  . bladder repair surgery  1997  . c- sections  [id number]  . enodmetiosis removed  2012  . OVARIAN CYST REMOVAL  2003, 2012  . TONSILLECTOMY  2007    Home Medications:  Allergies as of 11/05/2017      Reactions   Meperidine Anaphylaxis   Other reaction(s): Other (See Comments) Other Reaction: Not Assessed   Lactose    Other reaction(s): Other (See Comments) Other Reaction: GI Upset   Fludrocortisone Hives   Levofloxacin Hives   Sulfamethoxazole-trimethoprim Other (See Comments)   Other reaction(s): Dizziness      Medication List        Accurate as of 11/04/17  8:33 PM. Always use your most recent med list.          CULTURELLE IMMUNE DEFENSE PO Take by mouth.   EPIPEN 2-PAK 0.3 mg/0.3 mL Soaj injection Generic drug:  EPINEPHrine Inject 0.3 mg into the muscle as needed. Reported on 02/07/2016   JUNEL 1/20 1-20 MG-MCG tablet Generic drug:   norethindrone-ethinyl estradiol Take 1 tablet by mouth daily.   LEVSIN/SL 0.125 MG SL tablet Generic drug:  hyoscyamine Take 0.125 mg by mouth every 6 (six) hours as needed for cramping.   midodrine 10 MG tablet Commonly known as:  PROAMATINE Take 10 mg by mouth daily.   omeprazole 40 MG capsule Commonly known as:  PRILOSEC 1 capsule by mouth daily   ondansetron 4 MG tablet Commonly known as:  ZOFRAN Take 4 mg by mouth every 8 (eight) hours as needed for nausea.   Plecanatide 3 MG Tabs Take 3 mg by mouth daily.   PROAIR HFA 108 (90 Base) MCG/ACT inhaler Generic drug:  albuterol Inhale 1 puff into the lungs every 6 (six) hours as needed for wheezing or shortness of breath.   prochlorperazine 10 MG tablet Commonly known as:  COMPAZINE Take 10 mg by mouth every 8 (eight) hours as needed for nausea or vomiting.   promethazine 25 MG tablet Commonly known as:  PHENERGAN Take 25 mg by mouth every 8 (eight) hours as needed for vomiting.   RA ENEMA 7-19 GM/118ML Enem Place rectally as needed (constipation).   sertraline 100 MG tablet Commonly known as:  ZOLOFT Take 100 mg by mouth daily.   ZOLOFT 100 MG tablet Generic  drug:  sertraline Take 100 mg by mouth daily.       Allergies:  Allergies  Allergen Reactions  . Meperidine Anaphylaxis    Other reaction(s): Other (See Comments) Other Reaction: Not Assessed  . Lactose     Other reaction(s): Other (See Comments) Other Reaction: GI Upset  . Fludrocortisone Hives  . Levofloxacin Hives  . Sulfamethoxazole-Trimethoprim Other (See Comments)    Other reaction(s): Dizziness    Family History: Family History  Problem Relation Age of Onset  . Cancer Mother   . Diabetes Mother   . Hypertension Mother   . Cancer Father   . Cancer Maternal Grandfather   . Factor IX deficiency Maternal Grandfather   . Factor IX deficiency Son   . Kidney cancer Neg Hx   . Bladder Cancer Neg Hx     Social History:  reports that  has  never smoked. she has never used smokeless tobacco. She reports that she does not drink alcohol or use drugs.  ROS:                                        Physical Exam: There were no vitals taken for this visit.  Constitutional: Well nourished. Alert and oriented, No acute distress. HEENT: [place] AT, moist mucus membranes. Trachea midline, no masses. Cardiovascular: No clubbing, cyanosis, or edema. Respiratory: Normal respiratory effort, no increased work of breathing. GI: Abdomen is soft, non tender, non distended, no abdominal masses. Liver and spleen not palpable.  No hernias appreciated.  Stool sample for occult testing is not indicated.   GU: No CVA tenderness.  No bladder fullness or masses.   Skin: No rashes, bruises or suspicious lesions. Lymph: No cervical or inguinal adenopathy. Neurologic: Grossly intact, no focal deficits, moving all 4 extremities. Psychiatric: Normal mood and affect. ***  Laboratory Data: Lab Results  Component Value Date   WBC 5.6 09/03/2017   HGB 13.0 09/03/2017   HCT 38.4 09/03/2017   MCV 91.7 09/03/2017   PLT 188 09/03/2017    Urinalysis Unremarkable.  See EPIC.  I have reviewed the labs  Assessment & Plan:    1. History of hematuria  - CTU completed in 04/2017- no malignancies seen  - UA - no hematuria  - RTC to 12 months for recheck and UA  - Patient to return for any gross hematuria      No Follow-up on file.  These notes generated with voice recognition software. I apologize for typographical errors.  Michiel Cowboy, PA-C  [facility] [address] [place], [place] [phone]

## 2017-11-05 ENCOUNTER — Encounter: Payer: Self-pay | Admitting: Urology

## 2017-11-05 ENCOUNTER — Ambulatory Visit: Payer: Medicare Other | Admitting: Urology

## 2017-12-02 ENCOUNTER — Other Ambulatory Visit: Payer: Medicare Other

## 2017-12-03 ENCOUNTER — Ambulatory Visit: Payer: Medicare Other

## 2017-12-03 ENCOUNTER — Ambulatory Visit: Payer: Medicare Other | Admitting: Hematology and Oncology

## 2017-12-23 ENCOUNTER — Inpatient Hospital Stay: Payer: Medicare Other | Attending: Hematology and Oncology

## 2017-12-23 DIAGNOSIS — J45909 Unspecified asthma, uncomplicated: Secondary | ICD-10-CM | POA: Diagnosis not present

## 2017-12-23 DIAGNOSIS — Q431 Hirschsprung's disease: Secondary | ICD-10-CM | POA: Insufficient documentation

## 2017-12-23 DIAGNOSIS — F419 Anxiety disorder, unspecified: Secondary | ICD-10-CM | POA: Diagnosis not present

## 2017-12-23 DIAGNOSIS — Z79899 Other long term (current) drug therapy: Secondary | ICD-10-CM | POA: Insufficient documentation

## 2017-12-23 DIAGNOSIS — D508 Other iron deficiency anemias: Secondary | ICD-10-CM

## 2017-12-23 DIAGNOSIS — F5089 Other specified eating disorder: Secondary | ICD-10-CM | POA: Insufficient documentation

## 2017-12-23 DIAGNOSIS — R5383 Other fatigue: Secondary | ICD-10-CM | POA: Insufficient documentation

## 2017-12-23 DIAGNOSIS — Z809 Family history of malignant neoplasm, unspecified: Secondary | ICD-10-CM | POA: Insufficient documentation

## 2017-12-23 DIAGNOSIS — M2550 Pain in unspecified joint: Secondary | ICD-10-CM

## 2017-12-23 DIAGNOSIS — K5900 Constipation, unspecified: Secondary | ICD-10-CM | POA: Diagnosis not present

## 2017-12-23 DIAGNOSIS — D509 Iron deficiency anemia, unspecified: Secondary | ICD-10-CM | POA: Diagnosis present

## 2017-12-23 DIAGNOSIS — K219 Gastro-esophageal reflux disease without esophagitis: Secondary | ICD-10-CM | POA: Insufficient documentation

## 2017-12-23 LAB — CBC WITH DIFFERENTIAL/PLATELET
BASOS ABS: 0 10*3/uL (ref 0–0.1)
Basophils Relative: 1 %
Eosinophils Absolute: 0.1 10*3/uL (ref 0–0.7)
Eosinophils Relative: 2 %
HCT: 38.7 % (ref 35.0–47.0)
Hemoglobin: 13 g/dL (ref 12.0–16.0)
LYMPHS ABS: 1.7 10*3/uL (ref 1.0–3.6)
LYMPHS PCT: 33 %
MCH: 30.9 pg (ref 26.0–34.0)
MCHC: 33.6 g/dL (ref 32.0–36.0)
MCV: 92 fL (ref 80.0–100.0)
MONO ABS: 0.4 10*3/uL (ref 0.2–0.9)
Monocytes Relative: 9 %
Neutro Abs: 2.8 10*3/uL (ref 1.4–6.5)
Neutrophils Relative %: 55 %
PLATELETS: 182 10*3/uL (ref 150–440)
RBC: 4.2 MIL/uL (ref 3.80–5.20)
RDW: 13.5 % (ref 11.5–14.5)
WBC: 5.1 10*3/uL (ref 3.6–11.0)

## 2017-12-23 LAB — FERRITIN: FERRITIN: 32 ng/mL (ref 11–307)

## 2017-12-24 ENCOUNTER — Other Ambulatory Visit: Payer: Self-pay | Admitting: Hematology and Oncology

## 2017-12-24 ENCOUNTER — Encounter: Payer: Self-pay | Admitting: Hematology and Oncology

## 2017-12-24 ENCOUNTER — Inpatient Hospital Stay (HOSPITAL_BASED_OUTPATIENT_CLINIC_OR_DEPARTMENT_OTHER): Payer: Medicare Other | Admitting: Hematology and Oncology

## 2017-12-24 ENCOUNTER — Inpatient Hospital Stay: Payer: Medicare Other

## 2017-12-24 DIAGNOSIS — Z809 Family history of malignant neoplasm, unspecified: Secondary | ICD-10-CM

## 2017-12-24 DIAGNOSIS — Z79899 Other long term (current) drug therapy: Secondary | ICD-10-CM | POA: Diagnosis not present

## 2017-12-24 DIAGNOSIS — K219 Gastro-esophageal reflux disease without esophagitis: Secondary | ICD-10-CM

## 2017-12-24 DIAGNOSIS — F419 Anxiety disorder, unspecified: Secondary | ICD-10-CM

## 2017-12-24 DIAGNOSIS — F5089 Other specified eating disorder: Secondary | ICD-10-CM | POA: Diagnosis not present

## 2017-12-24 DIAGNOSIS — D509 Iron deficiency anemia, unspecified: Secondary | ICD-10-CM | POA: Diagnosis not present

## 2017-12-24 DIAGNOSIS — Q431 Hirschsprung's disease: Secondary | ICD-10-CM

## 2017-12-24 DIAGNOSIS — K5900 Constipation, unspecified: Secondary | ICD-10-CM | POA: Diagnosis not present

## 2017-12-24 DIAGNOSIS — R5383 Other fatigue: Secondary | ICD-10-CM

## 2017-12-24 DIAGNOSIS — J45909 Unspecified asthma, uncomplicated: Secondary | ICD-10-CM | POA: Diagnosis not present

## 2017-12-24 DIAGNOSIS — D508 Other iron deficiency anemias: Secondary | ICD-10-CM

## 2017-12-24 LAB — PREGNANCY, URINE: PREG TEST UR: NEGATIVE

## 2017-12-24 MED ORDER — SODIUM CHLORIDE 0.9 % IV SOLN
Freq: Once | INTRAVENOUS | Status: AC
  Administered 2017-12-24: 10:00:00 via INTRAVENOUS
  Filled 2017-12-24: qty 1000

## 2017-12-24 MED ORDER — IRON SUCROSE 20 MG/ML IV SOLN
100.0000 mg | Freq: Once | INTRAVENOUS | Status: AC
  Administered 2017-12-24: 100 mg via INTRAVENOUS
  Filled 2017-12-24: qty 5

## 2017-12-24 NOTE — Patient Instructions (Signed)
Iron Sucrose injection What is this medicine? IRON SUCROSE (AHY ern SOO krohs) is an iron complex. Iron is used to make healthy red blood cells, which carry oxygen and nutrients throughout the body. This medicine is used to treat iron deficiency anemia in people with chronic kidney disease. This medicine may be used for other purposes; ask your health care provider or pharmacist if you have questions. COMMON BRAND NAME(S): Venofer What should I tell my health care provider before I take this medicine? They need to know if you have any of these conditions: -anemia not caused by low iron levels -heart disease -high levels of iron in the blood -kidney disease -liver disease -an unusual or allergic reaction to iron, other medicines, foods, dyes, or preservatives -pregnant or trying to get pregnant -breast-feeding How should I use this medicine? This medicine is for infusion into a vein. It is given by a health care professional in a hospital or clinic setting. Talk to your pediatrician regarding the use of this medicine in children. While this drug may be prescribed for children as young as 2 years for selected conditions, precautions do apply. Overdosage: If you think you have taken too much of this medicine contact a poison control center or emergency room at once. NOTE: This medicine is only for you. Do not share this medicine with others. What if I miss a dose? It is important not to miss your dose. Call your doctor or health care professional if you are unable to keep an appointment. What may interact with this medicine? Do not take this medicine with any of the following medications: -deferoxamine -dimercaprol -other iron products This medicine may also interact with the following medications: -chloramphenicol -deferasirox This list may not describe all possible interactions. Give your health care provider a list of all the medicines, herbs, non-prescription drugs, or dietary  supplements you use. Also tell them if you smoke, drink alcohol, or use illegal drugs. Some items may interact with your medicine. What should I watch for while using this medicine? Visit your doctor or healthcare professional regularly. Tell your doctor or healthcare professional if your symptoms do not start to get better or if they get worse. You may need blood work done while you are taking this medicine. You may need to follow a special diet. Talk to your doctor. Foods that contain iron include: whole grains/cereals, dried fruits, beans, or peas, leafy green vegetables, and organ meats (liver, kidney). What side effects may I notice from receiving this medicine? Side effects that you should report to your doctor or health care professional as soon as possible: -allergic reactions like skin rash, itching or hives, swelling of the face, lips, or tongue -breathing problems -changes in blood pressure -cough -fast, irregular heartbeat -feeling faint or lightheaded, falls -fever or chills -flushing, sweating, or hot feelings -joint or muscle aches/pains -seizures -swelling of the ankles or feet -unusually weak or tired Side effects that usually do not require medical attention (report to your doctor or health care professional if they continue or are bothersome): -diarrhea -feeling achy -headache -irritation at site where injected -nausea, vomiting -stomach upset -tiredness This list may not describe all possible side effects. Call your doctor for medical advice about side effects. You may report side effects to FDA at 1-800-FDA-1088. Where should I keep my medicine? This drug is given in a hospital or clinic and will not be stored at home. NOTE: This sheet is a summary. It may not cover all possible information. If   you have questions about this medicine, talk to your doctor, pharmacist, or health care provider.  2018 Elsevier/Gold Standard (2011-05-16 17:14:35)  

## 2017-12-24 NOTE — Progress Notes (Signed)
Patient offers no complaints today. 

## 2017-12-24 NOTE — Progress Notes (Signed)
[facility]-  Cancer Center  Clinic day:  12/24/2017   Chief Complaint: Latasha [name] is a 32 y.o. 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  HPI: The patient was last seen in the medical oncology clinic on 06/04/2017.  At that time,  she was fatigued.  She had ice pica x 2 months.  She had large joint pain.  Exam was unremarkable.  Hematocrit was 39.6 and MCV 92.8 (normal).  Ferritin was 39.  She received Venofer x 2 (06/04/2017 - 06/11/2017).  CBC on 09/03/2017:  Hematocrit 38.4, hemoglobin 13.0, and MCV 91.7.  Ferritin was 42. CBC on 12/23/2017:  Hematocrit 38.7, hemoglobin 13.0, and MCV 92.0.  Ferritin was 32.  During the interim, patient has more fatigued as of late. She notes that she is exhausted no matter how much sleep she gets. Patient is "chewing ice more". Patient denies bleeding; no hematochezia, melena, or gross hematuria. She denies restless legs.   Patient is eating well. She denies significant weight loss. Patient denies pain in the clinic today.    Past Medical History:  Diagnosis Date  . Acid reflux   . Anemia   . Anxiety   . Asthma   . Chronic constipation   . Hirschsprung disease   . Pelvic floor dysfunction   . Syncope, cardiogenic     Past Surgical History:  Procedure Laterality Date  . abdomial adhesions removed  2012  . APPENDECTOMY  1997  . bladder repair surgery  1997  . c- sections  [id number]  . enodmetiosis removed  2012  . OVARIAN CYST REMOVAL  2003, 2012  . TONSILLECTOMY  2007    Family History  Problem Relation Age of Onset  . Cancer Mother   . Diabetes Mother   . Hypertension Mother   . Cancer Father   . Cancer Maternal Grandfather   . Factor IX deficiency Maternal Grandfather   . Factor IX deficiency Son   . Kidney cancer Neg Hx   . Bladder Cancer Neg Hx     Social History:  reports that she has never smoked. She has never used smokeless tobacco. She  reports that she does not drink alcohol or use drugs.  The patient is accompanied by her husband today.  Allergies:  Allergies  Allergen Reactions  . Meperidine Anaphylaxis    Other reaction(s): Other (See Comments) Other Reaction: Not Assessed  . Lactose     Other reaction(s): Other (See Comments) Other Reaction: GI Upset  . Fludrocortisone Hives  . Levofloxacin Hives  . Sulfamethoxazole-Trimethoprim Other (See Comments)    Other reaction(s): Dizziness    Current Medications: Current Outpatient Medications  Medication Sig Dispense Refill  . albuterol (PROAIR HFA) 108 (90 Base) MCG/ACT inhaler Inhale 1 puff into the lungs every 6 (six) hours as needed for wheezing or shortness of breath.     . EPINEPHrine (EPIPEN 2-PAK) 0.3 mg/0.3 mL IJ SOAJ injection Inject 0.3 mg into the muscle as needed. Reported on 02/07/2016    . hyoscyamine (LEVSIN/SL) 0.125 MG SL tablet Take 0.125 mg by mouth every 6 (six) hours as needed for cramping.     . midodrine (PROAMATINE) 10 MG tablet Take 10 mg by mouth daily.     . norethindrone-ethinyl estradiol (JUNEL 1/20) 1-20 MG-MCG tablet Take 1 tablet by mouth daily.    Marland Kitchen omeprazole (PRILOSEC) 40 MG capsule 1 capsule by mouth daily  0  . ondansetron (ZOFRAN) 4 MG  tablet Take 4 mg by mouth every 8 (eight) hours as needed for nausea.     . Plecanatide 3 MG TABS Take 3 mg by mouth daily.     . Probiotic Product (CULTURELLE IMMUNE DEFENSE PO) Take by mouth.    . prochlorperazine (COMPAZINE) 10 MG tablet Take 10 mg by mouth every 8 (eight) hours as needed for nausea or vomiting.     . promethazine (PHENERGAN) 25 MG tablet Take 25 mg by mouth every 8 (eight) hours as needed for vomiting.     . sertraline (ZOLOFT) 100 MG tablet Take 100 mg by mouth daily.    . Sodium Phosphates (RA ENEMA) 7-19 GM/118ML ENEM Place rectally as needed (constipation).     . sertraline (ZOLOFT) 100 MG tablet Take 100 mg by mouth daily.      No current facility-administered medications  for this visit.     Review of Systems  Constitutional: Positive for malaise/fatigue. Negative for diaphoresis, fever and weight loss.  HENT: Negative.   Eyes: Negative.   Respiratory: Negative for cough, hemoptysis, sputum production and shortness of breath.   Cardiovascular: Negative for chest pain, palpitations, orthopnea, leg swelling and PND.  Gastrointestinal: Positive for constipation. Negative for abdominal pain, blood in stool, diarrhea, melena, nausea and vomiting.       Ice pica  Genitourinary: Negative for dysuria, frequency, hematuria and urgency.  Musculoskeletal: Negative for back pain, falls, joint pain and myalgias.  Skin: Negative for itching and rash.  Neurological: Negative for dizziness, tremors, weakness and headaches.  Endo/Heme/Allergies: Bruises/bleeds easily.  Psychiatric/Behavioral: Negative for depression, memory loss and suicidal ideas. The patient is not nervous/anxious and does not have insomnia.   All other systems reviewed and are negative.  PERFORMANCE STATUS (ECOG):  1  Physical Exam: SpO2 100 %. GENERAL:  Thin woman sitting comfortably in the exam room in no acute distress. MENTAL STATUS:  Alert and oriented to person, place and time. HEAD:  Long brown hair.  Normocephalic, atraumatic, face symmetric, no Cushingoid features. EYES:  Blue eyes.  Pupils equal round and reactive to light and accomodation.  No conjunctivitis or scleral icterus. ENT:  Oropharynx clear without lesion.  Tongue normal. Mucous membranes moist.  RESPIRATORY:  Clear to auscultation without rales, wheezes or rhonchi. CARDIOVASCULAR:  Regular rate and rhythm without murmur, rub or gallop. ABDOMEN:  Soft, non-tender, with active bowel sounds, and no hepatosplenomegaly.  No masses. SKIN:  Tan.  No rashes, ulcers or lesions. EXTREMITIES: No edema, no skin discoloration or tenderness.  No palpable cords. LYMPH NODES: No palpable cervical, supraclavicular, axillary or inguinal  adenopathy  NEUROLOGICAL: Unremarkable. PSYCH:  Appropriate.   Appointment on 12/23/2017  Component Date Value Ref Range Status  . Ferritin 12/23/2017 32  11 - 307 ng/mL Final   Performed at [facility], [address] [place] [place], [place] [address]  . WBC 12/23/2017 5.1  3.6 - 11.0 K/uL Final  . RBC 12/23/2017 4.20  3.80 - 5.20 MIL/uL Final  . Hemoglobin 12/23/2017 13.0  12.0 - 16.0 g/dL Final  . HCT 60/45/4098 38.7  35.0 - 47.0 % Final  . MCV 12/23/2017 92.0  80.0 - 100.0 fL Final  . MCH 12/23/2017 30.9  26.0 - 34.0 pg Final  . MCHC 12/23/2017 33.6  32.0 - 36.0 g/dL Final  . RDW 11/91/4782 13.5  11.5 - 14.5 % Final  . Platelets 12/23/2017 182  150 - 440 K/uL Final  . Neutrophils Relative % 12/23/2017 55  % Final  .  Neutro Abs 12/23/2017 2.8  1.4 - 6.5 K/uL Final  . Lymphocytes Relative 12/23/2017 33  % Final  . Lymphs Abs 12/23/2017 1.7  1.0 - 3.6 K/uL Final  . Monocytes Relative 12/23/2017 9  % Final  . Monocytes Absolute 12/23/2017 0.4  0.2 - 0.9 K/uL Final  . Eosinophils Relative 12/23/2017 2  % Final  . Eosinophils Absolute 12/23/2017 0.1  0 - 0.7 K/uL Final  . Basophils Relative 12/23/2017 1  % Final  . Basophils Absolute 12/23/2017 0.0  0 - 0.1 K/uL Final   Performed at [facility], [address], [place], [place] [address]    Assessment:  BRUNILDA EBLE is a 32 y.o. female with chronic constipation and iron deficiency anemia.  Ferritin was 3 on 10/04/2014 consistent with iron deficiency.  She denies any melena or hematochezia.  She has a history of heavy menses felt secondary to endometriosis.  Menses is now light on BCP.  EGD was normal in 08/2016.  Colonoscopy was normal in 09/2015.  She has a history of microscopic hematuria.  CT hematuria work-up on 04/29/2017 was negative.  She is followed by urology.  Work-up on 03/08/2015 confirmed iron deficiency (ferritin 5). B12 and folate were normal.   She has received Venofer 600 mg (03/15/2015 -  04/19/2015), x 2 (08/09/2015 - 08/16/2015), x 1 (10/31/2015), x 2 (05/08/2016 - 05/15/2016), and x 2 (03/05/2017 - 03/12/2017). She receives Venofer if ferritin is < 50 with symptoms.  Ferritin has been followed: 5 on 03/08/2015, 28 on 03/29/2015, 64 on 04/26/2015, 9 on 07/26/2015, 40 on 08/23/2015, 35 on 09/20/2015 29 on 10/25/2015, 32 on 02/07/2016, 36 on 05/08/2016, 39 on 02/17/2017, 39 on 05/27/2017, 42 on 09/03/2017, and 32 on 12/23/2017.  She has a son with factor IX deficiency.  Another son is being evaluated for von Willebrand's disease.  Work-up on 03/08/2015 revealed a normal platelet count, PT, PTT, von Willebrand panel, and multimers.  She has a history of microscopic hematuria.  CT hematuria workup on 04/29/2017 revealed no findings to account for the patient's microscopic hematuria. She is followed by urology. Repeat urinalysis revealed no microscopic hematuria.  Symptomatically, she is fatigued. Patient is becoming more symptomatic. She has ice pica.  Exam is unremarkable.   Plan: 1.  Review labs from 12/23/2017.  Hematocrit and MCV normal.  Ferritin < 50.  Patient is fatigued and requests IV iron. 2.  Discuss iron stores. Ferritin down 32. Check urine pregnancy. Will schedule for Venofer 100 mg x 3. 3.  Discuss referral to rheumatology for further evaluation of positive ANA.  ANA has been retested and found to be negative her patient.  4.  RTC in 3 months for labs (CBC with diff, ferritin, ANA with reflex). 5.  RTC in 6 months for MD assessment, labs (CBC with diff, ferritin - the day before), +/- ferritin   Quentin Mulling, NP  12/24/2017, 9:37 AM   I saw and evaluated the patient, participating in the key portions of the service and reviewing pertinent diagnostic studies and records.  I reviewed the nurse practitioner's note and agree with the findings and the plan.  The assessment and plan were discussed with the patient.  A few questions were asked by the patient and  answered.   Nelva Nay, MD 12/24/2017,9:37 AM

## 2017-12-31 ENCOUNTER — Inpatient Hospital Stay: Payer: Medicare Other

## 2017-12-31 VITALS — BP 99/65 | HR 54 | Temp 97.4°F | Resp 18

## 2017-12-31 DIAGNOSIS — D509 Iron deficiency anemia, unspecified: Secondary | ICD-10-CM | POA: Diagnosis not present

## 2017-12-31 DIAGNOSIS — D508 Other iron deficiency anemias: Secondary | ICD-10-CM

## 2017-12-31 MED ORDER — IRON SUCROSE 20 MG/ML IV SOLN
100.0000 mg | Freq: Once | INTRAVENOUS | Status: AC
Start: 2017-12-31 — End: 2017-12-31
  Administered 2017-12-31: 100 mg via INTRAVENOUS

## 2017-12-31 MED ORDER — SODIUM CHLORIDE 0.9 % IV SOLN
Freq: Once | INTRAVENOUS | Status: AC
  Administered 2017-12-31: 09:00:00 via INTRAVENOUS
  Filled 2017-12-31: qty 1000

## 2017-12-31 NOTE — Patient Instructions (Signed)
Iron Sucrose injection What is this medicine? IRON SUCROSE (AHY ern SOO krohs) is an iron complex. Iron is used to make healthy red blood cells, which carry oxygen and nutrients throughout the body. This medicine is used to treat iron deficiency anemia in people with chronic kidney disease. This medicine may be used for other purposes; ask your health care provider or pharmacist if you have questions. COMMON BRAND NAME(S): Venofer What should I tell my health care provider before I take this medicine? They need to know if you have any of these conditions: -anemia not caused by low iron levels -heart disease -high levels of iron in the blood -kidney disease -liver disease -an unusual or allergic reaction to iron, other medicines, foods, dyes, or preservatives -pregnant or trying to get pregnant -breast-feeding How should I use this medicine? This medicine is for infusion into a vein. It is given by a health care professional in a hospital or clinic setting. Talk to your pediatrician regarding the use of this medicine in children. While this drug may be prescribed for children as young as 2 years for selected conditions, precautions do apply. Overdosage: If you think you have taken too much of this medicine contact a poison control center or emergency room at once. NOTE: This medicine is only for you. Do not share this medicine with others. What if I miss a dose? It is important not to miss your dose. Call your doctor or health care professional if you are unable to keep an appointment. What may interact with this medicine? Do not take this medicine with any of the following medications: -deferoxamine -dimercaprol -other iron products This medicine may also interact with the following medications: -chloramphenicol -deferasirox This list may not describe all possible interactions. Give your health care provider a list of all the medicines, herbs, non-prescription drugs, or dietary  supplements you use. Also tell them if you smoke, drink alcohol, or use illegal drugs. Some items may interact with your medicine. What should I watch for while using this medicine? Visit your doctor or healthcare professional regularly. Tell your doctor or healthcare professional if your symptoms do not start to get better or if they get worse. You may need blood work done while you are taking this medicine. You may need to follow a special diet. Talk to your doctor. Foods that contain iron include: whole grains/cereals, dried fruits, beans, or peas, leafy green vegetables, and organ meats (liver, kidney). What side effects may I notice from receiving this medicine? Side effects that you should report to your doctor or health care professional as soon as possible: -allergic reactions like skin rash, itching or hives, swelling of the face, lips, or tongue -breathing problems -changes in blood pressure -cough -fast, irregular heartbeat -feeling faint or lightheaded, falls -fever or chills -flushing, sweating, or hot feelings -joint or muscle aches/pains -seizures -swelling of the ankles or feet -unusually weak or tired Side effects that usually do not require medical attention (report to your doctor or health care professional if they continue or are bothersome): -diarrhea -feeling achy -headache -irritation at site where injected -nausea, vomiting -stomach upset -tiredness This list may not describe all possible side effects. Call your doctor for medical advice about side effects. You may report side effects to FDA at 1-800-FDA-1088. Where should I keep my medicine? This drug is given in a hospital or clinic and will not be stored at home. NOTE: This sheet is a summary. It may not cover all possible information. If   you have questions about this medicine, talk to your doctor, pharmacist, or health care provider.  2018 Elsevier/Gold Standard (2011-05-16 17:14:35)  

## 2018-01-07 ENCOUNTER — Inpatient Hospital Stay: Payer: Medicare Other

## 2018-01-15 ENCOUNTER — Inpatient Hospital Stay: Payer: Medicare Other

## 2018-01-15 VITALS — BP 105/68 | HR 62 | Temp 96.7°F | Resp 18

## 2018-01-15 DIAGNOSIS — D508 Other iron deficiency anemias: Secondary | ICD-10-CM

## 2018-01-15 DIAGNOSIS — D509 Iron deficiency anemia, unspecified: Secondary | ICD-10-CM | POA: Diagnosis not present

## 2018-01-15 MED ORDER — IRON SUCROSE 20 MG/ML IV SOLN
100.0000 mg | Freq: Once | INTRAVENOUS | Status: AC
  Administered 2018-01-15: 100 mg via INTRAVENOUS
  Filled 2018-01-15: qty 5

## 2018-01-15 MED ORDER — SODIUM CHLORIDE 0.9 % IV SOLN
Freq: Once | INTRAVENOUS | Status: AC
  Administered 2018-01-15: 10:00:00 via INTRAVENOUS
  Filled 2018-01-15: qty 1000

## 2018-01-15 NOTE — Patient Instructions (Signed)
Iron Sucrose injection What is this medicine? IRON SUCROSE (AHY ern SOO krohs) is an iron complex. Iron is used to make healthy red blood cells, which carry oxygen and nutrients throughout the body. This medicine is used to treat iron deficiency anemia in people with chronic kidney disease. This medicine may be used for other purposes; ask your health care provider or pharmacist if you have questions. COMMON BRAND NAME(S): Venofer What should I tell my health care provider before I take this medicine? They need to know if you have any of these conditions: -anemia not caused by low iron levels -heart disease -high levels of iron in the blood -kidney disease -liver disease -an unusual or allergic reaction to iron, other medicines, foods, dyes, or preservatives -pregnant or trying to get pregnant -breast-feeding How should I use this medicine? This medicine is for infusion into a vein. It is given by a health care professional in a hospital or clinic setting. Talk to your pediatrician regarding the use of this medicine in children. While this drug may be prescribed for children as young as 2 years for selected conditions, precautions do apply. Overdosage: If you think you have taken too much of this medicine contact a poison control center or emergency room at once. NOTE: This medicine is only for you. Do not share this medicine with others. What if I miss a dose? It is important not to miss your dose. Call your doctor or health care professional if you are unable to keep an appointment. What may interact with this medicine? Do not take this medicine with any of the following medications: -deferoxamine -dimercaprol -other iron products This medicine may also interact with the following medications: -chloramphenicol -deferasirox This list may not describe all possible interactions. Give your health care provider a list of all the medicines, herbs, non-prescription drugs, or dietary  supplements you use. Also tell them if you smoke, drink alcohol, or use illegal drugs. Some items may interact with your medicine. What should I watch for while using this medicine? Visit your doctor or healthcare professional regularly. Tell your doctor or healthcare professional if your symptoms do not start to get better or if they get worse. You may need blood work done while you are taking this medicine. You may need to follow a special diet. Talk to your doctor. Foods that contain iron include: whole grains/cereals, dried fruits, beans, or peas, leafy green vegetables, and organ meats (liver, kidney). What side effects may I notice from receiving this medicine? Side effects that you should report to your doctor or health care professional as soon as possible: -allergic reactions like skin rash, itching or hives, swelling of the face, lips, or tongue -breathing problems -changes in blood pressure -cough -fast, irregular heartbeat -feeling faint or lightheaded, falls -fever or chills -flushing, sweating, or hot feelings -joint or muscle aches/pains -seizures -swelling of the ankles or feet -unusually weak or tired Side effects that usually do not require medical attention (report to your doctor or health care professional if they continue or are bothersome): -diarrhea -feeling achy -headache -irritation at site where injected -nausea, vomiting -stomach upset -tiredness This list may not describe all possible side effects. Call your doctor for medical advice about side effects. You may report side effects to FDA at 1-800-FDA-1088. Where should I keep my medicine? This drug is given in a hospital or clinic and will not be stored at home. NOTE: This sheet is a summary. It may not cover all possible information. If   you have questions about this medicine, talk to your doctor, pharmacist, or health care provider.  2018 Elsevier/Gold Standard (2011-05-16 17:14:35)  

## 2018-03-25 ENCOUNTER — Other Ambulatory Visit: Payer: Medicare Other

## 2018-03-26 ENCOUNTER — Inpatient Hospital Stay: Payer: Medicare Other | Attending: Hematology and Oncology

## 2018-03-26 DIAGNOSIS — D508 Other iron deficiency anemias: Secondary | ICD-10-CM

## 2018-03-26 DIAGNOSIS — D509 Iron deficiency anemia, unspecified: Secondary | ICD-10-CM | POA: Diagnosis not present

## 2018-03-26 LAB — CBC WITH DIFFERENTIAL/PLATELET
Basophils Absolute: 0 10*3/uL (ref 0–0.1)
Basophils Relative: 0 %
Eosinophils Absolute: 0.1 10*3/uL (ref 0–0.7)
Eosinophils Relative: 2 %
HCT: 40.1 % (ref 35.0–47.0)
Hemoglobin: 13.4 g/dL (ref 12.0–16.0)
Lymphocytes Relative: 28 %
Lymphs Abs: 1.6 10*3/uL (ref 1.0–3.6)
MCH: 31.1 pg (ref 26.0–34.0)
MCHC: 33.3 g/dL (ref 32.0–36.0)
MCV: 93.2 fL (ref 80.0–100.0)
Monocytes Absolute: 0.4 10*3/uL (ref 0.2–0.9)
Monocytes Relative: 7 %
Neutro Abs: 3.7 10*3/uL (ref 1.4–6.5)
Neutrophils Relative %: 63 %
Platelets: 208 10*3/uL (ref 150–440)
RBC: 4.3 MIL/uL (ref 3.80–5.20)
RDW: 12.8 % (ref 11.5–14.5)
WBC: 5.9 10*3/uL (ref 3.6–11.0)

## 2018-03-26 LAB — FERRITIN: Ferritin: 68 ng/mL (ref 11–307)

## 2018-03-27 LAB — ANA W/REFLEX: Anti Nuclear Antibody(ANA): NEGATIVE

## 2018-06-18 ENCOUNTER — Other Ambulatory Visit: Payer: Self-pay | Admitting: *Deleted

## 2018-06-18 DIAGNOSIS — D508 Other iron deficiency anemias: Secondary | ICD-10-CM

## 2018-06-23 ENCOUNTER — Inpatient Hospital Stay: Payer: Medicare Other | Attending: Hematology and Oncology

## 2018-06-23 DIAGNOSIS — Z79899 Other long term (current) drug therapy: Secondary | ICD-10-CM | POA: Insufficient documentation

## 2018-06-23 DIAGNOSIS — D508 Other iron deficiency anemias: Secondary | ICD-10-CM

## 2018-06-23 DIAGNOSIS — D509 Iron deficiency anemia, unspecified: Secondary | ICD-10-CM | POA: Diagnosis present

## 2018-06-23 LAB — CBC WITH DIFFERENTIAL/PLATELET
Abs Immature Granulocytes: 0.01 10*3/uL (ref 0.00–0.07)
Basophils Absolute: 0.1 10*3/uL (ref 0.0–0.1)
Basophils Relative: 1 %
Eosinophils Absolute: 0.2 10*3/uL (ref 0.0–0.5)
Eosinophils Relative: 5 %
HCT: 38.2 % (ref 36.0–46.0)
Hemoglobin: 12.5 g/dL (ref 12.0–15.0)
Immature Granulocytes: 0 %
Lymphocytes Relative: 33 %
Lymphs Abs: 1.6 10*3/uL (ref 0.7–4.0)
MCH: 31.3 pg (ref 26.0–34.0)
MCHC: 32.7 g/dL (ref 30.0–36.0)
MCV: 95.7 fL (ref 80.0–100.0)
Monocytes Absolute: 0.5 10*3/uL (ref 0.1–1.0)
Monocytes Relative: 11 %
Neutro Abs: 2.5 10*3/uL (ref 1.7–7.7)
Neutrophils Relative %: 50 %
Platelets: 181 10*3/uL (ref 150–400)
RBC: 3.99 MIL/uL (ref 3.87–5.11)
RDW: 12.4 % (ref 11.5–15.5)
WBC: 4.9 10*3/uL (ref 4.0–10.5)
nRBC: 0 % (ref 0.0–0.2)

## 2018-06-23 LAB — FERRITIN: Ferritin: 42 ng/mL (ref 11–307)

## 2018-06-24 ENCOUNTER — Encounter: Payer: Self-pay | Admitting: Hematology and Oncology

## 2018-06-24 ENCOUNTER — Inpatient Hospital Stay: Payer: Medicare Other

## 2018-06-24 ENCOUNTER — Inpatient Hospital Stay (HOSPITAL_BASED_OUTPATIENT_CLINIC_OR_DEPARTMENT_OTHER): Payer: Medicare Other | Admitting: Hematology and Oncology

## 2018-06-24 VITALS — BP 107/71 | HR 63 | Resp 18

## 2018-06-24 VITALS — BP 106/76 | HR 65 | Temp 96.9°F | Resp 18 | Wt 134.0 lb

## 2018-06-24 DIAGNOSIS — Z79899 Other long term (current) drug therapy: Secondary | ICD-10-CM

## 2018-06-24 DIAGNOSIS — D508 Other iron deficiency anemias: Secondary | ICD-10-CM

## 2018-06-24 DIAGNOSIS — D509 Iron deficiency anemia, unspecified: Secondary | ICD-10-CM

## 2018-06-24 LAB — PREGNANCY, URINE: Preg Test, Ur: NEGATIVE

## 2018-06-24 MED ORDER — SODIUM CHLORIDE 0.9 % IV SOLN
Freq: Once | INTRAVENOUS | Status: AC
  Administered 2018-06-24: 10:00:00 via INTRAVENOUS
  Filled 2018-06-24: qty 250

## 2018-06-24 MED ORDER — IRON SUCROSE 20 MG/ML IV SOLN
100.0000 mg | Freq: Once | INTRAVENOUS | Status: AC
  Administered 2018-06-24: 100 mg via INTRAVENOUS

## 2018-06-24 MED ORDER — SODIUM CHLORIDE 0.9 % IV SOLN: 100.0000 mg | Freq: Once | INTRAVENOUS | Status: DC

## 2018-06-24 NOTE — Progress Notes (Signed)
Patient continues to have constipation and nausea.  Otherwise, offers no complaints.

## 2018-06-24 NOTE — Progress Notes (Signed)
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  HPI: The patient was last seen in the medical oncology clinic on 12/24/2017.  At that time,  she was fatigued. Patient was becoming more symptomatic. She had ice pica.  Exam was unremarkable.  Ferritin was 32.  She received Venofer weekly x 3 (12/24/2017 - 01/15/2018).  CBC on 03/26/2018:  Hematocrit 40.1, hemoglobin 13.4, and MCV 93.2.  Ferritin was 68.  ANA was negative. CBC on 06/23/2018:  Hematocrit 38.2, hemoglobin 12.5, and MCV 95.7.  Ferritin was 42.  During the interim, patient notes that things have been going "pretty good". She notes that she felt better following her last iron infusions. Patient notes that she is "starting to get a little more tired". No exertional dyspnea. She denies ice pica and restless legs at this point. Patient denies bleeding; no hematochezia, melena, or gross hematuria. She has intermittent issues with constipation and nausea.   Patient advises that she maintains an adequate appetite. She is eating well. Weight today is 134 lb 0.6 oz (60.8 kg).  Patient denies pain in the clinic today.   Past Medical History:  Diagnosis Date  . Acid reflux   . Anemia   . Anxiety   . Asthma   . Chronic constipation   . Hirschsprung disease   . Pelvic floor dysfunction   . Syncope, cardiogenic     Past Surgical History:  Procedure Laterality Date  . abdomial adhesions removed  2012  . APPENDECTOMY  1997  . bladder repair surgery  1997  . c- sections  [id number]  . enodmetiosis removed  2012  . OVARIAN CYST REMOVAL  2003, 2012  . TONSILLECTOMY  2007    Family History  Problem Relation Age of Onset  . Cancer Mother   . Diabetes Mother   . Hypertension Mother   . Cancer Father   . Cancer Maternal Grandfather   . Factor IX deficiency  Maternal Grandfather   . Factor IX deficiency Son   . Kidney cancer Neg Hx   . Bladder Cancer Neg Hx     Social History:  reports that she has never smoked. She has never used smokeless tobacco. She reports that she does not drink alcohol or use drugs.  The patient is alone today.  Allergies:  Allergies  Allergen Reactions  . Meperidine Anaphylaxis    Other reaction(s): Other (See Comments) Other Reaction: Not Assessed  . Lactose     Other reaction(s): Other (See Comments) Other Reaction: GI Upset  . Fludrocortisone Hives  . Levofloxacin Hives  . Sulfamethoxazole-Trimethoprim Other (See Comments)    Other reaction(s): Dizziness    Current Medications: Current Outpatient Medications  Medication Sig Dispense Refill  . albuterol (PROAIR HFA) 108 (90 Base) MCG/ACT inhaler Inhale 1 puff into the lungs every 6 (six) hours as needed for wheezing or shortness of breath.     . EPINEPHrine (EPIPEN 2-PAK) 0.3 mg/0.3 mL IJ SOAJ injection Inject 0.3 mg into the muscle as needed. Reported on 02/07/2016    . hyoscyamine (LEVSIN/SL) 0.125 MG SL tablet Take 0.125 mg by mouth every 6 (six) hours as needed for cramping.     . midodrine (PROAMATINE) 10 MG tablet Take 10 mg by mouth daily.     . norethindrone-ethinyl estradiol (JUNEL 1/20) 1-20 MG-MCG tablet Take 1 tablet  by mouth daily.    Marland Kitchen omeprazole (PRILOSEC) 40 MG capsule 1 capsule by mouth daily  0  . ondansetron (ZOFRAN) 4 MG tablet Take 4 mg by mouth every 8 (eight) hours as needed for nausea.     . Plecanatide 3 MG TABS Take 3 mg by mouth daily.     . Probiotic Product (CULTURELLE IMMUNE DEFENSE PO) Take by mouth.    . prochlorperazine (COMPAZINE) 10 MG tablet Take 10 mg by mouth every 8 (eight) hours as needed for nausea or vomiting.     . promethazine (PHENERGAN) 25 MG tablet Take 25 mg by mouth every 8 (eight) hours as needed for vomiting.     . sertraline (ZOLOFT) 100 MG tablet Take 100 mg by mouth daily.    . Sodium Phosphates (RA  ENEMA) 7-19 GM/118ML ENEM Place rectally as needed (constipation).     . sertraline (ZOLOFT) 100 MG tablet Take 100 mg by mouth daily.      No current facility-administered medications for this visit.     Review of Systems  Constitutional: Positive for malaise/fatigue (slight). Negative for chills, diaphoresis, fever and weight loss.       Starting to get tired.  HENT: Negative.   Eyes: Negative.   Respiratory: Negative for cough, hemoptysis, sputum production and shortness of breath.   Cardiovascular: Negative for chest pain, palpitations, orthopnea, leg swelling and PND.  Gastrointestinal: Positive for constipation and nausea. Negative for abdominal pain, blood in stool, diarrhea, melena and vomiting.       (+) ice pica  Genitourinary: Negative for dysuria, frequency, hematuria and urgency.  Musculoskeletal: Negative for back pain, falls, joint pain and myalgias.  Skin: Negative.  Negative for itching and rash.  Neurological: Negative for dizziness, tremors, weakness and headaches.  Endo/Heme/Allergies: Bruises/bleeds easily.  Psychiatric/Behavioral: Negative for depression, memory loss and suicidal ideas. The patient is not nervous/anxious and does not have insomnia.   All other systems reviewed and are negative.  Performance status (ECOG): 1 - Symptomatic but completely ambulatory  Vital Signs BP 106/76 (BP Location: Left Arm, Patient Position: Sitting)   Pulse 65   Temp (!) 96.9 F (36.1 C)   Resp 18   Wt 134 lb 0.6 oz (60.8 kg)   BMI 22.31 kg/m   Physical Exam  Constitutional: She is oriented to person, place, and time and well-developed, well-nourished, and in no distress. No distress.  HENT:  Head: Normocephalic and atraumatic.  Mouth/Throat: Oropharynx is clear and moist and mucous membranes are normal.  Shoulder length brown hair.  Eyes: Pupils are equal, round, and reactive to light. Conjunctivae and EOM are normal. No scleral icterus.  Neck: Normal range of  motion. Neck supple. No JVD present.  Cardiovascular: Normal rate, regular rhythm, normal heart sounds and intact distal pulses. Exam reveals no gallop and no friction rub.  No murmur heard. Pulmonary/Chest: Effort normal and breath sounds normal. No respiratory distress. She has no wheezes. She has no rales.  Abdominal: Soft. Bowel sounds are normal. She exhibits no distension and no mass. There is no abdominal tenderness. There is no rebound and no guarding.  Musculoskeletal: Normal range of motion. She exhibits no edema or tenderness.  Lymphadenopathy:    She has no cervical adenopathy.  Neurological: She is alert and oriented to person, place, and time.  Skin: Skin is warm and dry. No rash noted. She is not diaphoretic. No erythema.  Psychiatric: Mood, affect and judgment normal.  Nursing note and vitals reviewed.  Appointment on 06/24/2018  Component Date Value Ref Range Status  . Preg Test, Ur 06/24/2018 NEGATIVE  NEGATIVE Final   Performed at [facility], [address], [place], [place] [address]  Appointment on 06/23/2018  Component Date Value Ref Range Status  . Ferritin 06/23/2018 42  11 - 307 ng/mL Final   Performed at [facility], [address] [place], [place] [address]  . WBC 06/23/2018 4.9  4.0 - 10.5 K/uL Final  . RBC 06/23/2018 3.99  3.87 - 5.11 MIL/uL Final  . Hemoglobin 06/23/2018 12.5  12.0 - 15.0 g/dL Final  . HCT 13/03/6577 38.2  36.0 - 46.0 % Final  . MCV 06/23/2018 95.7  80.0 - 100.0 fL Final  . MCH 06/23/2018 31.3  26.0 - 34.0 pg Final  . MCHC 06/23/2018 32.7  30.0 - 36.0 g/dL Final  . RDW 46/96/2952 12.4  11.5 - 15.5 % Final  . Platelets 06/23/2018 181  150 - 400 K/uL Final  . nRBC 06/23/2018 0.0  0.0 - 0.2 % Final  . Neutrophils Relative % 06/23/2018 50  % Final  . Neutro Abs 06/23/2018 2.5  1.7 - 7.7 K/uL Final  . Lymphocytes Relative 06/23/2018 33  % Final  . Lymphs Abs 06/23/2018 1.6  0.7 - 4.0 K/uL Final  . Monocytes  Relative 06/23/2018 11  % Final  . Monocytes Absolute 06/23/2018 0.5  0.1 - 1.0 K/uL Final  . Eosinophils Relative 06/23/2018 5  % Final  . Eosinophils Absolute 06/23/2018 0.2  0.0 - 0.5 K/uL Final  . Basophils Relative 06/23/2018 1  % Final  . Basophils Absolute 06/23/2018 0.1  0.0 - 0.1 K/uL Final  . Immature Granulocytes 06/23/2018 0  % Final  . Abs Immature Granulocytes 06/23/2018 0.01  0.00 - 0.07 K/uL Final   Performed at [facility], [address], [place], [place] [address]    Assessment:  KENYIA WAMBOLT is a 32 y.o. female with chronic constipation and iron deficiency anemia.  Ferritin was 3 on 10/04/2014 consistent with iron deficiency.  She denies any melena or hematochezia.  She has a history of heavy menses felt secondary to endometriosis.  Menses is now light on BCP.  EGD was normal in 08/2016.  Colonoscopy was normal in 09/2015.  She has a history of microscopic hematuria.  CT hematuria work-up on 04/29/2017 was negative.  She is followed by urology.  Work-up on 03/08/2015 confirmed iron deficiency (ferritin 5). B12 and folate were normal.   She has received Venofer 600 mg (03/15/2015 - 04/19/2015), x2 (08/09/2015 - 08/16/2015), x1 (10/31/2015), x2 (05/08/2016 - 05/15/2016), x2 (03/05/2017 - 03/12/2017), x2 (06/04/2017 - 06/11/2017), and  x3 (12/24/2017 - 01/15/2018). She receives Venofer if ferritin is < 50 with symptoms.  Ferritin has been followed: 5 on 03/08/2015, 28 on 03/29/2015, 64 on 04/26/2015, 9 on 07/26/2015, 40 on 08/23/2015, 35 on 09/20/2015 29 on 10/25/2015, 32 on 02/07/2016, 36 on 05/08/2016, 39 on 02/17/2017, 39 on 05/27/2017, 42 on 09/03/2017, 32 on 12/23/2017, 68 on 03/26/2018, and 42 on 06/23/2018.  She has a son with factor IX deficiency.  Another son is being evaluated for von Willebrand's disease.  Work-up on 03/08/2015 revealed a normal platelet count, PT, PTT, von Willebrand panel, and multimers.  She has a history of microscopic  hematuria.  CT hematuria workup on 04/29/2017 revealed no findings to account for the patient's microscopic hematuria. She is followed by urology. Repeat urinalysis revealed no microscopic hematuria.  Symptomatically, she is  starting to become fatigued again.  She has intermittent issues with constipation and nausea.  Patient has ice pica.  She denies exertional dyspnea and restless legs.  Exam is grossly unremarkable.  Plan: 1. Review labs from 06/23/2018.  Hematocrit and MCV are normal.  2. Iron deficiency  Labs reviewed.  No anemia or microcytosis.  Iron stores have decreased; ferritin 42 ng/mL.  Patient becoming increasingly symptomatic.  Will treat with Venofer 200 mg today, then again next week.  Obtain urine HCG prior to infusion. 3. RTC in 3 months for labs (CBC with diff, ferritin). 4. RTC in 6 months for MD assessment, labs (CBC with diff, ferritin - the day before), +/- Venofer   Quentin Mulling, NP  06/24/2018, 9:37 AM   I saw and evaluated the patient, participating in the key portions of the service and reviewing pertinent diagnostic studies and records.  I reviewed the nurse practitioner's note and agree with the findings and the plan.  The assessment and plan were discussed with the patient.  A few questions were asked by the patient and answered.   Nelva Nay, MD 06/24/2018,9:37 AM

## 2018-06-24 NOTE — Patient Instructions (Addendum)
Iron Sucrose injection What is this medicine? IRON SUCROSE (AHY ern SOO krohs) is an iron complex. Iron is used to make healthy red blood cells, which carry oxygen and nutrients throughout the body. This medicine is used to treat iron deficiency anemia in people with chronic kidney disease. This medicine may be used for other purposes; ask your health care provider or pharmacist if you have questions. COMMON BRAND NAME(S): Venofer What should I tell my health care provider before I take this medicine? They need to know if you have any of these conditions: -anemia not caused by low iron levels -heart disease -high levels of iron in the blood -kidney disease -liver disease -an unusual or allergic reaction to iron, other medicines, foods, dyes, or preservatives -pregnant or trying to get pregnant -breast-feeding How should I use this medicine? This medicine is for infusion into a vein. It is given by a health care professional in a hospital or clinic setting. Talk to your pediatrician regarding the use of this medicine in children. While this drug may be prescribed for children as young as 2 years for selected conditions, precautions do apply. Overdosage: If you think you have taken too much of this medicine contact a poison control center or emergency room at once. NOTE: This medicine is only for you. Do not share this medicine with others. What if I miss a dose? It is important not to miss your dose. Call your doctor or health care professional if you are unable to keep an appointment. What may interact with this medicine? Do not take this medicine with any of the following medications: -deferoxamine -dimercaprol -other iron products This medicine may also interact with the following medications: -chloramphenicol -deferasirox This list may not describe all possible interactions. Give your health care provider a list of all the medicines, herbs, non-prescription drugs, or dietary  supplements you use. Also tell them if you smoke, drink alcohol, or use illegal drugs. Some items may interact with your medicine. What should I watch for while using this medicine? Visit your doctor or healthcare professional regularly. Tell your doctor or healthcare professional if your symptoms do not start to get better or if they get worse. You may need blood work done while you are taking this medicine. You may need to follow a special diet. Talk to your doctor. Foods that contain iron include: whole grains/cereals, dried fruits, beans, or peas, leafy green vegetables, and organ meats (liver, kidney). What side effects may I notice from receiving this medicine? Side effects that you should report to your doctor or health care professional as soon as possible: -allergic reactions like skin rash, itching or hives, swelling of the face, lips, or tongue -breathing problems -changes in blood pressure -cough -fast, irregular heartbeat -feeling faint or lightheaded, falls -fever or chills -flushing, sweating, or hot feelings -joint or muscle aches/pains -seizures -swelling of the ankles or feet -unusually weak or tired Side effects that usually do not require medical attention (report to your doctor or health care professional if they continue or are bothersome): -diarrhea -feeling achy -headache -irritation at site where injected -nausea, vomiting -stomach upset -tiredness This list may not describe all possible side effects. Call your doctor for medical advice about side effects. You may report side effects to FDA at 1-800-FDA-1088. Where should I keep my medicine? This drug is given in a hospital or clinic and will not be stored at home. NOTE: This sheet is a summary. It may not cover all possible information. If   you have questions about this medicine, talk to your doctor, pharmacist, or health care provider.  2018 Elsevier/Gold Standard (2011-05-16 17:14:35)  

## 2018-07-01 ENCOUNTER — Inpatient Hospital Stay: Payer: Medicare Other

## 2018-07-01 VITALS — BP 105/71 | HR 58 | Temp 95.1°F | Resp 18

## 2018-07-01 DIAGNOSIS — D508 Other iron deficiency anemias: Secondary | ICD-10-CM

## 2018-07-01 DIAGNOSIS — D509 Iron deficiency anemia, unspecified: Secondary | ICD-10-CM | POA: Diagnosis not present

## 2018-07-01 MED ORDER — IRON SUCROSE 20 MG/ML IV SOLN
100.0000 mg | Freq: Once | INTRAVENOUS | Status: AC
  Administered 2018-07-01: 100 mg via INTRAVENOUS

## 2018-07-01 MED ORDER — SODIUM CHLORIDE 0.9 % IV SOLN
Freq: Once | INTRAVENOUS | Status: AC
  Administered 2018-07-01: 09:00:00 via INTRAVENOUS
  Filled 2018-07-01: qty 250

## 2018-07-01 MED ORDER — SODIUM CHLORIDE 0.9 % IV SOLN: 100.0000 mg | Freq: Once | INTRAVENOUS | Status: DC

## 2018-09-24 ENCOUNTER — Inpatient Hospital Stay: Payer: Medicare Other | Attending: Hematology and Oncology

## 2018-09-24 DIAGNOSIS — D509 Iron deficiency anemia, unspecified: Secondary | ICD-10-CM | POA: Insufficient documentation

## 2018-09-24 DIAGNOSIS — D508 Other iron deficiency anemias: Secondary | ICD-10-CM

## 2018-09-24 DIAGNOSIS — Z79899 Other long term (current) drug therapy: Secondary | ICD-10-CM | POA: Diagnosis not present

## 2018-09-24 LAB — CBC WITH DIFFERENTIAL/PLATELET
Abs Immature Granulocytes: 0.01 10*3/uL (ref 0.00–0.07)
Basophils Absolute: 0 10*3/uL (ref 0.0–0.1)
Basophils Relative: 1 %
Eosinophils Absolute: 0.1 10*3/uL (ref 0.0–0.5)
Eosinophils Relative: 2 %
HCT: 37.8 % (ref 36.0–46.0)
Hemoglobin: 12.7 g/dL (ref 12.0–15.0)
Immature Granulocytes: 0 %
Lymphocytes Relative: 29 %
Lymphs Abs: 1.4 10*3/uL (ref 0.7–4.0)
MCH: 32 pg (ref 26.0–34.0)
MCHC: 33.6 g/dL (ref 30.0–36.0)
MCV: 95.2 fL (ref 80.0–100.0)
Monocytes Absolute: 0.5 10*3/uL (ref 0.1–1.0)
Monocytes Relative: 10 %
Neutro Abs: 2.8 10*3/uL (ref 1.7–7.7)
Neutrophils Relative %: 58 %
Platelets: 199 10*3/uL (ref 150–400)
RBC: 3.97 MIL/uL (ref 3.87–5.11)
RDW: 12.1 % (ref 11.5–15.5)
WBC: 4.8 10*3/uL (ref 4.0–10.5)
nRBC: 0 % (ref 0.0–0.2)

## 2018-09-24 LAB — FERRITIN: Ferritin: 102 ng/mL (ref 11–307)

## 2018-09-25 ENCOUNTER — Telehealth: Payer: Self-pay

## 2018-09-25 NOTE — Telephone Encounter (Signed)
-----   Message from Rosey Bath, MD sent at 09/25/2018  9:31 AM EST ----- Regarding: Please call patient  Iron stores look good.  M ----- Message ----- From: Leory Plowman, Lab In [place] Sent: 09/24/2018   9:05 AM EST To: Rosey Bath, MD

## 2018-09-25 NOTE — Telephone Encounter (Signed)
Contacted patient regarding Iron levels. Advised Iron levels are improving. Pt states understanding and denies any concerns at this time.

## 2018-12-21 ENCOUNTER — Other Ambulatory Visit: Payer: Self-pay

## 2018-12-22 ENCOUNTER — Other Ambulatory Visit: Payer: Self-pay

## 2018-12-22 ENCOUNTER — Inpatient Hospital Stay: Payer: Medicare Other | Attending: Hematology and Oncology

## 2018-12-22 DIAGNOSIS — D509 Iron deficiency anemia, unspecified: Secondary | ICD-10-CM | POA: Diagnosis present

## 2018-12-22 DIAGNOSIS — D508 Other iron deficiency anemias: Secondary | ICD-10-CM

## 2018-12-22 LAB — CBC WITH DIFFERENTIAL/PLATELET
Abs Immature Granulocytes: 0.01 10*3/uL (ref 0.00–0.07)
Basophils Absolute: 0 10*3/uL (ref 0.0–0.1)
Basophils Relative: 1 %
Eosinophils Absolute: 0.1 10*3/uL (ref 0.0–0.5)
Eosinophils Relative: 3 %
HCT: 38.9 % (ref 36.0–46.0)
Hemoglobin: 12.7 g/dL (ref 12.0–15.0)
Immature Granulocytes: 0 %
Lymphocytes Relative: 28 %
Lymphs Abs: 1.5 10*3/uL (ref 0.7–4.0)
MCH: 31.8 pg (ref 26.0–34.0)
MCHC: 32.6 g/dL (ref 30.0–36.0)
MCV: 97.3 fL (ref 80.0–100.0)
Monocytes Absolute: 0.4 10*3/uL (ref 0.1–1.0)
Monocytes Relative: 8 %
Neutro Abs: 3.1 10*3/uL (ref 1.7–7.7)
Neutrophils Relative %: 60 %
Platelets: 157 10*3/uL (ref 150–400)
RBC: 4 MIL/uL (ref 3.87–5.11)
RDW: 12.1 % (ref 11.5–15.5)
WBC: 5.2 10*3/uL (ref 4.0–10.5)
nRBC: 0 % (ref 0.0–0.2)

## 2018-12-22 LAB — FERRITIN: Ferritin: 32 ng/mL (ref 11–307)

## 2018-12-23 ENCOUNTER — Ambulatory Visit: Payer: Medicare Other | Admitting: Hematology and Oncology

## 2018-12-23 ENCOUNTER — Telehealth: Payer: Self-pay

## 2018-12-23 ENCOUNTER — Other Ambulatory Visit: Payer: Medicare Other

## 2018-12-23 ENCOUNTER — Ambulatory Visit: Payer: Medicare Other

## 2018-12-23 NOTE — Telephone Encounter (Signed)
-----   Message from Rosey Bath, MD sent at 12/23/2018 11:01 AM EDT ----- Regarding: Please call patient  Hematocrit is normal.  Ferritin 32.  She receives IV iron if ferritin < 50 and symptomatic.  Please ask about any symptoms.  M ----- Message ----- From: Leory Plowman, Lab In [place] Sent: 12/22/2018   9:07 AM EDT To: Rosey Bath, MD

## 2018-12-23 NOTE — Telephone Encounter (Signed)
Left VM requesting callback from patient regarding lab work. (This is to determine if patient is symptomatic and possibly need IV Iron)

## 2018-12-28 ENCOUNTER — Telehealth: Payer: Self-pay

## 2018-12-28 NOTE — Telephone Encounter (Signed)
Informed patient of Ferritin levels. Patient reports she is "not quite to that point yet" where she needs IV Iron. Patient states she will contact office to schedule IV Iron when she feels she needs it.

## 2018-12-28 NOTE — Telephone Encounter (Signed)
-----   Message from Melissa C Corcoran, MD sent at 12/23/2018 11:01 AM EDT ----- Regarding: Please call patient  Hematocrit is normal.  Ferritin 32.  She receives IV iron if ferritin < 50 and symptomatic.  Please ask about any symptoms.  M ----- Message ----- From: Interface, Lab In [place] Sent: 12/22/2018   9:07 AM EDT To: Melissa C Corcoran, MD   

## 2019-02-16 ENCOUNTER — Ambulatory Visit (INDEPENDENT_AMBULATORY_CARE_PROVIDER_SITE_OTHER)
Admission: RE | Admit: 2019-02-16 | Discharge: 2019-02-16 | Disposition: A | Discharge status: Home / Self Care | Payer: Medicare Other | Source: Ambulatory Visit

## 2019-02-16 DIAGNOSIS — Z8742 Personal history of other diseases of the female genital tract: Secondary | ICD-10-CM

## 2019-02-16 DIAGNOSIS — N899 Noninflammatory disorder of vagina, unspecified: Secondary | ICD-10-CM

## 2019-02-16 MED ORDER — METRONIDAZOLE 500 MG PO TABS: 500.0000 mg | ORAL_TABLET | Freq: Two times a day (BID) | ORAL | 0 refills | Status: DC

## 2019-02-16 MED ORDER — FLUCONAZOLE 200 MG PO TABS: 200.0000 mg | ORAL_TABLET | Freq: Every day | ORAL | 0 refills | Status: AC

## 2019-02-16 NOTE — Discharge Instructions (Addendum)
Take Flagyl as directed. May use Diflucan at the end of your antibiotic course if you develop vaginal itching, irritation. Return if you develop abdominal, pelvic, vaginal pain or fever.

## 2019-02-16 NOTE — ED Provider Notes (Signed)
Virtual Visit via Video Note:  [name]  initiated request for Telemedicine visit with [facility] team. I connected with Ernesto Rutherford  on 02/16/2019 at 11:38 AM  for a synchronized telemedicine visit using a video enabled HIPPA compliant telemedicine application. I verified that I am speaking with Ernesto Rutherford  using two identifiers. [place] Hall-Potvin, PA-C  was physically located in a [facility] site and OTILIA KAREEM was located at a different location.   The limitations of evaluation and management by telemedicine as well as the availability of in-person appointments were discussed. Patient was informed that she  may incur a bill ( including co-pay) for this virtual visit encounter. Lainee E Weightman  expressed understanding and gave verbal consent to proceed with virtual visit.     History of Present Illness:Latasha [name]  is a 33 y.o. female with remote history of BV presents with acute concern of BV.  Patient states that she tends to get BV flares annually, tend to be towards the beginning of summer.  Patient states that she was out by the pool all weekend, developed vaginal discharge with fishy odor 2 days ago.  Patient denies abdominal, pelvic, vaginal pain, anogenital lesions, vaginal bleeding, pain with intercourse.  She currently sexually active 1 female partner, not routinely wearing condoms.  LMP: 02/12/2019.  Past Medical History:  Diagnosis Date  . Acid reflux   . Anemia   . Anxiety   . Asthma   . Chronic constipation   . Hirschsprung disease   . Pelvic floor dysfunction   . Syncope, cardiogenic     Allergies  Allergen Reactions  . Meperidine Anaphylaxis    Other reaction(s): Other (See Comments) Other Reaction: Not Assessed  . Lactose     Other reaction(s): Other (See Comments) Other Reaction: GI Upset  . Fludrocortisone Hives  . Levofloxacin Hives  . Sulfamethoxazole-Trimethoprim Other (See Comments)    Other  reaction(s): Dizziness        Observations/Objective: 33 year old female sitting in no acute distress.  Patient is able to speak in full sentences without pausing, wheezing, coughing.  Assessment and Plan: 33 year old female with history of BV, presenting for similar symptoms.  Will treat with 1 day course of Flagyl, followed by Diflucan as patient states that she tends to get yeast infections status post antibiotic use.  Follow Up Instructions: Patient to follow-up with PCP or urgent care should symptoms not resolve, worsen.  Patient verbalized understanding of return precautions, care plan.   I discussed the assessment and treatment plan with the patient. The patient was provided an opportunity to ask questions and all were answered. The patient agreed with the plan and demonstrated an understanding of the instructions.   The patient was advised to call back or seek an in-person evaluation if the symptoms worsen or if the condition fails to improve as anticipated.  I provided 15 minutes of non-face-to-face time during this encounter.    H[place], PA-C  02/16/2019 11:38 AM        Hall-Potvin, [place], PA-C 02/16/19 1157

## 2019-02-18 ENCOUNTER — Other Ambulatory Visit: Payer: Self-pay

## 2019-02-18 DIAGNOSIS — D509 Iron deficiency anemia, unspecified: Secondary | ICD-10-CM

## 2019-02-23 ENCOUNTER — Telehealth: Payer: Self-pay | Admitting: Hematology and Oncology

## 2019-02-23 ENCOUNTER — Other Ambulatory Visit: Payer: Self-pay

## 2019-02-23 NOTE — Telephone Encounter (Signed)
Spoke with pt to confirm appt date/time, do pre-appt screen which was completed, and adv of Covid-19 guidelines for appt regarding screening questions, temperature check, face mask required, and no visitors allowed °

## 2019-02-23 NOTE — Progress Notes (Signed)
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   HPI: The patient was last seen in the medical oncology clinic on 06/24/2018. At that time, she was starting to become fatigued again. She had intermittent issues with constipation and nausea. She had ice pica. She denied exertional dyspnea and restless legs. Exam was grossly unremarkable.  Hematocrit was 38.2, hemoglobin 12.5, and MCV 95.7.  Ferritin was 42.  She received Venofer weekly x 2.   Labs followed: 09/24/2018: WBC 4,800, hemoglobin 12.7, hematocrit 37.8, platelets 199,000, MCV 95.2, ferritin 102. 12/22/2018: WBC 5,200, hemoglobin 12.7, hematocrit 38.9, platelets 157,000, MCV 97.3, ferritin 32.  During the interim, the patient is doing good. She reports feeling fatigued. She notes the ice pica is starting to come back. She notes her constipation has not changed, and bowel movements are still once a week. She denies any diarrhea, nausea, or hematuria. She denies any other symptoms.    Past Medical History:  Diagnosis Date  . Acid reflux   . Anemia   . Anxiety   . Asthma   . Chronic constipation   . Hirschsprung disease   . Pelvic floor dysfunction   . Syncope, cardiogenic     Past Surgical History:  Procedure Laterality Date  . abdomial adhesions removed  2012  . APPENDECTOMY  1997  . bladder repair surgery  1997  . Latasha [name]- sections  [id number]  . enodmetiosis removed  2012  . OVARIAN CYST REMOVAL  2003, 2012  . TONSILLECTOMY  2007    Family History  Problem Relation Age of Onset  . Cancer Mother   . Diabetes Mother   . Hypertension Mother   . Cancer Father   . Cancer Maternal Grandfather   . Factor IX deficiency Maternal  Grandfather   . Factor IX deficiency Son   . Kidney cancer Neg Hx   . Bladder Cancer Neg Hx     Social History:  reports that she has never smoked. She has never used smokeless tobacco. She reports that she does not drink alcohol or use drugs.  She lives in [place].  The patient is alone today.  Allergies:  Allergies  Allergen Reactions  . Meperidine Anaphylaxis    Other reaction(s): Other (See Comments) Other Reaction: Not Assessed  . Lactose     Other reaction(s): Other (See Comments) Other Reaction: GI Upset  . Fludrocortisone Hives  . Levofloxacin Hives  . Sulfamethoxazole-Trimethoprim Other (See Comments)    Other reaction(s): Dizziness    Current Medications: Current Outpatient Medications  Medication Sig Dispense Refill  . Acidophilus Lactobacillus CAPS Take 1 tablet by mouth daily.     Marland Kitchen albuterol (PROAIR HFA) 108 (90 Base) MCG/ACT inhaler Inhale 1 puff into the lungs every 6 (six) hours as needed for wheezing or shortness of breath.     . EPINEPHrine (EPIPEN 2-PAK) 0.3 mg/0.3 mL IJ SOAJ injection Inject 0.3 mg into the muscle as needed. Reported on 02/07/2016    . fluticasone (FLONASE) 50 MCG/ACT nasal spray SHAKE LQ AND U 2 SPRAYS IEN QD    . hydrOXYzine (ATARAX/VISTARIL) 25 MG tablet TK 1 T PO QD PRN    . midodrine (PROAMATINE) 10 MG tablet Take 10 mg by mouth  daily.     . norethindrone-ethinyl estradiol (JUNEL 1/20) 1-20 MG-MCG tablet Take 1 tablet by mouth daily.    Marland Kitchen. omeprazole (PRILOSEC) 40 MG capsule 1 capsule by mouth daily  0  . ondansetron (ZOFRAN) 4 MG tablet Take 4 mg by mouth every 8 (eight) hours as needed for nausea.     . Plecanatide 3 MG TABS Take 3 mg by mouth daily.     . prochlorperazine (COMPAZINE) 10 MG tablet Take 10 mg by mouth every 8 (eight) hours as needed for nausea or vomiting.     . promethazine (PHENERGAN) 25 MG tablet Take 25 mg by mouth every 8 (eight) hours as needed for vomiting.     . Sodium Phosphates (RA ENEMA) 7-19 GM/118ML ENEM  Place rectally as needed (constipation).     . sertraline (ZOLOFT) 100 MG tablet Take 200 mg by mouth daily.      No current facility-administered medications for this visit.     Review of Systems  Constitutional: Positive for malaise/fatigue (slight). Negative for chills, diaphoresis, fever and weight loss.       Doing good.  HENT: Negative.  Negative for congestion, ear pain, nosebleeds, sinus pain and sore throat.   Eyes: Negative.  Negative for blurred vision, double vision and pain.  Respiratory: Negative.  Negative for cough, hemoptysis, sputum production and shortness of breath.   Cardiovascular: Negative.  Negative for chest pain, palpitations, orthopnea, leg swelling and PND.  Gastrointestinal: Positive for constipation. Negative for abdominal pain, blood in stool, diarrhea, melena, nausea and vomiting.       (+) ice pica  Genitourinary: Negative.  Negative for dysuria, frequency, hematuria and urgency.  Musculoskeletal: Negative.  Negative for back pain, falls, joint pain and myalgias.  Skin: Negative.  Negative for itching and rash.  Neurological: Negative.  Negative for dizziness, tremors, speech change, focal weakness, weakness and headaches.  Endo/Heme/Allergies: Bruises/bleeds easily.  Psychiatric/Behavioral: Negative for depression and memory loss. The patient is not nervous/anxious and does not have insomnia.   All other systems reviewed and are negative.  Performance status (ECOG): 1  Vital Signs Blood pressure 114/75, pulse 64, temperature 98.5 F (36.9 Latasha [name]), temperature source Tympanic, resp. rate 16, weight 138 lb 3.7 oz (62.7 kg), SpO2 100 %.  Physical Exam  Constitutional: She is oriented to person, place, and time. She appears well-developed and well-nourished. No distress.  HENT:  Head: Normocephalic and atraumatic.  Mouth/Throat: Oropharynx is clear and moist. No oropharyngeal exudate.  Shoulder length brown hair.  Eyes: Pupils are equal, round, and reactive  to light. Conjunctivae and EOM are normal. No scleral icterus.  Neck: Normal range of motion. Neck supple. No JVD present.  Cardiovascular: Normal rate, regular rhythm and normal heart sounds.  No murmur heard. Pulmonary/Chest: Effort normal and breath sounds normal. No respiratory distress.  Abdominal: Soft. Bowel sounds are normal. She exhibits no mass. There is no abdominal tenderness. There is no rebound and no guarding.  Musculoskeletal: Normal range of motion.        General: No tenderness or edema.  Lymphadenopathy:    She has no cervical adenopathy.    She has no axillary adenopathy.       Right: No supraclavicular adenopathy present.       Left: No supraclavicular adenopathy present.  Neurological: She is alert and oriented to person, place, and time. She has normal reflexes.  Skin: Skin is warm and dry. No rash noted. She is not diaphoretic. No erythema. No pallor.  Psychiatric: She has a normal mood and affect. Her behavior is normal. Judgment and thought content normal.  Nursing note and vitals reviewed.   Appointment on 02/24/2019  Component Date Value Ref Range Status  . Ferritin 02/24/2019 20  11 - 307 ng/mL Final   Performed at [facility], [address] [place], [place] [address]  . WBC 02/24/2019 6.2  4.0 - 10.5 K/uL Final  . RBC 02/24/2019 4.25  3.87 - 5.11 MIL/uL Final  . Hemoglobin 02/24/2019 13.5  12.0 - 15.0 g/dL Final  . HCT 91/47/829507/03/2019 40.8  36.0 - 46.0 % Final  . MCV 02/24/2019 96.0  80.0 - 100.0 fL Final  . MCH 02/24/2019 31.8  26.0 - 34.0 pg Final  . MCHC 02/24/2019 33.1  30.0 - 36.0 g/dL Final  . RDW 62/13/086507/03/2019 12.5  11.5 - 15.5 % Final  . Platelets 02/24/2019 197  150 - 400 K/uL Final  . nRBC 02/24/2019 0.0  0.0 - 0.2 % Final  . Neutrophils Relative % 02/24/2019 65  % Final  . Neutro Abs 02/24/2019 4.0  1.7 - 7.7 K/uL Final  . Lymphocytes Relative 02/24/2019 25  % Final  . Lymphs Abs 02/24/2019 1.6  0.7 - 4.0 K/uL Final  . Monocytes  Relative 02/24/2019 7  % Final  . Monocytes Absolute 02/24/2019 0.5  0.1 - 1.0 K/uL Final  . Eosinophils Relative 02/24/2019 2  % Final  . Eosinophils Absolute 02/24/2019 0.1  0.0 - 0.5 K/uL Final  . Basophils Relative 02/24/2019 1  % Final  . Basophils Absolute 02/24/2019 0.0  0.0 - 0.1 K/uL Final  . Immature Granulocytes 02/24/2019 0  % Final  . Abs Immature Granulocytes 02/24/2019 0.01  0.00 - 0.07 K/uL Final   Performed at [facility], [address] [place], [place] [address]    Assessment:  Latasha Latasha [name] Latasha [name] is a 33 y.o. female with chronic constipation and iron deficiency anemia.  Ferritin was 3 on 10/04/2014 consistent with iron deficiency.  She denies any melena or hematochezia.  She has a history of heavy menses felt secondary to endometriosis.  Menses is now light on BCP.  EGD was normal in 08/2016.  Colonoscopy was normal in 09/2015.  She has a history of microscopic hematuria.  CT hematuria work-up on 04/29/2017 was negative.  She is followed by urology.  Work-up on 03/08/2015 confirmed iron deficiency (ferritin 5). B12 and folate were normal.   She has received Venofer 600 mg (03/15/2015 - 04/19/2015), x2 (08/09/2015 - 08/16/2015), x1 (10/31/2015), x2 (05/08/2016 - 05/15/2016), x2 (03/05/2017 - 03/12/2017), x2 (06/04/2017 - 06/11/2017), x3 (12/24/2017 - 01/15/2018), and x 2 (06/24/2018 - 07/02/2019). She receives Venofer if ferritin is < 50 with symptoms.  Ferritin has been followed: 5 on 03/08/2015, 28 on 03/29/2015, 64 on 04/26/2015, 9 on 07/26/2015, 40 on 08/23/2015, 35 on 09/20/2015 29 on 10/25/2015, 32 on 02/07/2016, 36 on 05/08/2016, 39 on 02/17/2017, 39 on 05/27/2017, 42 on 09/03/2017, 32 on 12/23/2017, 68 on 03/26/2018, and 42 on 06/23/2018.  She has a son with factor IX deficiency.  Another son is being evaluated for von Willebrand's disease.  Work-up on 03/08/2015 revealed a normal platelet count, PT, PTT, von Willebrand panel, and multimers.  She  has a history of microscopic hematuria.  CT hematuria workup on 04/29/2017 revealed no findings to account for the patient's microscopic hematuria. She is followed by urology. Repeat urinalysis revealed no microscopic hematuria.  Symptomatically, she is slightly fatigued.  She has early ice  pica  Plan: 1.   Review labs from 02/24/2019.  2.   Iron deficiency  Hemoglobin 13.5.  MCV 96.  Ferritin 20.  Discuss plan for IV iron as ferritin < 30 and symptomatic.  Venofer today and in 1 week.  Urine pregnancy test prior to infusion. 3.   RTC in 3 months for labs (CBC with diff, ferritin). 4.   RTC in 6 months for MD assessment, labs (CBC with diff, ferritin - the day before), +/-Venofer .  I discussed the assessment and treatment plan with the patient.  The patient was provided an opportunity to ask questions and all were answered.  The patient agreed with the plan and demonstrated an understanding of the instructions.  The patient was advised to call back if the symptoms worsen or if the condition fails to improve as anticipated.    Latasha Latasha [name] Latasha [name] , MD, PhD    02/25/2019, 1:54 PM  I, Latasha Latasha [name] Latasha [name], am acting as scribe for [employer] Latasha [name]. Merlene Pullingorcoran, MD, PhD.  I,  Latasha [name]. Merlene Pullingorcoran, MD, have reviewed the above documentation for accuracy and completeness, and I agree with the above.

## 2019-02-24 ENCOUNTER — Inpatient Hospital Stay: Payer: Medicare Other | Attending: Hematology and Oncology

## 2019-02-24 ENCOUNTER — Other Ambulatory Visit: Payer: Self-pay

## 2019-02-24 DIAGNOSIS — R5383 Other fatigue: Secondary | ICD-10-CM | POA: Diagnosis not present

## 2019-02-24 DIAGNOSIS — F5089 Other specified eating disorder: Secondary | ICD-10-CM | POA: Diagnosis not present

## 2019-02-24 DIAGNOSIS — Q431 Hirschsprung's disease: Secondary | ICD-10-CM | POA: Insufficient documentation

## 2019-02-24 DIAGNOSIS — Z809 Family history of malignant neoplasm, unspecified: Secondary | ICD-10-CM | POA: Insufficient documentation

## 2019-02-24 DIAGNOSIS — K5900 Constipation, unspecified: Secondary | ICD-10-CM | POA: Insufficient documentation

## 2019-02-24 DIAGNOSIS — F419 Anxiety disorder, unspecified: Secondary | ICD-10-CM | POA: Insufficient documentation

## 2019-02-24 DIAGNOSIS — J45909 Unspecified asthma, uncomplicated: Secondary | ICD-10-CM | POA: Insufficient documentation

## 2019-02-24 DIAGNOSIS — R55 Syncope and collapse: Secondary | ICD-10-CM | POA: Diagnosis not present

## 2019-02-24 DIAGNOSIS — R110 Nausea: Secondary | ICD-10-CM | POA: Diagnosis not present

## 2019-02-24 DIAGNOSIS — D509 Iron deficiency anemia, unspecified: Secondary | ICD-10-CM | POA: Insufficient documentation

## 2019-02-24 DIAGNOSIS — Z79899 Other long term (current) drug therapy: Secondary | ICD-10-CM | POA: Insufficient documentation

## 2019-02-24 LAB — CBC WITH DIFFERENTIAL/PLATELET
Abs Immature Granulocytes: 0.01 10*3/uL (ref 0.00–0.07)
Basophils Absolute: 0 10*3/uL (ref 0.0–0.1)
Basophils Relative: 1 %
Eosinophils Absolute: 0.1 10*3/uL (ref 0.0–0.5)
Eosinophils Relative: 2 %
HCT: 40.8 % (ref 36.0–46.0)
Hemoglobin: 13.5 g/dL (ref 12.0–15.0)
Immature Granulocytes: 0 %
Lymphocytes Relative: 25 %
Lymphs Abs: 1.6 10*3/uL (ref 0.7–4.0)
MCH: 31.8 pg (ref 26.0–34.0)
MCHC: 33.1 g/dL (ref 30.0–36.0)
MCV: 96 fL (ref 80.0–100.0)
Monocytes Absolute: 0.5 10*3/uL (ref 0.1–1.0)
Monocytes Relative: 7 %
Neutro Abs: 4 10*3/uL (ref 1.7–7.7)
Neutrophils Relative %: 65 %
Platelets: 197 10*3/uL (ref 150–400)
RBC: 4.25 MIL/uL (ref 3.87–5.11)
RDW: 12.5 % (ref 11.5–15.5)
WBC: 6.2 10*3/uL (ref 4.0–10.5)
nRBC: 0 % (ref 0.0–0.2)

## 2019-02-24 LAB — FERRITIN: Ferritin: 20 ng/mL (ref 11–307)

## 2019-02-25 ENCOUNTER — Inpatient Hospital Stay: Payer: Medicare Other

## 2019-02-25 ENCOUNTER — Inpatient Hospital Stay (HOSPITAL_BASED_OUTPATIENT_CLINIC_OR_DEPARTMENT_OTHER): Payer: Medicare Other | Admitting: Hematology and Oncology

## 2019-02-25 ENCOUNTER — Encounter: Payer: Self-pay | Admitting: Hematology and Oncology

## 2019-02-25 VITALS — BP 114/75 | HR 64 | Temp 98.5°F | Resp 16 | Wt 138.2 lb

## 2019-02-25 VITALS — BP 105/67 | HR 74 | Temp 98.2°F | Resp 16

## 2019-02-25 DIAGNOSIS — K5900 Constipation, unspecified: Secondary | ICD-10-CM

## 2019-02-25 DIAGNOSIS — R110 Nausea: Secondary | ICD-10-CM

## 2019-02-25 DIAGNOSIS — R5383 Other fatigue: Secondary | ICD-10-CM

## 2019-02-25 DIAGNOSIS — D509 Iron deficiency anemia, unspecified: Secondary | ICD-10-CM | POA: Diagnosis not present

## 2019-02-25 DIAGNOSIS — J45909 Unspecified asthma, uncomplicated: Secondary | ICD-10-CM

## 2019-02-25 DIAGNOSIS — F419 Anxiety disorder, unspecified: Secondary | ICD-10-CM

## 2019-02-25 DIAGNOSIS — Z809 Family history of malignant neoplasm, unspecified: Secondary | ICD-10-CM

## 2019-02-25 DIAGNOSIS — F5089 Other specified eating disorder: Secondary | ICD-10-CM

## 2019-02-25 DIAGNOSIS — Q431 Hirschsprung's disease: Secondary | ICD-10-CM | POA: Diagnosis not present

## 2019-02-25 DIAGNOSIS — D508 Other iron deficiency anemias: Secondary | ICD-10-CM

## 2019-02-25 DIAGNOSIS — R55 Syncope and collapse: Secondary | ICD-10-CM

## 2019-02-25 DIAGNOSIS — Z79899 Other long term (current) drug therapy: Secondary | ICD-10-CM

## 2019-02-25 LAB — PREGNANCY, URINE: Preg Test, Ur: NEGATIVE

## 2019-02-25 MED ORDER — IRON SUCROSE 20 MG/ML IV SOLN: 200.0000 mg | Freq: Once | INTRAVENOUS | Status: DC

## 2019-02-25 MED ORDER — SODIUM CHLORIDE 0.9 % IV SOLN: 100.0000 mg | Freq: Once | INTRAVENOUS | Status: DC

## 2019-02-25 MED ORDER — IRON SUCROSE 20 MG/ML IV SOLN
100.0000 mg | Freq: Once | INTRAVENOUS | Status: AC
  Administered 2019-02-25: 14:00:00 100 mg via INTRAVENOUS

## 2019-02-25 MED ORDER — SODIUM CHLORIDE 0.9 % IV SOLN
Freq: Once | INTRAVENOUS | Status: AC
  Administered 2019-02-25: 14:00:00 via INTRAVENOUS
  Filled 2019-02-25: qty 250

## 2019-02-25 NOTE — Progress Notes (Signed)
Pt here for follow up. Denies any concerns at this time.  

## 2019-03-03 ENCOUNTER — Other Ambulatory Visit: Payer: Self-pay

## 2019-03-04 ENCOUNTER — Inpatient Hospital Stay: Payer: Medicare Other

## 2019-03-04 VITALS — BP 112/71 | HR 58 | Temp 98.3°F | Resp 16

## 2019-03-04 DIAGNOSIS — D509 Iron deficiency anemia, unspecified: Secondary | ICD-10-CM | POA: Diagnosis not present

## 2019-03-04 DIAGNOSIS — D508 Other iron deficiency anemias: Secondary | ICD-10-CM

## 2019-03-04 MED ORDER — SODIUM CHLORIDE 0.9 % IV SOLN
Freq: Once | INTRAVENOUS | Status: AC
  Administered 2019-03-04: 13:00:00 via INTRAVENOUS
  Filled 2019-03-04: qty 250

## 2019-03-04 MED ORDER — SODIUM CHLORIDE 0.9 % IV SOLN: 100.0000 mg | Freq: Once | INTRAVENOUS | Status: DC

## 2019-03-04 MED ORDER — IRON SUCROSE 20 MG/ML IV SOLN
100.0000 mg | Freq: Once | INTRAVENOUS | Status: AC
  Administered 2019-03-04: 14:00:00 100 mg via INTRAVENOUS

## 2019-03-04 NOTE — Patient Instructions (Signed)

## 2019-04-30 IMAGING — CT CT ABD-PEL WO/W CM
2 of 4 series · 13 of 32 positions shown, 19 images · IV contrast (APPLIED)
Comparison: None.

CLINICAL DATA: Microscopic hematuria

EXAM:
CT ABDOMEN AND PELVIS WITHOUT AND WITH CONTRAST
TECHNIQUE: Multidetector CT imaging of the abdomen and pelvis was performed
following the standard protocol before and following the bolus
administration of intravenous contrast.
CONTRAST:  100mL VJ8P3Y-D99 IOPAMIDOL (VJ8P3Y-D99) INJECTION 61%

[Series 8: axial delay · axial · delayed · 0.75mm/px · z∈[-1006,-590]mm · 10 of 103 slices shown, 16 images]
[im 10/103  soft-tissue]
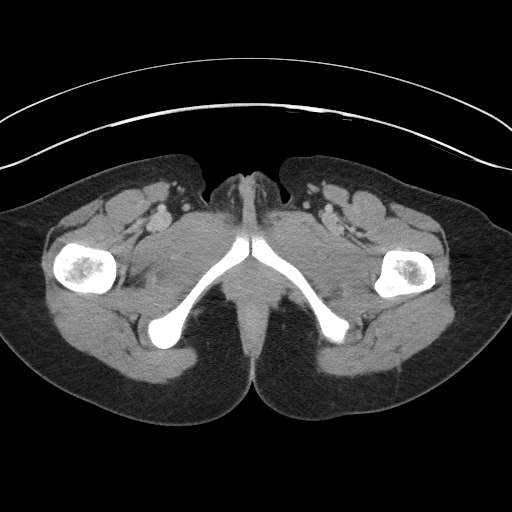
[im 10/103  bone]
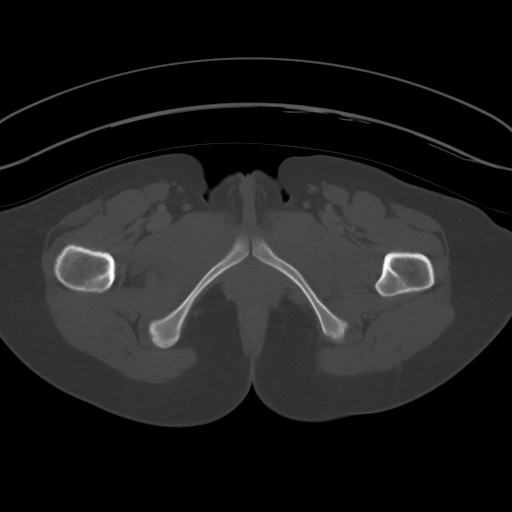
[im 19/103  soft-tissue]
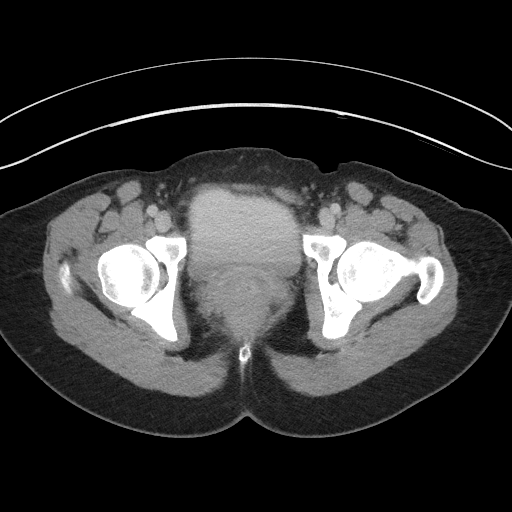
[im 28/103  soft-tissue]
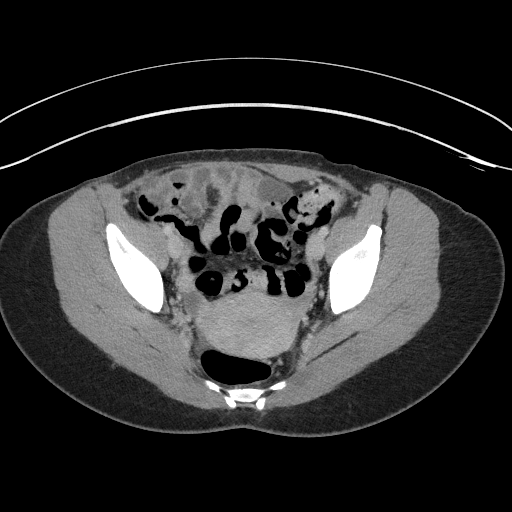
[im 38/103  soft-tissue]
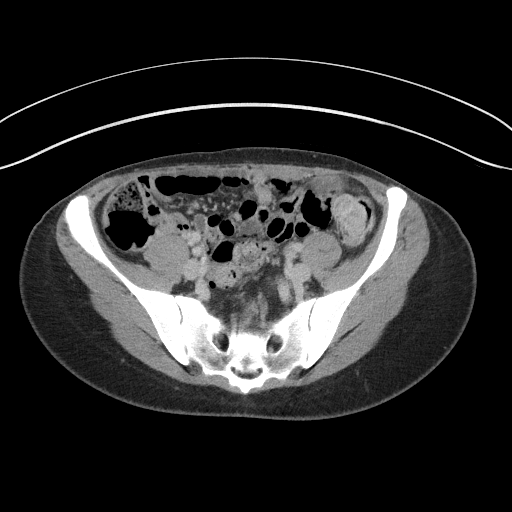
[im 47/103  soft-tissue]
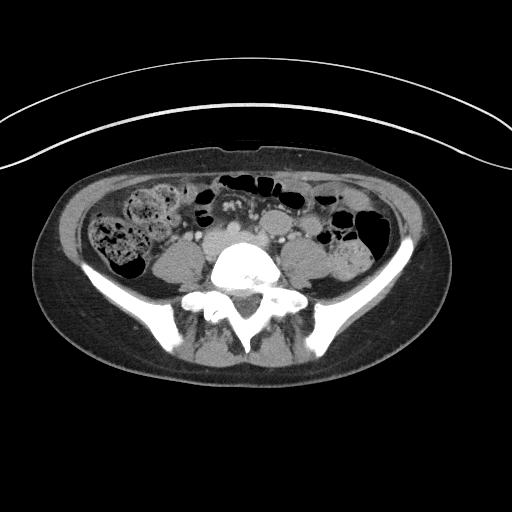
[im 56/103  soft-tissue]
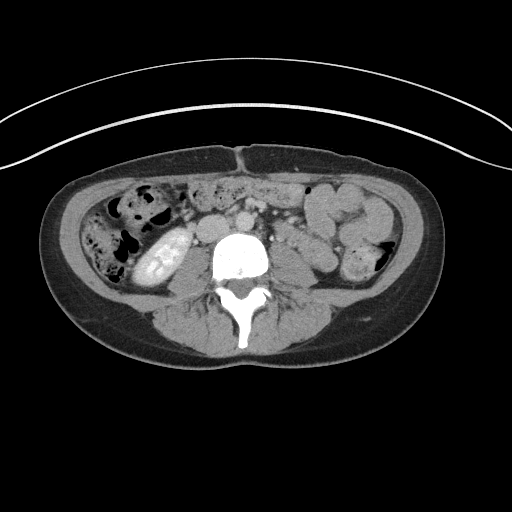
[im 65/103  soft-tissue]
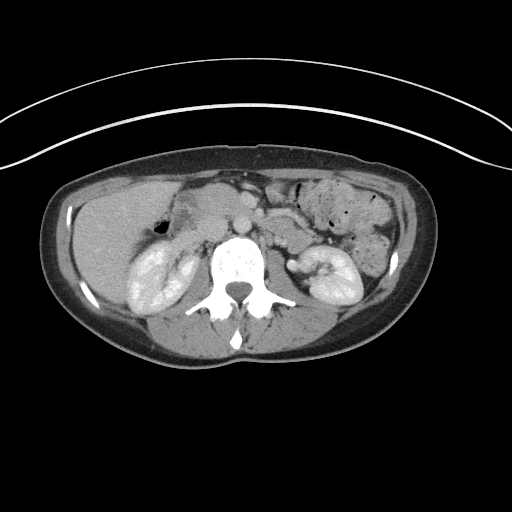
[im 65/103  lung]
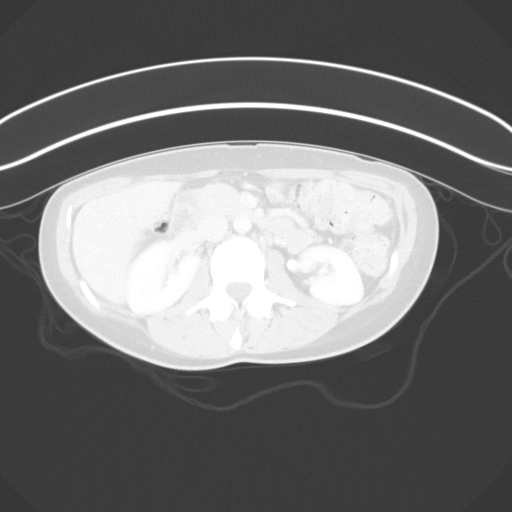
[im 75/103  soft-tissue]
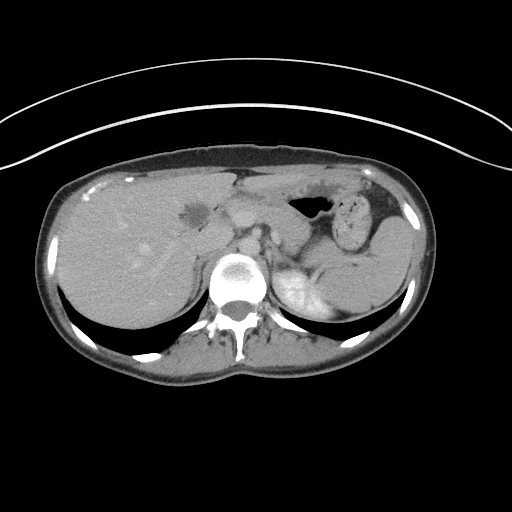
[im 75/103  lung]
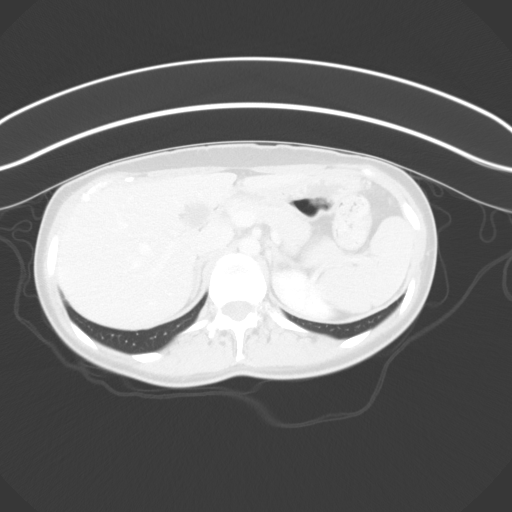
[im 84/103  soft-tissue]
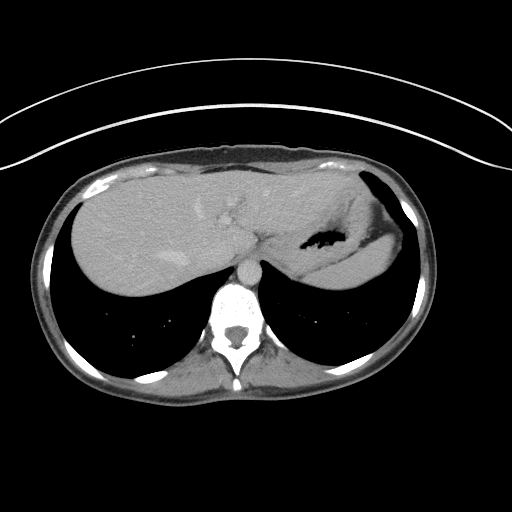
[im 84/103  lung]
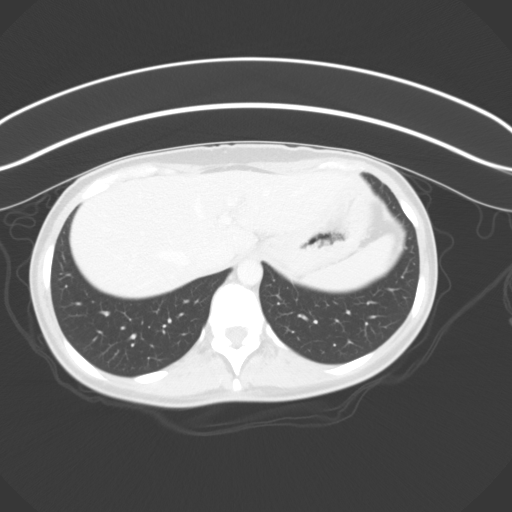
[im 84/103  bone]
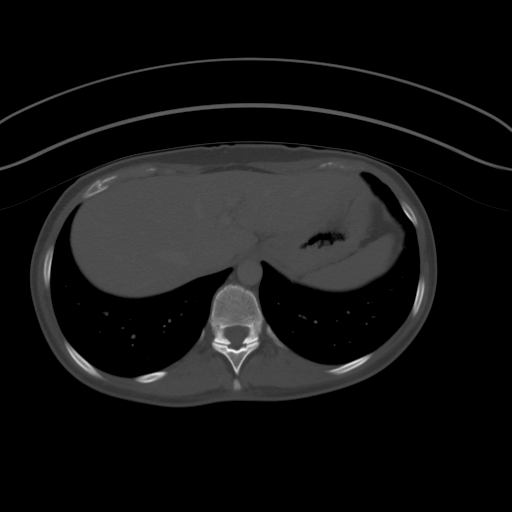
[im 93/103  soft-tissue]
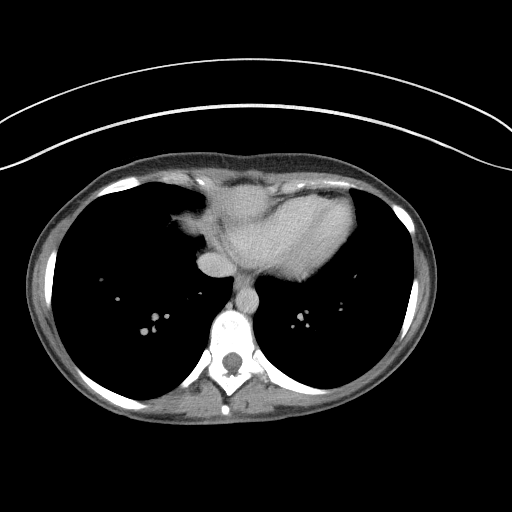
[im 93/103  lung]
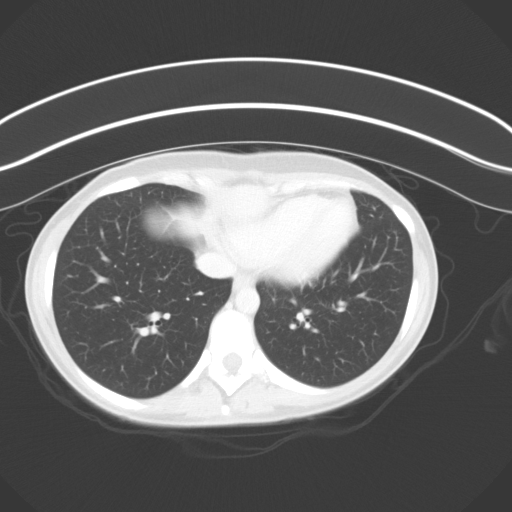

[Series 12: lung post · axial · 0.63mm/px · z∈[-690,-590]mm · 3 of 41 slices shown]
[im 11/41  bone]
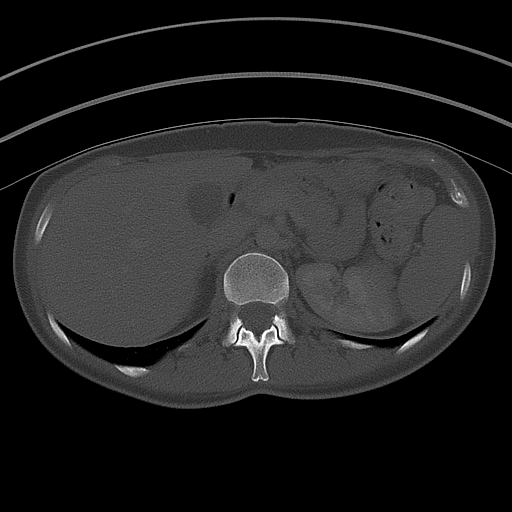
[im 21/41  bone]
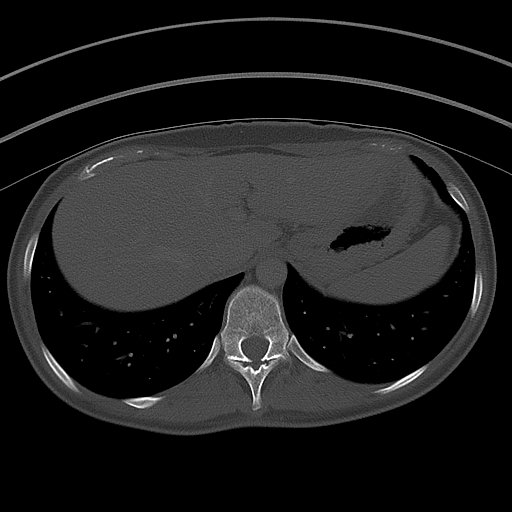
[im 31/41  bone]
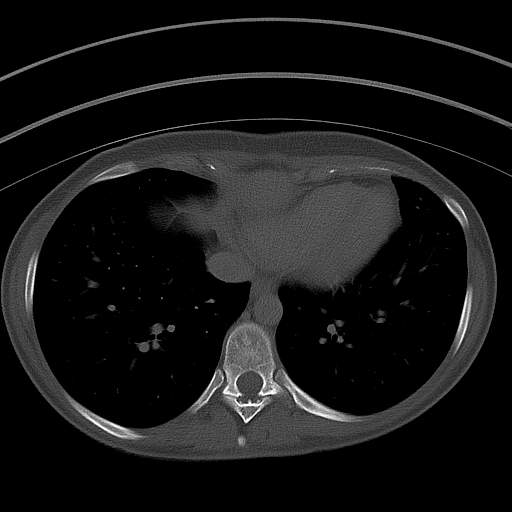

[13 of 32 positions shown; findings below may reference images not displayed]

FINDINGS: Lower chest: Lung bases are clear.

Hepatobiliary: Liver is within normal limits.

Gallbladder is unremarkable. No intrahepatic or extrahepatic ductal
dilatation.

Pancreas: Within normal limits.

Spleen: Within normal limits.

Adrenals/Urinary Tract: Adrenal glands are within normal limits.

Kidneys are within normal limits.  No enhancing renal lesions.

No renal, ureteral, or bladder calculi.  No hydronephrosis.

Bladder is within normal limits.

Stomach/Bowel: Stomach is within normal limits.

No evidence of bowel obstruction.

Appendix is not discretely visualized.

Vascular/Lymphatic: No evidence of abdominal aortic aneurysm.

No suspicious abdominopelvic lymphadenopathy.

Reproductive: Uterus is within normal limits.

Bilateral ovaries are within normal limits.

Other: Trace pelvic ascites.

Musculoskeletal: Visualized osseous structures are within normal
limits.
IMPRESSION: No CT findings to account for the patient's hematuria.  8 minutes.

## 2019-05-27 ENCOUNTER — Inpatient Hospital Stay: Payer: Medicare Other | Attending: Hematology and Oncology

## 2019-06-03 ENCOUNTER — Other Ambulatory Visit: Payer: Self-pay

## 2019-06-04 ENCOUNTER — Inpatient Hospital Stay: Payer: Medicare Other | Attending: Hematology and Oncology

## 2019-07-20 ENCOUNTER — Ambulatory Visit (INDEPENDENT_AMBULATORY_CARE_PROVIDER_SITE_OTHER)
Admission: RE | Admit: 2019-07-20 | Discharge: 2019-07-20 | Disposition: A | Discharge status: Home / Self Care | Payer: Medicare Other | Source: Ambulatory Visit

## 2019-07-20 ENCOUNTER — Other Ambulatory Visit: Payer: Self-pay

## 2019-07-20 DIAGNOSIS — N898 Other specified noninflammatory disorders of vagina: Secondary | ICD-10-CM

## 2019-07-20 MED ORDER — METRONIDAZOLE 500 MG PO TABS: 500.0000 mg | ORAL_TABLET | Freq: Two times a day (BID) | ORAL | 0 refills | Status: DC

## 2019-07-20 NOTE — ED Provider Notes (Signed)
Virtual Visit via Video Note:  Latasha [name]  initiated request for Telemedicine visit with [facility] Urgent [facility] team. I connected with Latasha [name]  on 07/20/2019 at 1:43 PM  for a synchronized telemedicine visit using a video enabled HIPPA compliant telemedicine application. I verified that I am speaking with Latasha [name]  using two identifiers. Mickie Bail, NP  was physically located in a [facility] Urgent care site and ASHLING ROANE was located at a different location.   The limitations of evaluation and management by telemedicine as well as the availability of in-person appointments were discussed. Patient was informed that she  may incur a bill ( including co-pay) for this virtual visit encounter. Latasha [name]  expressed understanding and gave verbal consent to proceed with virtual visit.     History of Present Illness:Latasha [name]  is a 33 y.o. female presents for evaluation of malodorous gray vaginal discharge x 2 days.  Denies fever, chills, abdominal pain, pelvic pain, back pain, rash, or lesion. She reports this is similar to previous episodes of bacterial vaginitis which has responded to treatment with Flagyl; last episode 6 months ago.  LMP: on OCP, denies pregnancy or breastfeeding.    Allergies  Allergen Reactions  . Meperidine Anaphylaxis    Other reaction(s): Other (See Comments) Other Reaction: Not Assessed  . Lactose     Other reaction(s): Other (See Comments) Other Reaction: GI Upset  . Fludrocortisone Hives  . Levofloxacin Hives  . Sulfamethoxazole-Trimethoprim Other (See Comments)    Other reaction(s): Dizziness     Past Medical History:  Diagnosis Date  . Acid reflux   . Anemia   . Anxiety   . Asthma   . Chronic constipation   . Hirschsprung disease   . Pelvic floor dysfunction   . Syncope, cardiogenic      Social History   Tobacco Use  . Smoking status: Never Smoker  . Smokeless tobacco: Never Used  Substance Use  Topics  . Alcohol use: No  . Drug use: No        Observations/Objective: Physical Exam  VITALS: Patient denies fever. GENERAL: Alert, appears well and in no acute distress. HEENT: Atraumatic. NECK: Normal movements of the head and neck. CARDIOPULMONARY: No increased WOB. Speaking in clear sentences. I:E ratio WNL.  MS: Moves all visible extremities without noticeable abnormality. PSYCH: Pleasant and cooperative, well-groomed. Speech normal rate and rhythm. Affect is appropriate. Insight and judgement are appropriate. Attention is focused, linear, and appropriate.  NEURO: CN grossly intact. Oriented as arrived to appointment on time with no prompting. Moves both UE equally.    Assessment and Plan:    ICD-10-CM   1. Vaginal discharge  N89.8        Follow Up Instructions: Treating with a 7-day course of Flagyl.  Instructed patient to refrain from sex for 7 days.  Instructed her to be seen in person either by her PCP or here if her symptoms are not improving.  Discussed that we will need a vaginal swab at that time.  Patient agrees to this plan of care.    I discussed the assessment and treatment plan with the patient. The patient was provided an opportunity to ask questions and all were answered. The patient agreed with the plan and demonstrated an understanding of the instructions.   The patient was advised to call back or seek an in-person evaluation if the symptoms worsen or if the condition fails to improve as  anticipated.      Sharion Balloon, NP  07/20/2019 1:43 PM        Sharion Balloon, NP 07/20/19 1344

## 2019-07-20 NOTE — Discharge Instructions (Addendum)
Take the Flagyl as directed.  Do not have sex for 7 days.    Please come in to be seen in person either here or with your primary care provider if your symptoms are not improving.

## 2019-08-24 NOTE — Progress Notes (Signed)
[facility]  [address] [place], [place] [address] Phone: [phone]  Fax: [phone]   Telemedicine Office Visit:  08/26/2019  Referring physician: Rayetta Humphrey, MD  I connected with Latasha [name] on 08/26/2019 at 1:50 PM by videoconferencing and verified that I was speaking with the correct person using 2 identifiers.  The patient was at home.  I discussed the limitations, risk, security and privacy concerns of performing an evaluation and management service by videoconferencing and the availability of in person appointments.  I also discussed with the patient that there may be a patient responsible charge related to this service.  The patient expressed understanding and agreed to proceed.   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  HPI: The patient was last seen in the hematology clinic on 02/25/2019. At that time, she was slightly fatigued. She had early ice pica. Hematocrit 40.8, hemoglobin 13.5, MCV 96.0, platelets 197,000, WBC 6,200. Ferritin was 20.   She received Venofer weekly x 2 (02/25/2019 - 03/04/2019).   Labs on 08/25/2019 revealed a hematocrit 41.8, hemoglobin 14.1, MCV 93.7, platelets 205,000, WBC 5,300. Ferritin 70.    During the interim, she has felt "pretty good". She tolerated the Venofer well. She denies being fatigued. She has no ice pica. She has no bloody or black stools. She denies any chest pains. She is not lightheaded or dizzy. She notes a "bubbling sensation" in her heart especially after she eats. She reports a normal menstrual cycle. She notes her bowels are "bad".   Past Medical History:  Diagnosis Date  . Acid reflux   . Anemia   . Anxiety   . Asthma   . Chronic constipation   . Hirschsprung disease   . Pelvic floor dysfunction   . Syncope, cardiogenic     Past Surgical History:  Procedure  Laterality Date  . abdomial adhesions removed  2012  . APPENDECTOMY  1997  . bladder repair surgery  1997  . c- sections  [id number]  . enodmetiosis removed  2012  . OVARIAN CYST REMOVAL  2003, 2012  . TONSILLECTOMY  2007    Family History  Problem Relation Age of Onset  . Cancer Mother   . Diabetes Mother   . Hypertension Mother   . Cancer Father   . Cancer Maternal Grandfather   . Factor IX deficiency Maternal Grandfather   . Factor IX deficiency Son   . Kidney cancer Neg Hx   . Bladder Cancer Neg Hx     Social History:  reports that she has never smoked. She has never used smokeless tobacco. She reports that she does not drink alcohol or use drugs. She lives in [place]. The patient is alone today.  Participants in the patient's visit and their role in the encounter included the patient and Delano Metz, RN, today.  The intake visit was provided by Delano Metz, RN.  Allergies:  Allergies  Allergen Reactions  . Meperidine Anaphylaxis    Other reaction(s): Other (See Comments) Other Reaction: Not Assessed  . Lactose     Other reaction(s): Other (See Comments) Other Reaction: GI Upset  . Fludrocortisone Hives  . Levofloxacin Hives  . Sulfamethoxazole-Trimethoprim Other (See Comments)    Other reaction(s): Dizziness    Current Medications: Current Outpatient Medications  Medication Sig Dispense Refill  . Acidophilus Lactobacillus CAPS Take 1 tablet by mouth daily.     Marland Kitchen  albuterol (PROAIR HFA) 108 (90 Base) MCG/ACT inhaler Inhale 1 puff into the lungs every 6 (six) hours as needed for wheezing or shortness of breath.     . EPINEPHrine (EPIPEN 2-PAK) 0.3 mg/0.3 mL IJ SOAJ injection Inject 0.3 mg into the muscle as needed. Reported on 02/07/2016    . fluticasone (FLONASE) 50 MCG/ACT nasal spray SHAKE LQ AND U 2 SPRAYS IEN QD    . hydrOXYzine (ATARAX/VISTARIL) 25 MG tablet TK 1 T PO QD PRN    . midodrine (PROAMATINE) 10 MG tablet Take 10 mg by mouth daily.       . norethindrone-ethinyl estradiol (JUNEL 1/20) 1-20 MG-MCG tablet Take 1 tablet by mouth daily.    Marland Kitchen omeprazole (PRILOSEC) 40 MG capsule 1 capsule by mouth daily  0  . ondansetron (ZOFRAN) 4 MG tablet Take 4 mg by mouth every 8 (eight) hours as needed for nausea.     . Plecanatide 3 MG TABS Take 3 mg by mouth daily.     . promethazine (PHENERGAN) 25 MG tablet Take 25 mg by mouth every 8 (eight) hours as needed for vomiting.     . sertraline (ZOLOFT) 100 MG tablet Take 200 mg by mouth daily.     . Sodium Phosphates (RA ENEMA) 7-19 GM/118ML ENEM Place rectally as needed (constipation).      No current facility-administered medications for this visit.    Review of Systems  Constitutional: Negative for chills, diaphoresis, fever, malaise/fatigue (slight; improved) and weight loss.       Feels "pretty good".  HENT: Negative.  Negative for congestion, ear pain, nosebleeds, sinus pain and sore throat.   Eyes: Negative.  Negative for blurred vision and double vision.  Respiratory: Negative.  Negative for cough, hemoptysis, sputum production and shortness of breath.   Cardiovascular: Negative.  Negative for chest pain, palpitations, orthopnea, leg swelling and PND.       "Bubbling sensation" in her heart especially after eating.  Gastrointestinal: Negative for abdominal pain, blood in stool, constipation, diarrhea, melena, nausea and vomiting.       No ice pica. "Bad" bowels.  Genitourinary: Negative.  Negative for dysuria, frequency, hematuria and urgency.       Normal menstrual cycles.   Musculoskeletal: Negative.  Negative for back pain, falls, joint pain and myalgias.  Skin: Negative.  Negative for itching and rash.  Neurological: Negative.  Negative for dizziness, tremors, speech change, focal weakness, weakness and headaches.  Endo/Heme/Allergies: Bruises/bleeds easily.  Psychiatric/Behavioral: Negative.  Negative for depression and memory loss. The patient is not nervous/anxious and does  not have insomnia.   All other systems reviewed and are negative.  Performance status (ECOG):  0  Physical Exam  Constitutional: She is oriented to person, place, and time. She appears well-developed and well-nourished. No distress.  HENT:  Head: Normocephalic and atraumatic.  Long brown hair.  Eyes: Conjunctivae and EOM are normal. No scleral icterus.  Neurological: She is alert and oriented to person, place, and time.  Skin: She is not diaphoretic.  Psychiatric: She has a normal mood and affect. Her behavior is normal. Judgment and thought content normal.  Nursing note reviewed.   Appointment on 08/25/2019  Component Date Value Ref Range Status  . Ferritin 08/25/2019 70  11 - 307 ng/mL Final   Performed at [facility], [address] [place] [place], [place] [address]  . WBC 08/25/2019 5.3  4.0 - 10.5 K/uL Final  . RBC 08/25/2019 4.46  3.87 - 5.11 MIL/uL Final  .  Hemoglobin 08/25/2019 14.1  12.0 - 15.0 g/dL Final  . HCT 08/25/2019 41.8  36.0 - 46.0 % Final  . MCV 08/25/2019 93.7  80.0 - 100.0 fL Final  . MCH 08/25/2019 31.6  26.0 - 34.0 pg Final  . MCHC 08/25/2019 33.7  30.0 - 36.0 g/dL Final  . RDW 08/25/2019 11.9  11.5 - 15.5 % Final  . Platelets 08/25/2019 205  150 - 400 K/uL Final  . nRBC 08/25/2019 0.0  0.0 - 0.2 % Final  . Neutrophils Relative % 08/25/2019 51  % Final  . Neutro Abs 08/25/2019 2.7  1.7 - 7.7 K/uL Final  . Lymphocytes Relative 08/25/2019 36  % Final  . Lymphs Abs 08/25/2019 1.9  0.7 - 4.0 K/uL Final  . Monocytes Relative 08/25/2019 9  % Final  . Monocytes Absolute 08/25/2019 0.5  0.1 - 1.0 K/uL Final  . Eosinophils Relative 08/25/2019 3  % Final  . Eosinophils Absolute 08/25/2019 0.2  0.0 - 0.5 K/uL Final  . Basophils Relative 08/25/2019 1  % Final  . Basophils Absolute 08/25/2019 0.0  0.0 - 0.1 K/uL Final  . Immature Granulocytes 08/25/2019 0  % Final  . Abs Immature Granulocytes 08/25/2019 0.01  0.00 - 0.07 K/uL Final   Performed at [facility], [address], [place], [place] [address]    Assessment:  Latasha [name] is a 34 y.o. female with chronic constipation andiron deficiency anemia. Ferritin was 3 on 10/04/2014 consistent with iron deficiency. She denies any melena or hematochezia. She has a history of heavy mensesfelt secondary to endometriosis. Menses is now light on BCP.  EGDwas normal in 08/2016. Colonoscopywas normal in 09/2015. She has a history ofmicroscopic hematuria. CT hematuria work-up on 04/29/2017 was negative. She is followed by urology.  Work-upon 07/20/2016confirmed iron deficiency (ferritin 5). B12 and folate were normal.   She has received Venofer 600 mg (03/15/2015 - 04/19/2015), x2 (08/09/2015 - 08/16/2015), x1 (10/31/2015), x2 (05/08/2016 - 05/15/2016), x2 (03/05/2017 - 03/12/2017), x2 (06/04/2017 - 06/11/2017), x3 (12/24/2017 - 01/15/2018), and x 2 (06/24/2018 - 07/02/2019). She receives Venoferif ferritin is <50 with symptoms.  Ferritinhas been followed: 5 on 03/08/2015, 28 on 03/29/2015, 64 on 04/26/2015, 9 on 07/26/2015, 40 on 08/23/2015, 35 on 09/20/2015 29 on 10/25/2015, 32 on 02/07/2016, 36 on 05/08/2016, 39 on 02/17/2017, 39 on 05/27/2017, 42 on 09/03/2017, 32 on 12/23/2017, 68 on 03/26/2018, 42 on 06/23/2018 102 on 09/24/2018, 32 on 12/22/2018, 20 on 02/24/2019, and 70 on 08/25/2019.  She has a son with factor IX deficiency. Another son is being evaluated for von Willebrand's disease. Work-up on 07/20/2016revealed a normal platelet count, PT, PTT, von Willebrand panel, and multimers.  She has a history of microscopic hematuria. CT hematuria workup on 04/29/2017 revealed no findings to account for the patient's microscopic hematuria. She is followed by urology. Repeat urinalysis revealed no microscopic hematuria.  Symptomatically, she is doing well.  She denies any bleeding.  She denies any pica.  Plan: 1.   Review labs from 08/25/2019. 2.   Iron  deficiency             Hematocrit 41.8.  Hemoglobin 14.1.  MCV 93.7.             Ferritin 70.             Continue IV iron if ferritin < 30 and symptomatic.             No Venofer needed. 3.   RTC in  3 months for labs (CBC with diff, ferritin). 4.   RTC in 6 months for MD assessment, labs (CBC with diff, ferritin today before), +/- Venofer.  I discussed the assessment and treatment plan with the patient.  The patient was provided an opportunity to ask questions and all were answered.  The patient agreed with the plan and demonstrated an understanding of the instructions.  The patient was advised to call back if the symptoms worsen or if the condition fails to improve as anticipated.   Rosey Bath, MD, PhD    08/26/2019, 1:50 PM  I, Theador Hawthorne, am acting as scribe for [employer]. Merlene Pulling, MD, PhD.  I, Maleiah Dula C. Merlene Pulling, MD, have reviewed the above documentation for accuracy and completeness, and I agree with the above.

## 2019-08-25 ENCOUNTER — Other Ambulatory Visit: Payer: Self-pay

## 2019-08-25 ENCOUNTER — Inpatient Hospital Stay: Payer: Medicare Other | Attending: Hematology and Oncology

## 2019-08-25 DIAGNOSIS — D509 Iron deficiency anemia, unspecified: Secondary | ICD-10-CM | POA: Diagnosis present

## 2019-08-25 DIAGNOSIS — D508 Other iron deficiency anemias: Secondary | ICD-10-CM

## 2019-08-25 LAB — CBC WITH DIFFERENTIAL/PLATELET
Abs Immature Granulocytes: 0.01 10*3/uL (ref 0.00–0.07)
Basophils Absolute: 0 10*3/uL (ref 0.0–0.1)
Basophils Relative: 1 %
Eosinophils Absolute: 0.2 10*3/uL (ref 0.0–0.5)
Eosinophils Relative: 3 %
HCT: 41.8 % (ref 36.0–46.0)
Hemoglobin: 14.1 g/dL (ref 12.0–15.0)
Immature Granulocytes: 0 %
Lymphocytes Relative: 36 %
Lymphs Abs: 1.9 10*3/uL (ref 0.7–4.0)
MCH: 31.6 pg (ref 26.0–34.0)
MCHC: 33.7 g/dL (ref 30.0–36.0)
MCV: 93.7 fL (ref 80.0–100.0)
Monocytes Absolute: 0.5 10*3/uL (ref 0.1–1.0)
Monocytes Relative: 9 %
Neutro Abs: 2.7 10*3/uL (ref 1.7–7.7)
Neutrophils Relative %: 51 %
Platelets: 205 10*3/uL (ref 150–400)
RBC: 4.46 MIL/uL (ref 3.87–5.11)
RDW: 11.9 % (ref 11.5–15.5)
WBC: 5.3 10*3/uL (ref 4.0–10.5)
nRBC: 0 % (ref 0.0–0.2)

## 2019-08-25 LAB — FERRITIN: Ferritin: 70 ng/mL (ref 11–307)

## 2019-08-25 NOTE — Progress Notes (Signed)
Confirmed Name and DOB. Denies any concerns.  

## 2019-08-26 ENCOUNTER — Inpatient Hospital Stay: Payer: Medicare Other

## 2019-08-26 ENCOUNTER — Encounter: Payer: Self-pay | Admitting: Hematology and Oncology

## 2019-08-26 ENCOUNTER — Inpatient Hospital Stay (HOSPITAL_BASED_OUTPATIENT_CLINIC_OR_DEPARTMENT_OTHER): Payer: Medicare Other | Admitting: Hematology and Oncology

## 2019-08-26 DIAGNOSIS — D508 Other iron deficiency anemias: Secondary | ICD-10-CM

## 2019-11-05 ENCOUNTER — Ambulatory Visit: Payer: Medicare Other | Attending: Internal Medicine

## 2019-11-05 DIAGNOSIS — Z23 Encounter for immunization: Secondary | ICD-10-CM

## 2019-11-05 NOTE — Progress Notes (Signed)
   Covid-19 Vaccination Clinic  Name:  Latasha [name]    MRN: [id number] DOB: 06/15/1986  11/05/2019  [name] was observed post Covid-19 immunization for 30 minutes based on pre-vaccination screening without incident. She was provided with Vaccine Information Sheet and instruction to access the V-Safe system.   [name] was instructed to call 911 with any severe reactions post vaccine: Marland Kitchen Difficulty breathing  . Swelling of face and throat  . A fast heartbeat  . A bad rash all over body  . Dizziness and weakness   Immunizations Administered    Name Date Dose VIS Date Route   Pfizer COVID-19 Vaccine 11/05/2019  1:27 PM 0.3 mL 07/30/2019 Intramuscular   Manufacturer: [employer], [employer]   Lot: [id number]   NDC: [address]-7

## 2019-11-24 ENCOUNTER — Inpatient Hospital Stay: Payer: Medicare Other | Attending: Hematology and Oncology

## 2019-11-24 ENCOUNTER — Other Ambulatory Visit: Payer: Self-pay

## 2019-11-24 DIAGNOSIS — D509 Iron deficiency anemia, unspecified: Secondary | ICD-10-CM | POA: Diagnosis not present

## 2019-11-24 DIAGNOSIS — D508 Other iron deficiency anemias: Secondary | ICD-10-CM

## 2019-11-24 LAB — CBC WITH DIFFERENTIAL/PLATELET
Abs Immature Granulocytes: 0.02 10*3/uL (ref 0.00–0.07)
Basophils Absolute: 0 10*3/uL (ref 0.0–0.1)
Basophils Relative: 1 %
Eosinophils Absolute: 0.2 10*3/uL (ref 0.0–0.5)
Eosinophils Relative: 3 %
HCT: 37.1 % (ref 36.0–46.0)
Hemoglobin: 12.1 g/dL (ref 12.0–15.0)
Immature Granulocytes: 0 %
Lymphocytes Relative: 25 %
Lymphs Abs: 1.8 10*3/uL (ref 0.7–4.0)
MCH: 31.1 pg (ref 26.0–34.0)
MCHC: 32.6 g/dL (ref 30.0–36.0)
MCV: 95.4 fL (ref 80.0–100.0)
Monocytes Absolute: 0.5 10*3/uL (ref 0.1–1.0)
Monocytes Relative: 7 %
Neutro Abs: 4.7 10*3/uL (ref 1.7–7.7)
Neutrophils Relative %: 64 %
Platelets: 181 10*3/uL (ref 150–400)
RBC: 3.89 MIL/uL (ref 3.87–5.11)
RDW: 12.4 % (ref 11.5–15.5)
WBC: 7.2 10*3/uL (ref 4.0–10.5)
nRBC: 0 % (ref 0.0–0.2)

## 2019-11-24 LAB — FERRITIN: Ferritin: 36 ng/mL (ref 11–307)

## 2019-11-26 ENCOUNTER — Telehealth: Payer: Self-pay

## 2019-11-26 ENCOUNTER — Ambulatory Visit: Payer: Medicare Other | Attending: Internal Medicine

## 2019-11-26 DIAGNOSIS — Z23 Encounter for immunization: Secondary | ICD-10-CM

## 2019-11-26 NOTE — Progress Notes (Signed)
   Covid-19 Vaccination Clinic  Name:  Latasha [name]    MRN: [id number] DOB: December 03, 1985  11/26/2019  Ms. Latasha [name] was observed post Covid-19 immunization for 15 minutes without incident. She was provided with Vaccine Information Sheet and instruction to access the V-Safe system.   Ms. Latasha [name] was instructed to call 911 with any severe reactions post vaccine: Marland Kitchen Difficulty breathing  . Swelling of face and throat  . A fast heartbeat  . A bad rash all over body  . Dizziness and weakness   Immunizations Administered    Name Date Dose VIS Date Route   Pfizer COVID-19 Vaccine 11/26/2019 12:45 PM 0.3 mL 07/30/2019 Intramuscular   Manufacturer: [employer], [employer]   Lot: [id number]   NDC: [address]-9

## 2019-11-26 NOTE — Telephone Encounter (Signed)
Left a message To inform the patient that per Dr Merlene Pulling her ferritin has begain to difted down from 70 to 36 and typically consider venfor if ferritin < 30. I also informed the patient if she have any question please feel free to contact the office [phone].

## 2020-02-23 ENCOUNTER — Other Ambulatory Visit: Payer: Self-pay

## 2020-02-23 ENCOUNTER — Inpatient Hospital Stay: Payer: Medicare Other | Attending: Hematology and Oncology

## 2020-02-23 DIAGNOSIS — Z79899 Other long term (current) drug therapy: Secondary | ICD-10-CM | POA: Insufficient documentation

## 2020-02-23 DIAGNOSIS — D509 Iron deficiency anemia, unspecified: Secondary | ICD-10-CM | POA: Insufficient documentation

## 2020-02-23 DIAGNOSIS — Q431 Hirschsprung's disease: Secondary | ICD-10-CM | POA: Insufficient documentation

## 2020-02-23 DIAGNOSIS — R002 Palpitations: Secondary | ICD-10-CM | POA: Insufficient documentation

## 2020-02-23 DIAGNOSIS — F419 Anxiety disorder, unspecified: Secondary | ICD-10-CM | POA: Insufficient documentation

## 2020-02-23 DIAGNOSIS — R5383 Other fatigue: Secondary | ICD-10-CM | POA: Insufficient documentation

## 2020-02-23 DIAGNOSIS — J45909 Unspecified asthma, uncomplicated: Secondary | ICD-10-CM | POA: Diagnosis not present

## 2020-02-23 DIAGNOSIS — K219 Gastro-esophageal reflux disease without esophagitis: Secondary | ICD-10-CM | POA: Insufficient documentation

## 2020-02-23 DIAGNOSIS — R3129 Other microscopic hematuria: Secondary | ICD-10-CM | POA: Insufficient documentation

## 2020-02-23 DIAGNOSIS — K5909 Other constipation: Secondary | ICD-10-CM | POA: Insufficient documentation

## 2020-02-23 DIAGNOSIS — D508 Other iron deficiency anemias: Secondary | ICD-10-CM

## 2020-02-23 DIAGNOSIS — F5089 Other specified eating disorder: Secondary | ICD-10-CM | POA: Diagnosis not present

## 2020-02-23 LAB — CBC WITH DIFFERENTIAL/PLATELET
Abs Immature Granulocytes: 0.01 10*3/uL (ref 0.00–0.07)
Basophils Absolute: 0 10*3/uL (ref 0.0–0.1)
Basophils Relative: 0 %
Eosinophils Absolute: 0.1 10*3/uL (ref 0.0–0.5)
Eosinophils Relative: 2 %
HCT: 37.5 % (ref 36.0–46.0)
Hemoglobin: 12.5 g/dL (ref 12.0–15.0)
Immature Granulocytes: 0 %
Lymphocytes Relative: 35 %
Lymphs Abs: 2.2 10*3/uL (ref 0.7–4.0)
MCH: 31.1 pg (ref 26.0–34.0)
MCHC: 33.3 g/dL (ref 30.0–36.0)
MCV: 93.3 fL (ref 80.0–100.0)
Monocytes Absolute: 0.5 10*3/uL (ref 0.1–1.0)
Monocytes Relative: 8 %
Neutro Abs: 3.5 10*3/uL (ref 1.7–7.7)
Neutrophils Relative %: 55 %
Platelets: 213 10*3/uL (ref 150–400)
RBC: 4.02 MIL/uL (ref 3.87–5.11)
RDW: 12.3 % (ref 11.5–15.5)
WBC: 6.4 10*3/uL (ref 4.0–10.5)
nRBC: 0 % (ref 0.0–0.2)

## 2020-02-23 LAB — FERRITIN: Ferritin: 17 ng/mL (ref 11–307)

## 2020-02-23 NOTE — Progress Notes (Signed)
Patient reports increase in palpitations and SOB a lot of times after eating.

## 2020-02-23 NOTE — Progress Notes (Signed)
[facility]  [address] [place], [place] [address] Phone: [phone]  Fax: [phone]   Clinic Day:  02/24/2020  Referring physician: Rayetta Humphrey, MD  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  HPI: The patient was last seen in the hematology clinic on 08/26/2019 via telemedicine. At that time, she was doing well.  She denied any bleeding.  She denied any pica. Hematocrit was 41.8, hemoglobin 14.1, platelets 205,000, WBC 5,300. Ferritin was 70. No Venofer was needed.   Anorectal manometry was completed on 02/03/2020.  She had type III dyssynergia.  She was offered biofeedback therapy.  Labs followed: 11/24/2019: Hematocrit 37.1, hemoglobin 12.1, platelets 181,000, WBC 7,200. Ferritin 36. 02/23/2020: Hematocrit 37.5, hemoglobin 12.5, platelets 213,000, WBC 6,400. Ferritin 17.   During the interim, she has been okay. She feels "like things are trending down." She is more fatigued and her ice pica has returned. She has been having some chest palpitations.  She is going to pelvic floor therapy in August 2021. Her periods are normal. She is eating well. She denies any bleeding, but she bruises easily.  The patient received the Pfizer COVID-19 vaccine on 11/05/2019 and 11/26/2019.   Past Medical History:  Diagnosis Date  . Acid reflux   . Anemia   . Anxiety   . Asthma   . Chronic constipation   . Hirschsprung disease   . Pelvic floor dysfunction   . Syncope, cardiogenic     Past Surgical History:  Procedure Laterality Date  . abdomial adhesions removed  2012  . APPENDECTOMY  1997  . bladder repair surgery  1997  . c- sections  [id number]  . enodmetiosis removed  2012  . OVARIAN CYST REMOVAL  2003, 2012  . TONSILLECTOMY  2007    Family History  Problem Relation Age of Onset  . Cancer Mother   . Diabetes Mother   .  Hypertension Mother   . Cancer Father   . Cancer Maternal Grandfather   . Factor IX deficiency Maternal Grandfather   . Factor IX deficiency Son   . Kidney cancer Neg Hx   . Bladder Cancer Neg Hx     Social History:  reports that she has never smoked. She has never used smokeless tobacco. She reports that she does not drink alcohol and does not use drugs. She lives in [place]. The patient is alone today.  Allergies:  Allergies  Allergen Reactions  . Meperidine Anaphylaxis    Other reaction(s): Other (See Comments) Other Reaction: Not Assessed  . Lactose     Other reaction(s): Other (See Comments) Other Reaction: GI Upset  . Fludrocortisone Hives  . Levofloxacin Hives  . Sulfamethoxazole-Trimethoprim Other (See Comments)    Other reaction(s): Dizziness    Current Medications: Current Outpatient Medications  Medication Sig Dispense Refill  . albuterol (PROAIR HFA) 108 (90 Base) MCG/ACT inhaler Inhale 1 puff into the lungs every 6 (six) hours as needed for wheezing or shortness of breath.     . fluticasone (FLONASE) 50 MCG/ACT nasal spray SHAKE LQ AND U 2 SPRAYS IEN QD    . hydrOXYzine (ATARAX/VISTARIL) 25 MG tablet TK 1 T PO QD PRN    . midodrine (PROAMATINE) 10 MG tablet Take 10 mg by mouth daily.     . norethindrone-ethinyl estradiol (JUNEL 1/20) 1-20 MG-MCG tablet Take 1 tablet by mouth daily.    Marland Kitchen  omeprazole (PRILOSEC) 40 MG capsule 1 capsule by mouth daily  0  . Plecanatide 3 MG TABS Take 3 mg by mouth daily.     . Acidophilus Lactobacillus CAPS Take 1 tablet by mouth daily.  (Patient not taking: Reported on 02/23/2020)    . EPINEPHrine (EPIPEN 2-PAK) 0.3 mg/0.3 mL IJ SOAJ injection Inject 0.3 mg into the muscle as needed. Reported on 02/07/2016 (Patient not taking: Reported on 02/23/2020)    . ondansetron (ZOFRAN) 4 MG tablet Take 4 mg by mouth every 8 (eight) hours as needed for nausea.  (Patient not taking: Reported on 02/23/2020)    . ondansetron (ZOFRAN-ODT) 4 MG  disintegrating tablet Take by mouth. (Patient not taking: Reported on 02/23/2020)    . promethazine (PHENERGAN) 25 MG tablet Take 25 mg by mouth every 8 (eight) hours as needed for vomiting.  (Patient not taking: Reported on 02/23/2020)    . sertraline (ZOLOFT) 100 MG tablet Take 200 mg by mouth daily.     . Sodium Phosphates (RA ENEMA) 7-19 GM/118ML ENEM Place rectally as needed (constipation).  (Patient not taking: Reported on 02/23/2020)     No current facility-administered medications for this visit.    Review of Systems  Constitutional: Positive for malaise/fatigue (worsening). Negative for chills, diaphoresis, fever and weight loss.       Feels "like things are trending down".  HENT: Negative.  Negative for congestion, ear discharge, ear pain, hearing loss, nosebleeds, sinus pain, sore throat and tinnitus.   Eyes: Negative.  Negative for blurred vision and double vision.  Respiratory: Negative.  Negative for cough, hemoptysis, sputum production and shortness of breath.   Cardiovascular: Positive for palpitations. Negative for chest pain, orthopnea, leg swelling and PND.  Gastrointestinal: Negative for abdominal pain, blood in stool, constipation, diarrhea, heartburn, melena, nausea and vomiting.       Ice pica. Bowels are "terrible".  Genitourinary: Negative.  Negative for dysuria, frequency, hematuria and urgency.       Normal menstrual cycles.   Musculoskeletal: Negative.  Negative for back pain, falls, joint pain and myalgias.  Skin: Negative.  Negative for itching and rash.  Neurological: Negative.  Negative for dizziness, tremors, speech change, focal weakness, weakness and headaches.  Endo/Heme/Allergies: Bruises/bleeds easily.  Psychiatric/Behavioral: Negative.  Negative for depression and memory loss. The patient is not nervous/anxious and does not have insomnia.   All other systems reviewed and are negative.  Performance status (ECOG):  1  Blood pressure 116/72, pulse (!) 59,  temperature 98.2 F (36.8 C), temperature source Oral, resp. rate 16, weight 138 lb 7.2 oz (62.8 kg), SpO2 100 %.   Physical Exam Vitals and nursing note reviewed.  Constitutional:      General: She is not in acute distress.    Appearance: She is well-developed. She is not diaphoretic.  HENT:     Head: Normocephalic and atraumatic.     Comments: Long brown hair.    Mouth/Throat:     Mouth: Mucous membranes are moist.     Pharynx: Oropharynx is clear.  Eyes:     General: No scleral icterus.    Extraocular Movements: Extraocular movements intact.     Conjunctiva/sclera: Conjunctivae normal.     Pupils: Pupils are equal, round, and reactive to light.     Comments: Blue eyes.  Cardiovascular:     Rate and Rhythm: Normal rate and regular rhythm.     Heart sounds: Normal heart sounds. No murmur heard.   Pulmonary:  Effort: Pulmonary effort is normal. No respiratory distress.     Breath sounds: Normal breath sounds. No wheezing or rales.  Chest:     Chest wall: No tenderness.  Abdominal:     General: Bowel sounds are normal. There is no distension.     Palpations: Abdomen is soft. There is no mass.     Tenderness: There is no abdominal tenderness. There is no guarding or rebound.  Musculoskeletal:        General: No swelling or tenderness. Normal range of motion.     Cervical back: Normal range of motion and neck supple.  Lymphadenopathy:     Head:     Right side of head: No preauricular, posterior auricular or occipital adenopathy.     Left side of head: No preauricular, posterior auricular or occipital adenopathy.     Cervical: No cervical adenopathy.     Upper Body:     Right upper body: No supraclavicular or axillary adenopathy.     Left upper body: No supraclavicular or axillary adenopathy.     Lower Body: No right inguinal adenopathy. No left inguinal adenopathy.  Skin:    General: Skin is warm and dry.  Neurological:     Mental Status: She is alert and oriented to  person, place, and time.  Psychiatric:        Behavior: Behavior normal.        Thought Content: Thought content normal.        Judgment: Judgment normal.    Appointment on 02/23/2020  Component Date Value Ref Range Status  . Ferritin 02/23/2020 17  11 - 307 ng/mL Final   Performed at [facility], [address] [place], [place] [address]  . WBC 02/23/2020 6.4  4.0 - 10.5 K/uL Final  . RBC 02/23/2020 4.02  3.87 - 5.11 MIL/uL Final  . Hemoglobin 02/23/2020 12.5  12.0 - 15.0 g/dL Final  . HCT 44/03/474207/02/2020 37.5  36 - 46 % Final  . MCV 02/23/2020 93.3  80.0 - 100.0 fL Final  . MCH 02/23/2020 31.1  26.0 - 34.0 pg Final  . MCHC 02/23/2020 33.3  30.0 - 36.0 g/dL Final  . RDW 59/56/387507/02/2020 12.3  11.5 - 15.5 % Final  . Platelets 02/23/2020 213  150 - 400 K/uL Final  . nRBC 02/23/2020 0.0  0.0 - 0.2 % Final  . Neutrophils Relative % 02/23/2020 55  % Final  . Neutro Abs 02/23/2020 3.5  1.7 - 7.7 K/uL Final  . Lymphocytes Relative 02/23/2020 35  % Final  . Lymphs Abs 02/23/2020 2.2  0.7 - 4.0 K/uL Final  . Monocytes Relative 02/23/2020 8  % Final  . Monocytes Absolute 02/23/2020 0.5  0 - 1 K/uL Final  . Eosinophils Relative 02/23/2020 2  % Final  . Eosinophils Absolute 02/23/2020 0.1  0 - 0 K/uL Final  . Basophils Relative 02/23/2020 0  % Final  . Basophils Absolute 02/23/2020 0.0  0 - 0 K/uL Final  . Immature Granulocytes 02/23/2020 0  % Final  . Abs Immature Granulocytes 02/23/2020 0.01  0.00 - 0.07 K/uL Final   Performed at [facility], [address] [place], [place] [address]    Assessment:  Latasha [name] is a 34 y.o. female with chronic constipation andiron deficiency anemia. Ferritin was 3 on 10/04/2014 consistent with iron deficiency. She denies any melena or hematochezia. She has a history of heavy mensesfelt secondary to endometriosis. Menses is now light on BCP.  EGDwas normal in 08/2016. Colonoscopywas normal in 09/2015. She has a history  ofmicroscopic hematuria. CT hematuria work-up on 04/29/2017 was negative. She is followed by urology.  Work-upon 07/20/2016confirmed iron deficiency (ferritin 5). B12 and folate were normal.   She has received Venofer 600 mg (03/15/2015 - 04/19/2015), x2 (08/09/2015 - 08/16/2015), x1 (10/31/2015), x2 (05/08/2016 - 05/15/2016), x2 (03/05/2017 - 03/12/2017), x2 (06/04/2017 - 06/11/2017), x3 (12/24/2017 - 01/15/2018), x 2 (06/24/2018 - 07/01/2018), and x 2 (02/25/2019 - 03/04/2019). She receives Venoferif ferritin is <50 with symptoms.  Ferritinhas been followed: 5 on 03/08/2015, 28 on 03/29/2015, 64 on 04/26/2015, 9 on 07/26/2015, 40 on 08/23/2015, 35 on 09/20/2015 29 on 10/25/2015, 32 on 02/07/2016, 36 on 05/08/2016, 39 on 02/17/2017, 39 on 05/27/2017, 42 on 09/03/2017, 32 on 12/23/2017, 68 on 03/26/2018, 42 on 06/23/2018 102 on 09/24/2018, 32 on 12/22/2018, 20 on 02/24/2019, 70 on 08/25/2019, 36 on 11/24/2019, and 17 on 02/23/2020.  She has a son with factor IX deficiency. Another son is being evaluated for von Willebrand's disease. Work-up on 07/20/2016revealed a normal platelet count, PT, PTT, von Willebrand panel, and multimers.  She has a history of microscopic hematuria. CT hematuria workup on 04/29/2017 revealed no findings to account for the patient's microscopic hematuria. She is followed by urology. Repeat urinalysis revealed no microscopic hematuria.  The patient received the Pfizer COVID-19 vaccine on 11/05/2019 and 11/26/2019.  Symptomatically, she is fatigued.  Exam is stable.  Plan: 1.   Review labs from 02/23/2020. 2.   Iron deficiency             Hematocrit 37.5.  Hemoglobin 12.5.  MCV 93.3.             Ferritin 17.             Continue IV iron if ferritin < 30 and symptomatic.             She has become symptomatic.  Venofer 100 mg IV today and weekly x 2 (total 3).   Urine pregnancy test prior to infusions. 3.   RTC in 2 months for labs (CBC with diff,  ferritin). 4.   RTC in 4 months for MD assessment, labs (CBC with diff, ferritin- day before) and +/- Venofer.  I discussed the assessment and treatment plan with the patient.  The patient was provided an opportunity to ask questions and all were answered.  The patient agreed with the plan and demonstrated an understanding of the instructions.  The patient was advised to call back if the symptoms worsen or if the condition fails to improve as anticipated.   Rosey Bath, MD, PhD    02/24/2020, 1:42 PM  I, Danella Penton Tufford, am acting as Neurosurgeon for [employer]. Merlene Pulling, MD, PhD.  I, Vietta Bonifield C. Merlene Pulling, MD, have reviewed the above documentation for accuracy and completeness, and I agree with the above.

## 2020-02-24 ENCOUNTER — Encounter: Payer: Self-pay | Admitting: Hematology and Oncology

## 2020-02-24 ENCOUNTER — Inpatient Hospital Stay (HOSPITAL_BASED_OUTPATIENT_CLINIC_OR_DEPARTMENT_OTHER): Payer: Medicare Other | Admitting: Hematology and Oncology

## 2020-02-24 ENCOUNTER — Inpatient Hospital Stay: Payer: Medicare Other

## 2020-02-24 VITALS — BP 116/72 | HR 59 | Temp 98.2°F | Resp 16 | Wt 138.4 lb

## 2020-02-24 VITALS — BP 106/70 | HR 57 | Temp 97.9°F | Resp 16

## 2020-02-24 DIAGNOSIS — D508 Other iron deficiency anemias: Secondary | ICD-10-CM

## 2020-02-24 DIAGNOSIS — D509 Iron deficiency anemia, unspecified: Secondary | ICD-10-CM | POA: Diagnosis not present

## 2020-02-24 LAB — PREGNANCY, URINE: Preg Test, Ur: NEGATIVE

## 2020-02-24 MED ORDER — SODIUM CHLORIDE 0.9 % IV SOLN: 100.0000 mg | Freq: Once | INTRAVENOUS | Status: DC

## 2020-02-24 MED ORDER — IRON SUCROSE 20 MG/ML IV SOLN
100.0000 mg | Freq: Once | INTRAVENOUS | Status: AC
  Administered 2020-02-24: 100 mg via INTRAVENOUS

## 2020-02-24 MED ORDER — SODIUM CHLORIDE 0.9 % IV SOLN
Freq: Once | INTRAVENOUS | Status: AC
  Filled 2020-02-24: qty 250

## 2020-02-24 NOTE — Patient Instructions (Signed)

## 2020-03-02 ENCOUNTER — Inpatient Hospital Stay: Payer: Medicare Other

## 2020-03-09 ENCOUNTER — Inpatient Hospital Stay: Payer: Medicare Other

## 2020-03-09 ENCOUNTER — Other Ambulatory Visit: Payer: Self-pay

## 2020-03-09 ENCOUNTER — Other Ambulatory Visit: Payer: Self-pay | Admitting: *Deleted

## 2020-03-09 VITALS — BP 95/56 | HR 58 | Temp 97.0°F | Resp 18

## 2020-03-09 DIAGNOSIS — D509 Iron deficiency anemia, unspecified: Secondary | ICD-10-CM | POA: Diagnosis not present

## 2020-03-09 DIAGNOSIS — D508 Other iron deficiency anemias: Secondary | ICD-10-CM

## 2020-03-09 LAB — PREGNANCY, URINE: Preg Test, Ur: NEGATIVE

## 2020-03-09 MED ORDER — SODIUM CHLORIDE 0.9 % IV SOLN: 100.0000 mg | Freq: Once | INTRAVENOUS | Status: DC

## 2020-03-09 MED ORDER — IRON SUCROSE 20 MG/ML IV SOLN
100.0000 mg | Freq: Once | INTRAVENOUS | Status: AC
  Administered 2020-03-09: 100 mg via INTRAVENOUS
  Filled 2020-03-09: qty 5

## 2020-03-09 MED ORDER — SODIUM CHLORIDE 0.9 % IV SOLN
INTRAVENOUS | Status: DC
  Filled 2020-03-09: qty 250

## 2020-03-16 ENCOUNTER — Inpatient Hospital Stay: Payer: Medicare Other

## 2020-03-16 ENCOUNTER — Other Ambulatory Visit: Payer: Self-pay

## 2020-03-16 VITALS — BP 103/70 | HR 62 | Temp 96.7°F | Resp 16

## 2020-03-16 DIAGNOSIS — D509 Iron deficiency anemia, unspecified: Secondary | ICD-10-CM

## 2020-03-16 DIAGNOSIS — D508 Other iron deficiency anemias: Secondary | ICD-10-CM

## 2020-03-16 LAB — PREGNANCY, URINE: Preg Test, Ur: NEGATIVE

## 2020-03-16 MED ORDER — SODIUM CHLORIDE 0.9 % IV SOLN
Freq: Once | INTRAVENOUS | Status: AC
  Filled 2020-03-16: qty 250

## 2020-03-16 MED ORDER — IRON SUCROSE 20 MG/ML IV SOLN
100.0000 mg | Freq: Once | INTRAVENOUS | Status: AC
  Administered 2020-03-16: 100 mg via INTRAVENOUS
  Filled 2020-03-16: qty 5

## 2020-03-16 MED ORDER — SODIUM CHLORIDE 0.9 % IV SOLN: 100.0000 mg | Freq: Once | INTRAVENOUS | Status: DC

## 2020-04-20 ENCOUNTER — Inpatient Hospital Stay: Payer: Medicare Other

## 2020-05-04 ENCOUNTER — Other Ambulatory Visit: Payer: Self-pay

## 2020-05-04 ENCOUNTER — Inpatient Hospital Stay: Payer: Medicare Other | Attending: Hematology and Oncology

## 2020-05-04 DIAGNOSIS — D509 Iron deficiency anemia, unspecified: Secondary | ICD-10-CM | POA: Diagnosis not present

## 2020-05-04 DIAGNOSIS — D508 Other iron deficiency anemias: Secondary | ICD-10-CM

## 2020-05-04 LAB — CBC WITH DIFFERENTIAL/PLATELET
Abs Immature Granulocytes: 0.01 10*3/uL (ref 0.00–0.07)
Basophils Absolute: 0 10*3/uL (ref 0.0–0.1)
Basophils Relative: 1 %
Eosinophils Absolute: 0.2 10*3/uL (ref 0.0–0.5)
Eosinophils Relative: 3 %
HCT: 38.8 % (ref 36.0–46.0)
Hemoglobin: 13.1 g/dL (ref 12.0–15.0)
Immature Granulocytes: 0 %
Lymphocytes Relative: 36 %
Lymphs Abs: 1.9 10*3/uL (ref 0.7–4.0)
MCH: 32.3 pg (ref 26.0–34.0)
MCHC: 33.8 g/dL (ref 30.0–36.0)
MCV: 95.6 fL (ref 80.0–100.0)
Monocytes Absolute: 0.4 10*3/uL (ref 0.1–1.0)
Monocytes Relative: 8 %
Neutro Abs: 2.9 10*3/uL (ref 1.7–7.7)
Neutrophils Relative %: 52 %
Platelets: 238 10*3/uL (ref 150–400)
RBC: 4.06 MIL/uL (ref 3.87–5.11)
RDW: 11.9 % (ref 11.5–15.5)
WBC: 5.4 10*3/uL (ref 4.0–10.5)
nRBC: 0 % (ref 0.0–0.2)

## 2020-05-04 LAB — FERRITIN: Ferritin: 76 ng/mL (ref 11–307)

## 2020-06-27 ENCOUNTER — Other Ambulatory Visit: Payer: Self-pay

## 2020-06-27 ENCOUNTER — Inpatient Hospital Stay: Payer: Medicare Other | Attending: Hematology and Oncology

## 2020-06-27 DIAGNOSIS — D509 Iron deficiency anemia, unspecified: Secondary | ICD-10-CM | POA: Diagnosis not present

## 2020-06-27 DIAGNOSIS — D508 Other iron deficiency anemias: Secondary | ICD-10-CM

## 2020-06-27 LAB — CBC WITH DIFFERENTIAL/PLATELET
Abs Immature Granulocytes: 0.02 10*3/uL (ref 0.00–0.07)
Basophils Absolute: 0 10*3/uL (ref 0.0–0.1)
Basophils Relative: 1 %
Eosinophils Absolute: 0.2 10*3/uL (ref 0.0–0.5)
Eosinophils Relative: 3 %
HCT: 36.9 % (ref 36.0–46.0)
Hemoglobin: 12.2 g/dL (ref 12.0–15.0)
Immature Granulocytes: 0 %
Lymphocytes Relative: 24 %
Lymphs Abs: 2.2 10*3/uL (ref 0.7–4.0)
MCH: 31.4 pg (ref 26.0–34.0)
MCHC: 33.1 g/dL (ref 30.0–36.0)
MCV: 95.1 fL (ref 80.0–100.0)
Monocytes Absolute: 0.6 10*3/uL (ref 0.1–1.0)
Monocytes Relative: 7 %
Neutro Abs: 5.8 10*3/uL (ref 1.7–7.7)
Neutrophils Relative %: 65 %
Platelets: 215 10*3/uL (ref 150–400)
RBC: 3.88 MIL/uL (ref 3.87–5.11)
RDW: 12.1 % (ref 11.5–15.5)
WBC: 8.8 10*3/uL (ref 4.0–10.5)
nRBC: 0 % (ref 0.0–0.2)

## 2020-06-28 ENCOUNTER — Ambulatory Visit: Payer: Medicare Other

## 2020-06-28 ENCOUNTER — Inpatient Hospital Stay: Payer: Medicare Other | Admitting: Hematology and Oncology

## 2020-06-28 LAB — FERRITIN: Ferritin: 109 ng/mL (ref 11–307)

## 2020-06-29 NOTE — Progress Notes (Signed)
[facility]  [address], [address] [place], [place] [address] Phone: [phone]  Fax: [phone]   Clinic Day:  07/03/2020  Referring physician: Rayetta HumphreyGeorge, Sionne A, MD  Chief Complaint: Latasha [name] is a 34 y.o. 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  HPI: The patient was last seen in the hematology clinic on 02/24/2020. At that time, she was fatigued.  Exam was stable. Hematocrit was 37.5, hemoglobin 12.5, platelets 213,000, WBC 6,400. Ferritin was 17. She received Venofer.  She received Venofer weekly x 3 (02/24/2020 - 03/16/2020).  Labs followed: 05/04/2020: Hematocrit 38.8, hemoglobin 13.1, platelets 238,000, WBC 5,400. Ferritin 76. 06/27/2020: Hematocrit 36.9, hemoglobin 12.2, platelets 215,000, WBC 8,800. Ferritin 109.  During the interim, she has been "good." She states that her last iron infusion brought her back to normal in terms of her energy. She has been eating iron rich foods. Her bowels are still "terrible."   She still gets palpitations occasionally and her heart rate drops into the 40s. [facility] is [facility]. There was talk about a pacemaker for a while but they were hesitant because of her age. She states that she "faints all the time."  She recently stopped taking birth control and has noticed that her periods last longer and are heavier.   Past Medical History:  Diagnosis Date  . Acid reflux   . Anemia   . Anxiety   . Asthma   . Chronic constipation   . Hirschsprung disease   . Pelvic floor dysfunction   . Syncope, cardiogenic     Past Surgical History:  Procedure Laterality Date  . abdomial adhesions removed  2012  . APPENDECTOMY  1997  . bladder repair surgery  1997  . c- sections  [id number]  . enodmetiosis removed  2012  . OVARIAN CYST REMOVAL  2003, 2012  . TONSILLECTOMY  2007    Family History  Problem Relation Age of Onset  . Cancer  Mother   . Diabetes Mother   . Hypertension Mother   . Cancer Father   . Cancer Maternal Grandfather   . Factor IX deficiency Maternal Grandfather   . Factor IX deficiency Son   . Kidney cancer Neg Hx   . Bladder Cancer Neg Hx     Social History:  reports that she has never smoked. She has never used smokeless tobacco. She reports that she does not drink alcohol and does not use drugs. She [place] in [place]. The patient is alone today.  Allergies:  Allergies  Allergen Reactions  . Meperidine Anaphylaxis    Other reaction(s): Other (See Comments) Other Reaction: Not Assessed  . Lactose     Other reaction(s): Other (See Comments) Other Reaction: GI Upset  . Fludrocortisone Hives  . Levofloxacin Hives  . Sulfamethoxazole-Trimethoprim Other (See Comments)    Other reaction(s): Dizziness    Current Medications: Current Outpatient Medications  Medication Sig Dispense Refill  . albuterol (PROAIR HFA) 108 (90 Base) MCG/ACT inhaler Inhale 1 puff into the lungs every 6 (six) hours as needed for wheezing or shortness of breath.     . fluticasone (FLONASE) 50 MCG/ACT nasal spray SHAKE LQ AND U 2 SPRAYS IEN QD    . hydrOXYzine (ATARAX/VISTARIL) 25 MG tablet TK 1 T PO QD PRN    . midodrine (PROAMATINE) 10 MG tablet Take 10 mg by mouth daily.     . norethindrone-ethinyl estradiol (JUNEL 1/20) 1-20 MG-MCG tablet  Take 1 tablet by mouth daily.    Marland Kitchen omeprazole (PRILOSEC) 40 MG capsule 1 capsule by mouth daily  0  . Plecanatide 3 MG TABS Take 3 mg by mouth daily.     . sertraline (ZOLOFT) 100 MG tablet Take 200 mg by mouth daily.     . Acidophilus Lactobacillus CAPS Take 1 tablet by mouth daily.  (Patient not taking: Reported on 02/23/2020)    . EPINEPHrine (EPIPEN 2-PAK) 0.3 mg/0.3 mL IJ SOAJ injection Inject 0.3 mg into the muscle as needed. Reported on 02/07/2016 (Patient not taking: Reported on 02/23/2020)    . ondansetron (ZOFRAN) 4 MG tablet Take 4 mg by mouth every 8 (eight) hours as needed  for nausea.  (Patient not taking: Reported on 02/23/2020)    . ondansetron (ZOFRAN-ODT) 4 MG disintegrating tablet Take by mouth. (Patient not taking: Reported on 02/23/2020)    . promethazine (PHENERGAN) 25 MG tablet Take 25 mg by mouth every 8 (eight) hours as needed for vomiting.  (Patient not taking: Reported on 02/23/2020)    . Sodium Phosphates (RA ENEMA) 7-19 GM/118ML ENEM Place rectally as needed (constipation).  (Patient not taking: Reported on 02/23/2020)     No current facility-administered medications for this visit.    Review of Systems  Constitutional: Negative for chills, diaphoresis, fever, malaise/fatigue and weight loss.       Feels "good."  HENT: Negative.  Negative for congestion, ear discharge, ear pain, hearing loss, nosebleeds, sinus pain, sore throat and tinnitus.   Eyes: Negative.  Negative for blurred vision and double vision.  Respiratory: Negative.  Negative for cough, hemoptysis, sputum production and shortness of breath.   Cardiovascular: Positive for palpitations. Negative for chest pain, orthopnea and leg swelling.  Gastrointestinal: Negative for abdominal pain, blood in stool, constipation, diarrhea, heartburn, melena, nausea and vomiting.       Bowels are "terrible". Eating iron rich foods.  Genitourinary: Negative.  Negative for dysuria, frequency, hematuria and urgency.  Musculoskeletal: Negative.  Negative for back pain, joint pain, myalgias and neck pain.  Skin: Negative.  Negative for itching and rash.  Neurological: Negative.  Negative for dizziness, tingling, speech change, weakness and headaches.  Endo/Heme/Allergies: Does not bruise/bleed easily.  Psychiatric/Behavioral: Negative.  Negative for depression and memory loss. The patient is not nervous/anxious and does not have insomnia.   All other systems reviewed and are negative.  Performance status (ECOG):  0-1  Vital signs Blood pressure 116/75, pulse 72, temperature 98.6 F (37 C), temperature source  Tympanic, resp. rate 18, weight 141 lb 10.3 oz (64.3 kg), SpO2 100 %.  Physical Exam Vitals and nursing note reviewed.  Constitutional:      General: She is not in acute distress.    Appearance: She is well-developed. She is not diaphoretic.  HENT:     Head: Normocephalic and atraumatic.     Comments: Long brown hair.    Mouth/Throat:     Mouth: Mucous membranes are moist.     Pharynx: Oropharynx is clear.  Eyes:     General: No scleral icterus.    Extraocular Movements: Extraocular movements intact.     Conjunctiva/sclera: Conjunctivae normal.     Pupils: Pupils are equal, round, and reactive to light.     Comments: Blue eyes.  Cardiovascular:     Rate and Rhythm: Normal rate and regular rhythm.     Heart sounds: Normal heart sounds. No murmur heard.   Pulmonary:     Effort: Pulmonary effort is normal.  No respiratory distress.     Breath sounds: Normal breath sounds. No wheezing or rales.  Chest:     Chest wall: No tenderness.  Abdominal:     General: Bowel sounds are normal. There is no distension.     Palpations: Abdomen is soft. There is no mass.     Tenderness: There is no abdominal tenderness. There is no guarding or rebound.  Musculoskeletal:        General: No swelling or tenderness. Normal range of motion.     Cervical back: Normal range of motion and neck supple.  Lymphadenopathy:     Head:     Right side of head: No preauricular, posterior auricular or occipital adenopathy.     Left side of head: No preauricular, posterior auricular or occipital adenopathy.     Cervical: No cervical adenopathy.     Upper Body:     Right upper body: No supraclavicular or axillary adenopathy.     Left upper body: No supraclavicular or axillary adenopathy.     Lower Body: No right inguinal adenopathy. No left inguinal adenopathy.  Skin:    General: Skin is warm and dry.  Neurological:     Mental Status: She is alert and oriented to person, place, and time.  Psychiatric:         Behavior: Behavior normal.        Thought Content: Thought content normal.        Judgment: Judgment normal.    No visits with results within 3 Day(s) from this visit.  Latest known visit with results is:  Appointment on 06/27/2020  Component Date Value Ref Range Status  . Ferritin 06/27/2020 109  11 - 307 ng/mL Final   Performed at [facility], [address] [place] [place], [place] [address]  . WBC 06/27/2020 8.8  4.0 - 10.5 K/uL Final  . RBC 06/27/2020 3.88  3.87 - 5.11 MIL/uL Final  . Hemoglobin 06/27/2020 12.2  12.0 - 15.0 g/dL Final  . HCT 25/42/7062 36.9  36 - 46 % Final  . MCV 06/27/2020 95.1  80.0 - 100.0 fL Final  . MCH 06/27/2020 31.4  26.0 - 34.0 pg Final  . MCHC 06/27/2020 33.1  30.0 - 36.0 g/dL Final  . RDW 37/62/8315 12.1  11.5 - 15.5 % Final  . Platelets 06/27/2020 215  150 - 400 K/uL Final  . nRBC 06/27/2020 0.0  0.0 - 0.2 % Final  . Neutrophils Relative % 06/27/2020 65  % Final  . Neutro Abs 06/27/2020 5.8  1.7 - 7.7 K/uL Final  . Lymphocytes Relative 06/27/2020 24  % Final  . Lymphs Abs 06/27/2020 2.2  0.7 - 4.0 K/uL Final  . Monocytes Relative 06/27/2020 7  % Final  . Monocytes Absolute 06/27/2020 0.6  0.1 - 1.0 K/uL Final  . Eosinophils Relative 06/27/2020 3  % Final  . Eosinophils Absolute 06/27/2020 0.2  0.0 - 0.5 K/uL Final  . Basophils Relative 06/27/2020 1  % Final  . Basophils Absolute 06/27/2020 0.0  0.0 - 0.1 K/uL Final  . Immature Granulocytes 06/27/2020 0  % Final  . Abs Immature Granulocytes 06/27/2020 0.02  0.00 - 0.07 K/uL Final   Performed at [facility], [address] [place], [place] [address]    Assessment:  Latasha [name] is a 34 y.o. female with chronic constipation andiron deficiency anemia. Ferritin was 3 on 10/04/2014 consistent with iron deficiency. She denies any melena or hematochezia. She has a history  of heavy mensesfelt secondary to endometriosis. Menses is now light on BCP.  EGDwas normal in  08/2016. Colonoscopywas normal in 09/2015. She has a history ofmicroscopic hematuria. CT hematuria work-up on 04/29/2017 was negative. She is followed by urology.  Work-upon 07/20/2016confirmed iron deficiency (ferritin 5). B12 and folate were normal.   She has received Venofer 600 mg (03/15/2015 - 04/19/2015), x2 (08/09/2015 - 08/16/2015), x1 (10/31/2015), x2 (05/08/2016 - 05/15/2016), x2 (03/05/2017 - 03/12/2017), x2 (06/04/2017 - 06/11/2017), x3 (12/24/2017 - 01/15/2018), x 2 (06/24/2018 - 07/01/2018), x 2 (02/25/2019 - 03/04/2019), and x 3 (02/24/2020 - 03/16/2020). She receives Venoferif ferritin is <50 with symptoms.  Ferritinhas been followed: 5 on 03/08/2015, 28 on 03/29/2015, 64 on 04/26/2015, 9 on 07/26/2015, 40 on 08/23/2015, 35 on 09/20/2015 29 on 10/25/2015, 32 on 02/07/2016, 36 on 05/08/2016, 39 on 02/17/2017, 39 on 05/27/2017, 42 on 09/03/2017, 32 on 12/23/2017, 68 on 03/26/2018, 42 on 06/23/2018 102 on 09/24/2018, 32 on 12/22/2018, 20 on 02/24/2019, 70 on 08/25/2019, 36 on 11/24/2019, 17 on 02/23/2020, 76 on 05/04/2020, and 109 on 06/27/2020.  She has a son with factor IX deficiency. Another son is being evaluated for von Willebrand's disease. Work-up on 07/20/2016revealed a normal platelet count, PT, PTT, von Willebrand panel, and multimers.  She has a history of microscopic hematuria. CT hematuria workup on 04/29/2017 revealed no findings to account for the patient's microscopic hematuria. She is followed by urology. Repeat urinalysis revealed no microscopic hematuria.  The patient received the Pfizer COVID-19 vaccine on 11/05/2019 and 11/26/2019. She received the Booster shot in 06/19/2020.  Symptomatically, she feels "good." She has been eating iron rich foods. Her bowels are still "terrible."  She recently stopped taking birth control and has noticed that her periods last longer and are heavier.  Exam is stable.  Plan: 1.   Review labs from 06/27/2020. 2.    Iron deficiency             Hematocrit 36.9.  Hemoglobin 12.2.  MCV 95.1.             Ferritin 109.             Patient received IV iron if ferritin < 30.             Patient may require more frequent IV iron if menses significant.   Patient to contact clinic if symptomatic.  Continue to monitor. 3.   RTC in 3 months for labs (CBC with diff, ferritin). 4.   RTC in 6 months for MD assessment, labs (CBC with diff, ferritin- day before) and +/- Venofer.  I discussed the assessment and treatment plan with the patient.  The patient was provided an opportunity to ask questions and all were answered.  The patient agreed with the plan and demonstrated an understanding of the instructions.  The patient was advised to call back if the symptoms worsen or if the condition fails to improve as anticipated.   Rosey Bath, MD, PhD    07/03/2020, 3:12 PM  I, Danella Penton Tufford, am acting as Neurosurgeon for [employer]. Merlene Pulling, MD, PhD.  I, Ronny Ruddell C. Merlene Pulling, MD, have reviewed the above documentation for accuracy and completeness, and I agree with the above.

## 2020-07-03 ENCOUNTER — Inpatient Hospital Stay (HOSPITAL_BASED_OUTPATIENT_CLINIC_OR_DEPARTMENT_OTHER): Payer: Medicare Other | Admitting: Hematology and Oncology

## 2020-07-03 ENCOUNTER — Other Ambulatory Visit: Payer: Self-pay

## 2020-07-03 ENCOUNTER — Inpatient Hospital Stay: Payer: Medicare Other

## 2020-07-03 VITALS — BP 116/75 | HR 72 | Temp 98.6°F | Resp 18 | Wt 141.6 lb

## 2020-07-03 DIAGNOSIS — D508 Other iron deficiency anemias: Secondary | ICD-10-CM

## 2020-07-03 DIAGNOSIS — D509 Iron deficiency anemia, unspecified: Secondary | ICD-10-CM | POA: Diagnosis not present

## 2020-07-03 NOTE — Progress Notes (Signed)
No new changes noted today 

## 2020-08-31 ENCOUNTER — Other Ambulatory Visit: Payer: Medicare Other

## 2020-08-31 DIAGNOSIS — Z20822 Contact with and (suspected) exposure to covid-19: Secondary | ICD-10-CM

## 2020-09-02 LAB — NOVEL CORONAVIRUS, NAA: SARS-CoV-2, NAA: DETECTED — AB

## 2020-09-02 LAB — SARS-COV-2, NAA 2 DAY TAT

## 2020-09-26 ENCOUNTER — Telehealth (INDEPENDENT_AMBULATORY_CARE_PROVIDER_SITE_OTHER): Payer: Medicare Other | Admitting: Psychiatry

## 2020-09-26 ENCOUNTER — Other Ambulatory Visit: Payer: Self-pay

## 2020-09-26 ENCOUNTER — Encounter: Payer: Self-pay | Admitting: Psychiatry

## 2020-09-26 ENCOUNTER — Telehealth: Payer: Self-pay

## 2020-09-26 DIAGNOSIS — F411 Generalized anxiety disorder: Secondary | ICD-10-CM | POA: Diagnosis not present

## 2020-09-26 DIAGNOSIS — N390 Urinary tract infection, site not specified: Secondary | ICD-10-CM | POA: Insufficient documentation

## 2020-09-26 DIAGNOSIS — F4310 Post-traumatic stress disorder, unspecified: Secondary | ICD-10-CM | POA: Insufficient documentation

## 2020-09-26 DIAGNOSIS — F410 Panic disorder [episodic paroxysmal anxiety] without agoraphobia: Secondary | ICD-10-CM | POA: Insufficient documentation

## 2020-09-26 DIAGNOSIS — Z8659 Personal history of other mental and behavioral disorders: Secondary | ICD-10-CM

## 2020-09-26 DIAGNOSIS — N809 Endometriosis, unspecified: Secondary | ICD-10-CM | POA: Insufficient documentation

## 2020-09-26 DIAGNOSIS — F331 Major depressive disorder, recurrent, moderate: Secondary | ICD-10-CM | POA: Diagnosis not present

## 2020-09-26 MED ORDER — BUSPIRONE HCL 10 MG PO TABS: 10.0000 mg | ORAL_TABLET | Freq: Two times a day (BID) | ORAL | 1 refills | Status: DC

## 2020-09-26 MED ORDER — SERTRALINE HCL 100 MG PO TABS: 150.0000 mg | ORAL_TABLET | Freq: Every day | ORAL | 1 refills | Status: DC

## 2020-09-26 NOTE — Progress Notes (Signed)
Virtual Visit via Video Note  I connected with [name] on 09/26/20 at  9:00 AM EST by a video enabled telemedicine application and verified that I am speaking with the correct person using two identifiers.  Location Provider Location : [facility] Patient Location : Home  Participants: Patient , Provider    I discussed the limitations of evaluation and management by telemedicine and the availability of in person appointments. The patient expressed understanding and agreed to proceed.  I discussed the assessment and treatment plan with the patient. The patient was provided an opportunity to ask questions and all were answered. The patient agreed with the plan and demonstrated an understanding of the instructions.   The patient was advised to call back or seek an in-person evaluation if the symptoms worsen or if the condition fails to improve as anticipated.    Psychiatric Initial Adult Assessment   Patient Identification: Latasha [name] MRN:  [id number] Date of Evaluation:  09/26/2020 Referral Source: Angus Palms MD Chief Complaint:   Chief Complaint    Establish Care     Visit Diagnosis: R/O OCD   ICD-10-CM   1. GAD (generalized anxiety disorder)  F41.1 sertraline (ZOLOFT) 100 MG tablet    busPIRone (BUSPAR) 10 MG tablet    TSH  2. MDD (major depressive disorder), recurrent episode, moderate (HCC)  F33.1 sertraline (ZOLOFT) 100 MG tablet    busPIRone (BUSPAR) 10 MG tablet    TSH  3. PTSD (post-traumatic stress disorder)  F43.10 sertraline (ZOLOFT) 100 MG tablet    busPIRone (BUSPAR) 10 MG tablet  4. Panic attacks  F41.0 sertraline (ZOLOFT) 100 MG tablet    busPIRone (BUSPAR) 10 MG tablet    TSH  5. History of anorexia nervosa  Z86.59     History of Present Illness:  Latasha [name] is a 35 year old Caucasian female, single, on SSD, lives at [place] with her children and parents, has a history of depression, PTSD, panic attacks, anorexia nervosa, Hirschsprung's  disease, iron deficiency anemia, chronic constipation, vasovagal syncope was evaluated by telemedicine today.  Patient was under the care of psychiatrist at [facility] previously.  Patient reports she had trouble with scheduling appointments and hence her last appointment with him was 8 months ago.  Patient currently reports she is struggling with anxiety symptoms.  She is a Product/process development scientist, worries about everything to the extreme of the time especially she worries constantly about her children and those around her getting sick.  She reports she struggles with multiple health problems including chronic constipation, iron deficiency anemia vasovagal syncope which does limit her ability to work or go out of her house.  She reports her sons who are 9 and 6, also struggles with problems.  Her 5-year-old son has cyclical vomiting and her 49-year-old son has autism and ADHD.  She reports she constantly making them wash hands, sanitizer belongings when they come back from school and so on.  She reports she spends several hours cleaning the house, get her clothes and sanitizing stuff to make sure everybody is safe at home and does not contract any illness.  The COVID-19 pandemic has made her anxiety worse since she is constantly worried.  She does report she constantly obsesses over cleaning however reports she has never been diagnosed with OCD.  She is currently on sertraline, has been on it since the past 17 to 19 years.  She does not feel the sertraline is beneficial anymore.  Patient reports a history of trauma.  She  reports at the age of 8 years she underwent bladder surgery, developed complications from it, she had to go back in for repeat surgery to repair again.  She reports she spent 3 weeks in the hospital and was pretty sick the entire time.  She reports that at her sickest point at the hospital her mother left her to go home and take a shower and that was traumatic for her since she felt extremely sick after her mother  left and when her mother returned they were about to transfer her for higher level of care to another hospital.  Patient reports she started getting flashbacks of her hospitalization ever since then.  Certain smell, certain incidents, can trigger her episode of flashbacks, rumination and intrusive memories.  She reports she also gets nightmares however they are not very common right now and very sporadic.  It does not affect her sleep anymore.  Patient does report panic attacks when she has flashbacks about her hospital stay as a child.  She continues to struggle with panic attacks couple of times a week or so.  She reports however anything can trigger her panic attack including her constipation, her pain and so on.  She describes her panic attacks as having nausea, feeling hot, feeling nervous, racing heart rate as well as she faints.  She was diagnosed with vasovagal syncope in the past.  Patient reports she currently takes hydroxyzine as needed when she has these panic attacks which helps to some extent since it helps her to relax and sleep.  She has been in therapy in the past however reports her therapy sessions is not very beneficial.  Patient reports a history of depression.  She has been struggling with depression since the past 18 to 19 years or so.  She describes sadness, lack of motivation, concentration problems, low energy and so on a regular basis.  She reports she continues to be depressed now in spite of taking the Zoloft, however her depressive symptoms may be more manageable on the Zoloft.  She denies any suicidality, homicidality or perceptual disturbances.  Patient does report a history of eating disorder.  She reports she grew up with an alcoholic father who was verbally abusive.  She reports her father blamed her sickness for his alcoholism.  Patient reports she developed anorexia at the age of 11 years, it was her way of getting some control over the situation.  She reports she started  taking only 300 cal/day and underwent treatment at [facility] eating disorder clinic.  She reports she was in treatment for 3 to 4 years and she was able to have better control of her eating disorder at the end of her treatment.  She tried medications like Paxil, Prozac and BuSpar at that time however does not clearly remember how long she stayed on any of these medications.  She currently denies any significant eating disorder problems.  She denies any history of binge eating or bulimic episodes.  Patient denies any sleep problems even though she has nightmares on and off.  Patient does report concentration problems.  She reports she is unable to complete task, unable to stay on task.  She does not remember being diagnosed with ADHD as a child.  Patient also does not remember having problems at school.  She however dropped out of college since she was sick physically and had fainting episodes and hence could not complete.  Patient denies any manic or hypomanic episodes.  Patient does report a history of  self-injurious behaviors, cutting herself at the age of 13 years to 15 years.  She currently denies any suicidality, homicidality or perceptual disturbances.  Patient denies any substance abuse problems.   Patient with history of multiple medical problems currently undergoes pelvic muscle training every other week.  She is also under the care of her providers for management of iron deficiency, chronic constipation.        Associated Signs/Symptoms: Depression Symptoms:  depressed mood, anhedonia, fatigue, difficulty concentrating, anxiety, panic attacks, loss of energy/fatigue, (Hypo) Manic Symptoms:  Denies Anxiety Symptoms:  Excessive Worry, Panic Symptoms, Obsessive Compulsive Symptoms:   Handwashing,, Psychotic Symptoms:  Denies PTSD Symptoms: Had a traumatic exposure:  as noted above Re-experiencing:  Flashbacks Intrusive Thoughts Nightmares Hypervigilance:  Yes Hyperarousal:   Difficulty Concentrating Irritability/Anger Avoidance:  Foreshortened Future  Past Psychiatric History: Patient was under the care of due to eating disorder clinic at [facility] as a child from the age of [age].  She does report a history of self-injurious behavior of cutting self from the age of [age].  Patient denies any suicide attempts.  Patient denies any inpatient mental health admissions.  Most recently she was under the care of [facility] , medication management and psychotherapy sessions however reports last visit was 8 months ago.  Previous Psychotropic Medications: Yes Zoloft, BuSpar, Prozac, Wellbutrin, Paxil, Xanax  Substance Abuse History in the last 12 months:  No.  Consequences of Substance Abuse: Negative  Past Medical History:  Past Medical History:  Diagnosis Date  . Acid reflux   . Anemia   . Anxiety   . Asthma   . Chronic constipation   . Hirschsprung disease   . Pelvic floor dysfunction   . Syncope, cardiogenic     Past Surgical History:  Procedure Laterality Date  . abdomial adhesions removed  2012  . APPENDECTOMY  1997  . bladder repair surgery  1997  . c- sections  [id number]  . enodmetiosis removed  2012  . OVARIAN CYST REMOVAL  2003, 2012  . TONSILLECTOMY  2007    Family Psychiatric History: Father-alcoholism, depression , 85-year-old son-autism, ADHD. Denies history of suicide in her family.  Family History:  Family History  Problem Relation Age of Onset  . Cancer Mother   . Diabetes Mother   . Hypertension Mother   . Cancer Father   . Alcohol abuse Father   . Depression Father   . Cancer Maternal Grandfather   . Factor IX deficiency Maternal Grandfather   . Factor IX deficiency Son   . ADD / ADHD Son   . Autism Son   . Kidney cancer Neg Hx   . Bladder Cancer Neg Hx     Social History:   Social History   Socioeconomic History  . Marital status: Single    Spouse name: Not on file  . Number of children: 2  . Years of education: 12 th grade,  some college  . Highest education level: Not on file  Occupational History  . Occupation: disabled  Tobacco Use  . Smoking status: Never Smoker  . Smokeless tobacco: Never Used  Substance and Sexual Activity  . Alcohol use: No  . Drug use: No  . Sexual activity: Not on file  Other Topics Concern  . Not on file  Social History Narrative  . Not on file   Social Determinants of Health   Financial Resource Strain: Not on file  Food Insecurity: Not on file  Transportation Needs: Not on file  Physical  Activity: Not on file  Stress: Not on file  Social Connections: Not on file    Additional Social History: Patient was raised by both parents.  She reports she had a difficult childhood due to her own health problems as well as her father being an alcoholic.  Patient has an older sister and reports an okay relationship with her.  Patient graduated high school, did some college.  She currently lives at [facility] with her parents and her 2 children, 51 and 64-year-old sons.  Patient reports good support system from her parents as well as the father of her children.  Patient is single.  She is on disability.  She denies legal problems.  Allergies:   Allergies  Allergen Reactions  . Meperidine Anaphylaxis    Other reaction(s): Other (See Comments) Other Reaction: Not Assessed  . Lactose     Other reaction(s): Other (See Comments) Other Reaction: GI Upset  . Fludrocortisone Hives  . Levofloxacin Hives  . Sulfamethoxazole-Trimethoprim Other (See Comments)    Other reaction(s): Dizziness    Metabolic Disorder Labs: No results found for: HGBA1C, MPG No results found for: PROLACTIN No results found for: CHOL, TRIG, HDL, CHOLHDL, VLDL, LDLCALC No results found for: TSH  Therapeutic Level Labs: No results found for: LITHIUM No results found for: CBMZ No results found for: VALPROATE  Current Medications: Current Outpatient Medications  Medication Sig Dispense Refill  . Acidophilus  Lactobacillus CAPS Take 1 tablet by mouth daily.    Marland Kitchen albuterol (VENTOLIN HFA) 108 (90 Base) MCG/ACT inhaler Inhale 1 puff into the lungs every 6 (six) hours as needed for wheezing or shortness of breath.     . busPIRone (BUSPAR) 10 MG tablet Take 1 tablet (10 mg total) by mouth 2 (two) times daily. 60 tablet 1  . EPINEPHrine (EPIPEN 2-PAK) 0.3 mg/0.3 mL IJ SOAJ injection Inject 0.3 mg into the muscle as needed. Reported on 02/07/2016    . fluticasone (FLONASE) 50 MCG/ACT nasal spray SHAKE LQ AND U 2 SPRAYS IEN QD    . hydrOXYzine (ATARAX/VISTARIL) 25 MG tablet TK 1 T PO QD PRN    . midodrine (PROAMATINE) 10 MG tablet Take 10 mg by mouth daily.     . norethindrone-ethinyl estradiol (LOESTRIN) 1-20 MG-MCG tablet Take 1 tablet by mouth daily.    . ondansetron (ZOFRAN) 4 MG tablet Take 4 mg by mouth every 8 (eight) hours as needed for nausea.    . promethazine (PHENERGAN) 25 MG tablet Take 25 mg by mouth every 8 (eight) hours as needed for vomiting.    . Sodium Phosphates (RA ENEMA) 7-19 GM/118ML ENEM Place rectally as needed (constipation).    Marland Kitchen omeprazole (PRILOSEC) 40 MG capsule 1 capsule by mouth daily (Patient not taking: Reported on 09/26/2020)  0  . ondansetron (ZOFRAN-ODT) 4 MG disintegrating tablet Take by mouth. (Patient not taking: No sig reported)    . Plecanatide 3 MG TABS Take 3 mg by mouth daily.  (Patient not taking: Reported on 09/26/2020)    . sertraline (ZOLOFT) 100 MG tablet Take 1.5 tablets (150 mg total) by mouth daily. 45 tablet 1   No current facility-administered medications for this visit.    Musculoskeletal: Strength & Muscle Tone: UTA Gait & Station: UTA Patient leans: N/A  Psychiatric Specialty Exam: Review of Systems  Constitutional: Positive for fatigue.  Psychiatric/Behavioral: Positive for decreased concentration and dysphoric mood. The patient is nervous/anxious.   All other systems reviewed and are negative.   There were no  vitals taken for this visit.There is  no height or weight on file to calculate BMI.  General Appearance: Casual  Eye Contact:  Fair  Speech:  Clear and Coherent  Volume:  Normal  Mood:  Anxious, Depressed and Dysphoric  Affect:  Congruent  Thought Process:  Goal Directed and Descriptions of Associations: Intact  Orientation:  Full (Time, Place, and Person)  Thought Content:  Obsessions and Rumination  Suicidal Thoughts:  No  Homicidal Thoughts:  No  Memory:  Immediate;   Fair Recent;   Fair Remote;   Fair  Judgement:  Fair  Insight:  Fair  Psychomotor Activity:  Normal  Concentration:  Concentration: Fair and Attention Span: Fair  Recall:  [employer] of Knowledge:Fair  Language: Fair  Akathisia:  No  Handed:  Right  AIMS (if indicated):  UTA  Assets:  Communication Skills Desire for Improvement Housing Social Support  ADL's:  Intact  Cognition: WNL  Sleep:  Fair   Screenings: GAD-7   Flowsheet Row Video Visit from 09/26/2020 in [facility]  Total GAD-7 Score 13    PHQ2-9   Flowsheet Row Video Visit from 09/26/2020 in [facility]  PHQ-2 Total Score 5  PHQ-9 Total Score 14      Assessment and Plan: LENETTE TERLIZZI is a 35 year old Caucasian female, on disability, single, lives at [facility], has a history of depression, anxiety, eating disorder, PTSD, panic attacks, Hirschsprung's disease, chronic constipation, iron deficiency, was evaluated by telemedicine today.  Patient is biologically predisposed given her multiple health problems, history of trauma, history of mental health problems in her family.  Patient with psychosocial stressors of being a single mother, children with health problems, her own health issues.  Continues to struggle with depression, anxiety and will benefit from the following plan.  Plan MDD-unstable PHQ-9 equals 14 Reduce Zoloft to 150 mg p.o. daily.  Will monitor patient while reducing the dosage since she has a history of  withdrawal symptoms when she tried to taper the Zoloft down in the past. Start BuSpar 10 mg p.o. twice daily Refer for psychotherapy session.  GAD-unstable GAD 7 equals 13 Continue Zoloft as noted above. Start BuSpar 10 mg p.o. twice daily Refer for CBT  PTSD-unstable Refer for CBT. Discussed referral for EMDR, she will discuss with her therapist. Continue Zoloft as noted above.  Panic attacks-unstable Continue hydroxyzine 25 mg as needed for severe panic attacks Add BuSpar 10 mg p.o. twice daily Referral for CBT  History of anorexia nervosa-we will monitor closely  We will order TSH.  She will go to [facility] lab  I have reviewed psychotherapy notes from [facility]. Patient to sign a release to obtain medical records from [facility]-medication management records.  Follow-up in clinic in 2 weeks or sooner if needed.  I have spent atleast 60 minutes face to face by video with patient today. More than 50 % of the time was spent for preparing to see the patient ( e.g., review of test, records ), obtaining and to review and separately obtained history , ordering medications and test ,psychoeducation and supportive psychotherapy and care coordination,as well as documenting clinical information in electronic health record,interpreting and communication of test results This note was generated in part or whole with voice recognition software. Voice recognition is usually quite accurate but there are transcription errors that can and very often do occur. I apologize for any typographical errors that were not detected and corrected.  Jomarie LongsSaramma Anchor Dwan, MD 2/8/202212:55 PM

## 2020-09-26 NOTE — Patient Instructions (Signed)
Buspirone tablets What is this medicine? BUSPIRONE (byoo SPYE rone) is used to treat anxiety disorders. This medicine may be used for other purposes; ask your health care provider or pharmacist if you have questions. COMMON BRAND NAME(S): BuSpar What should I tell my health care provider before I take this medicine? They need to know if you have any of these conditions: kidney or liver disease an unusual or allergic reaction to buspirone, other medicines, foods, dyes, or preservatives pregnant or trying to get pregnant breast-feeding How should I use this medicine? Take this medicine by mouth with a glass of water. Follow the directions on the prescription label. You may take this medicine with or without food. To ensure that this medicine always works the same way for you, you should take it either always with or always without food. Take your doses at regular intervals. Do not take your medicine more often than directed. Do not stop taking except on the advice of your doctor or health care professional. Talk to your pediatrician regarding the use of this medicine in children. Special care may be needed. Overdosage: If you think you have taken too much of this medicine contact a poison control center or emergency room at once. NOTE: This medicine is only for you. Do not share this medicine with others. What if I miss a dose? If you miss a dose, take it as soon as you can. If it is almost time for your next dose, take only that dose. Do not take double or extra doses. What may interact with this medicine? Do not take this medicine with any of the following medications: linezolid MAOIs like Carbex, Eldepryl, Marplan, Nardil, and Parnate methylene blue procarbazine This medicine may also interact with the following medications: diazepam digoxin diltiazem erythromycin grapefruit juice haloperidol medicines for mental depression or mood problems medicines for seizures like carbamazepine,  phenobarbital and phenytoin nefazodone other medications for anxiety rifampin ritonavir some antifungal medicines like itraconazole, ketoconazole, and voriconazole verapamil warfarin This list may not describe all possible interactions. Give your health care provider a list of all the medicines, herbs, non-prescription drugs, or dietary supplements you use. Also tell them if you smoke, drink alcohol, or use illegal drugs. Some items may interact with your medicine. What should I watch for while using this medicine? Visit your doctor or health care professional for regular checks on your progress. It may take 1 to 2 weeks before your anxiety gets better. You may get drowsy or dizzy. Do not drive, use machinery, or do anything that needs mental alertness until you know how this drug affects you. Do not stand or sit up quickly, especially if you are an older patient. This reduces the risk of dizzy or fainting spells. Alcohol can make you more drowsy and dizzy. Avoid alcoholic drinks. What side effects may I notice from receiving this medicine? Side effects that you should report to your doctor or health care professional as soon as possible: blurred vision or other vision changes chest pain confusion difficulty breathing feelings of hostility or anger muscle aches and pains numbness or tingling in hands or feet ringing in the ears skin rash and itching vomiting weakness Side effects that usually do not require medical attention (report to your doctor or health care professional if they continue or are bothersome): disturbed dreams, nightmares headache nausea restlessness or nervousness sore throat and nasal congestion stomach upset This list may not describe all possible side effects. Call your doctor for medical advice about side   effects. You may report side effects to FDA at 1-800-FDA-1088. Where should I keep my medicine? Keep out of the reach of children. Store at room temperature  below 30 degrees C (86 degrees F). Protect from light. Keep container tightly closed. Throw away any unused medicine after the expiration date. NOTE: This sheet is a summary. It may not cover all possible information. If you have questions about this medicine, talk to your doctor, pharmacist, or health care provider.  2021 Elsevier/Gold Standard (2010-03-15 18:06:11)  

## 2020-09-26 NOTE — Telephone Encounter (Signed)
faxed and confirmed labwork orders sent to [facility]   dx: f33.1, f41.1,  f41.0

## 2020-09-28 ENCOUNTER — Other Ambulatory Visit: Payer: Self-pay

## 2020-09-28 DIAGNOSIS — D508 Other iron deficiency anemias: Secondary | ICD-10-CM

## 2020-09-28 DIAGNOSIS — D509 Iron deficiency anemia, unspecified: Secondary | ICD-10-CM

## 2020-10-02 ENCOUNTER — Other Ambulatory Visit: Payer: Self-pay

## 2020-10-02 ENCOUNTER — Inpatient Hospital Stay: Payer: Medicare Other | Attending: Hematology and Oncology

## 2020-10-02 DIAGNOSIS — D509 Iron deficiency anemia, unspecified: Secondary | ICD-10-CM | POA: Insufficient documentation

## 2020-10-02 LAB — CBC WITH DIFFERENTIAL/PLATELET
Abs Immature Granulocytes: 0.01 10*3/uL (ref 0.00–0.07)
Basophils Absolute: 0 10*3/uL (ref 0.0–0.1)
Basophils Relative: 0 %
Eosinophils Absolute: 0.1 10*3/uL (ref 0.0–0.5)
Eosinophils Relative: 1 %
HCT: 39.6 % (ref 36.0–46.0)
Hemoglobin: 13.2 g/dL (ref 12.0–15.0)
Immature Granulocytes: 0 %
Lymphocytes Relative: 26 %
Lymphs Abs: 1.4 10*3/uL (ref 0.7–4.0)
MCH: 31.5 pg (ref 26.0–34.0)
MCHC: 33.3 g/dL (ref 30.0–36.0)
MCV: 94.5 fL (ref 80.0–100.0)
Monocytes Absolute: 0.4 10*3/uL (ref 0.1–1.0)
Monocytes Relative: 7 %
Neutro Abs: 3.5 10*3/uL (ref 1.7–7.7)
Neutrophils Relative %: 66 %
Platelets: 199 10*3/uL (ref 150–400)
RBC: 4.19 MIL/uL (ref 3.87–5.11)
RDW: 11.9 % (ref 11.5–15.5)
WBC: 5.3 10*3/uL (ref 4.0–10.5)
nRBC: 0 % (ref 0.0–0.2)

## 2020-10-02 LAB — FERRITIN: Ferritin: 30 ng/mL (ref 11–307)

## 2020-10-02 NOTE — Progress Notes (Signed)
Patient states she definitely wants to be scheduled for Venofer, will notify schedulers to call and get her scheduled.

## 2020-10-04 ENCOUNTER — Telehealth: Payer: Self-pay | Admitting: Hematology and Oncology

## 2020-10-04 NOTE — Telephone Encounter (Signed)
10/04/2020 Left VM for pt informing her that appt for Venofer infusion, per Dr. Salena Saner due to recent lab results, has been scheduled for 10/11/20 @ 2 pm. Left callback number in case pt has any questions or issues w/ date/time  SRW

## 2020-10-10 ENCOUNTER — Telehealth: Payer: No Typology Code available for payment source | Admitting: Psychiatry

## 2020-10-11 ENCOUNTER — Ambulatory Visit: Payer: No Typology Code available for payment source | Admitting: Licensed Clinical Social Worker

## 2020-10-11 ENCOUNTER — Other Ambulatory Visit: Payer: Self-pay

## 2020-10-11 ENCOUNTER — Inpatient Hospital Stay: Payer: Medicare Other

## 2020-10-23 ENCOUNTER — Ambulatory Visit (INDEPENDENT_AMBULATORY_CARE_PROVIDER_SITE_OTHER): Payer: Medicare Other | Admitting: Licensed Clinical Social Worker

## 2020-10-23 ENCOUNTER — Other Ambulatory Visit: Payer: Self-pay

## 2020-10-23 DIAGNOSIS — F4310 Post-traumatic stress disorder, unspecified: Secondary | ICD-10-CM | POA: Diagnosis not present

## 2020-10-23 DIAGNOSIS — F422 Mixed obsessional thoughts and acts: Secondary | ICD-10-CM

## 2020-10-23 DIAGNOSIS — F411 Generalized anxiety disorder: Secondary | ICD-10-CM

## 2020-10-23 NOTE — Progress Notes (Signed)
Virtual Visit via Video Note  I connected with Latasha [name] on 10/23/20 at  8:00 AM EST by a video enabled telemedicine application and verified that I am speaking with the correct person using two identifiers.  Location: Patient: home Provider: remote office [place], [place])   I discussed the limitations of evaluation and management by telemedicine and the availability of in person appointments. The patient expressed understanding and agreed to proceed.  I discussed the assessment and treatment plan with the patient. The patient was provided an opportunity to ask questions and all were answered. The patient agreed with the plan and demonstrated an understanding of the instructions.   The patient was advised to call back or seek an in-person evaluation if the symptoms worsen or if the condition fails to improve as anticipated.  I provided 60 minutes of non-face-to-face time during this encounter.   Latesha Chesney R Randolf Sansoucie, LCSW    THERAPIST PROGRESS NOTE  Session Time: 8-9a  Participation Level: Active  Behavioral Response: Neat and Well GroomedAlertAnxious  Type of Therapy: Individual Therapy  Treatment Goals addressed: Anxiety and Coping  Interventions: CBT and Supportive  Summary: Latasha [name] is a 35 y.o. female who presents with symptoms consistent with anxiety/PTSD.   Assessment/development of treatment plan during session today. Pt very cooperative throughout.   Suicidal/Homicidal: No  Therapist Response: Assessment, development of treatment plan  Plan: Return again in 3 weeks.  Diagnosis: Axis I: Generalized Anxiety Disorder and Post Traumatic Stress Disorder    Axis II: No diagnosis    [name] Mahreen Schewe, LCSW 10/23/2020
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
## 2020-10-23 NOTE — Patient Instructions (Signed)
Managing Anxiety, Adult After being diagnosed with an anxiety disorder, you may be relieved to know why you have felt or behaved a certain way. You may also feel overwhelmed about the treatment ahead and what it will mean for your life. With care and support, you can manage this condition and recover from it. How to manage lifestyle changes Managing stress and anxiety Stress is your body's reaction to life changes and events, both good and bad. Most stress will last just a few hours, but stress can be ongoing and can lead to more than just stress. Although stress can play a major role in anxiety, it is not the same as anxiety. Stress is usually caused by something external, such as a deadline, test, or competition. Stress normally passes after the triggering event has ended.  Anxiety is caused by something internal, such as imagining a terrible outcome or worrying that something will go wrong that will devastate you. Anxiety often does not go away even after the triggering event is over, and it can become long-term (chronic) worry. It is important to understand the differences between stress and anxiety and to manage your stress effectively so that it does not lead to an anxious response. Talk with your health care provider or a counselor to learn more about reducing anxiety and stress. He or she may suggest tension reduction techniques, such as:  Music therapy. This can include creating or listening to music that you enjoy and that inspires you.  Mindfulness-based meditation. This involves being aware of your normal breaths while not trying to control your breathing. It can be done while sitting or walking.  Centering prayer. This involves focusing on a word, phrase, or sacred image that means something to you and brings you peace.  Deep breathing. To do this, expand your stomach and inhale slowly through your nose. Hold your breath for 3-5 seconds. Then exhale slowly, letting your stomach muscles  relax.  Self-talk. This involves identifying thought patterns that lead to anxiety reactions and changing those patterns.  Muscle relaxation. This involves tensing muscles and then relaxing them. Choose a tension reduction technique that suits your lifestyle and personality. These techniques take time and practice. Set aside 5-15 minutes a day to do them. Therapists can offer counseling and training in these techniques. The training to help with anxiety may be covered by some insurance plans. Other things you can do to manage stress and anxiety include:  Keeping a stress/anxiety diary. This can help you learn what triggers your reaction and then learn ways to manage your response.  Thinking about how you react to certain situations. You may not be able to control everything, but you can control your response.  Making time for activities that help you relax and not feeling guilty about spending your time in this way.  Visual imagery and yoga can help you stay calm and relax.   Medicines Medicines can help ease symptoms. Medicines for anxiety include:  Anti-anxiety drugs.  Antidepressants. Medicines are often used as a primary treatment for anxiety disorder. Medicines will be prescribed by a health care provider. When used together, medicines, psychotherapy, and tension reduction techniques may be the most effective treatment. Relationships Relationships can play a big part in helping you recover. Try to spend more time connecting with trusted friends and family members. Consider going to couples counseling, taking family education classes, or going to family therapy. Therapy can help you and others better understand your condition. How to recognize changes in your   anxiety Everyone responds differently to treatment for anxiety. Recovery from anxiety happens when symptoms decrease and stop interfering with your daily activities at home or work. This may mean that you will start to:  Have  better concentration and focus. Worry will interfere less in your daily thinking.  Sleep better.  Be less irritable.  Have more energy.  Have improved memory. It is important to recognize when your condition is getting worse. Contact your health care provider if your symptoms interfere with home or work and you feel like your condition is not improving. Follow these instructions at home: Activity  Exercise. Most adults should do the following: ? Exercise for at least 150 minutes each week. The exercise should increase your heart rate and make you sweat (moderate-intensity exercise). ? Strengthening exercises at least twice a week.  Get the right amount and quality of sleep. Most adults need 7-9 hours of sleep each night. Lifestyle  Eat a healthy diet that includes plenty of vegetables, fruits, whole grains, low-fat dairy products, and lean protein. Do not eat a lot of foods that are high in solid fats, added sugars, or salt.  Make choices that simplify your life.  Do not use any products that contain nicotine or tobacco, such as cigarettes, e-cigarettes, and chewing tobacco. If you need help quitting, ask your health care provider.  Avoid caffeine, alcohol, and certain over-the-counter cold medicines. These may make you feel worse. Ask your pharmacist which medicines to avoid.   General instructions  Take over-the-counter and prescription medicines only as told by your health care provider.  Keep all follow-up visits as told by your health care provider. This is important. Where to find support You can get help and support from these sources:  Self-help groups.  Online and community organizations.  A trusted spiritual leader.  Couples counseling.  Family education classes.  Family therapy. Where to find more information You may find that joining a support group helps you deal with your anxiety. The following sources can help you locate counselors or support groups near  you:  Mental Health America: www.mentalhealthamerica.net  Anxiety and Depression Association of America (ADAA): www.adaa.org  National Alliance on Mental Illness (NAMI): www.nami.org Contact a health care provider if you:  Have a hard time staying focused or finishing daily tasks.  Spend many hours a day feeling worried about everyday life.  Become exhausted by worry.  Start to have headaches, feel tense, or have nausea.  Urinate more than normal.  Have diarrhea. Get help right away if you have:  A racing heart and shortness of breath.  Thoughts of hurting yourself or others. If you ever feel like you may hurt yourself or others, or have thoughts about taking your own life, get help right away. You can go to your nearest emergency department or call:  Your local emergency services (911 in the [place]  A suicide crisis helpline, such as the National Suicide Prevention Lifeline at 1-800-273-8255. This is open 24 hours a day. Summary  Taking steps to learn and use tension reduction techniques can help calm you and help prevent triggering an anxiety reaction.  When used together, medicines, psychotherapy, and tension reduction techniques may be the most effective treatment.  Family, friends, and partners can play a big part in helping you recover from an anxiety disorder. This information is not intended to replace advice given to you by your health care provider. Make sure you discuss any questions you have with your health care provider. Document   Revised: 01/05/2019 Document Reviewed: 01/05/2019 Elsevier Patient Education  2021 [employer] Inc.  Managing Post-Traumatic Stress Disorder If you have been diagnosed with post-traumatic stress disorder (PTSD), you may be relieved that you now know why you have felt or behaved a certain way. Still, you may feel overwhelmed about the treatment ahead. You may also wonder how to get the support you need and how to deal with the condition  day-to-day. If you are living with PTSD, there are ways to help you recover from it and manage your symptoms. How to manage lifestyle changes Managing stress Stress is your body's reaction to life changes and events, both good and bad. Stress can make PTSD worse. Take the following steps to manage stress:  Talk with your health care provider or a counselor if you would like to learn more about techniques to reduce your stress. He or she may suggest some stress reduction techniques such as: ? Muscle relaxation exercises. ? Regular exercise. ? Meditation, yoga, or other mind-body exercises. ? Breathing exercises. ? Listening to quiet music. ? Spending time outside.  Maintain a healthy lifestyle. Eat a healthy diet, exercise regularly, get plenty of sleep, and take time to relax.  Spend time with others. Talk with them about how you are feeling and what kind of support you need. Try not to isolate yourself, even though you may feel like doing that. Isolating yourself can delay your recovery.  Do activities and hobbies that you enjoy.  Pace yourself when doing stressful things. Take breaks, and reward yourself when you finish. Make sure that you do not overload your schedule.   Medicines Your health care provider may suggest certain medicines if he or she feels that they will help to improve your condition. Medicines for depression (antidepressants) or severe loss of contact with reality (antipsychotics) may be used to treat PTSD. Avoid using alcohol and other substances that may prevent your medicines from working properly. It is also important to:  Talk with your pharmacist or health care provider about all medicines that you take, their possible side effects, and which medicines are safe to take together.  Make it your goal to take part in all treatment decisions (shared decision-making). Ask about possible side effects of medicines that your health care provider recommends, and tell him or  her how you feel about having those side effects. It is best if shared decision-making with your health care provider is part of your total treatment plan. If your health care provider prescribes a medicine, you may not notice the full benefits of it for 4-8 weeks. Most people who are treated for PTSD need to take medicine for at least 6-12 months before they feel better. If you are taking medicines as part of your treatment, do not stop taking medicines before you ask your health care provider if it is safe to stop. You may need to have the medicine slowly decreased (tapered) over time to lower the risk of harmful side effects. Relationships Many people who have PTSD have difficulty trusting others. Make an effort to:  Take risks and develop trust with close friends and family members. Developing trust in others can help you feel safe and connect you with emotional support.  Be open and honest about your feelings.  Have fun and relax in safe spaces, such as with friends and family.  Think about going to couples counseling, family education classes, or family therapy. Your loved ones may not always know how to be supportive. Therapy can  be helpful for everyone. How to recognize changes in your condition Be aware of your symptoms and how often you have them. The following symptoms mean that you need to seek help for your PTSD:  You feel suspicious and angry.  You have repeated flashbacks.  You avoid going out or being with others.  You have an increasing number of fights with close friends or family members, such as your spouse.  You have thoughts about hurting yourself or others.  You cannot get relief from feelings of depression or anxiety. Follow these instructions at home: Lifestyle  Exercise regularly. Try to do 30 or more minutes of physical activity on most days of the week.  Try to get 7-9 hours of sleep each night. To help with sleep: ? Keep your bedroom cool and  dark. ? Avoid screen time before bedtime. This means avoiding use of your TV, computer, tablet, and cell phone.  Practice self-soothing skills and use them daily.  Try to have fun and seek humor in your life. Eating and drinking  Do not eat a heavy meal during the hour before you go to bed.  Do not drink alcohol or caffeinated drinks before bed.  Avoid using alcohol or drugs. General instructions  If your PTSD is affecting your marriage or family, seek help from a family therapist.  Remind yourself that recovering from the trauma is a process and takes time.  Take over-the-counter and prescription medicines only as told by your health care provider.  Make sure to let all of your health care providers know that you have PTSD. This is especially important if you are having surgery or need to be admitted to the hospital.  Keep all follow-up visits as told by your health care providers. This is important. Where to find support Talking to others  Explain that PTSD is a mental health problem. It is something that a person can develop after experiencing or seeing a life-threatening event. Tell them that PTSD makes you feel stress like you did during the event.  Talk to your loved ones about the symptoms you have. Also tell them what things or situations can cause symptoms to start (are triggers for you).  Assure your loved ones that there are treatments to help PTSD. Discuss possibly seeking family therapy or couples therapy.  If you are worried or fearful about seeking treatment, ask for support.  Keep daily contact with at least one trusted friend or family member. Finances Not all insurance plans cover mental health care, so it is important to check with your insurance carrier. If paying for co-pays or counseling services is a problem, search for a local or county mental health care center. Public mental health care services may be offered there at a low cost or no cost when you are  not able to see a private health care provider. If you are a veteran, contact a local veterans organization or veterans hospital for more information. If you are taking medicine for PTSD, you may be able to get the genericform, which may be less expensive than brand-name medicine. Some makers of prescription medicines also offer help to patients who cannot afford the medicines that they need. Therapy and support groups  Find a support group in your community. Often, groups are available for [employer] veterans, trauma victims, and family members or caregivers.  Look into volunteer opportunities. Taking part in these can help you feel more connected to your community.  Contact a local organization to find out  if you are eligible for a service dog. Where to find more information Go to this website to find more information about PTSD, treatment of PTSD, and how to get support:  [facility] for PTSD: www.ptsd.FitBoxer.tn Contact a health care provider if:  Your symptoms get worse or do not get better. Get help right away if:  You have thoughts about hurting yourself or others. If you ever feel like you may hurt yourself or others, or have thoughts about taking your own life, get help right away. You can go to your nearest emergency department or call:  Your local emergency services (911 in the [place]  A suicide crisis helpline, such as the National Suicide Prevention Lifeline at 408-231-6063. This is open 24-hours a day. Summary  If you are living with PTSD, there are ways to help you recover from it and manage your symptoms.  Find supportive environments and people who understand PTSD. Spend time in those places, and maintain contact with those people.  Work with your health care team to create a plan for managing PTSD. The plan should include counseling, stress reduction techniques, and healthy lifestyle habits. This information is not intended to replace advice given to you by your  health care provider. Make sure you discuss any questions you have with your health care provider. Document Revised: 04/21/2020 Document Reviewed: 04/21/2020 Elsevier Patient Education  2021 Elsevier Inc.  [email or web address]  Panic Attack A panic attack is when you suddenly feel very afraid, uncomfortable, or nervous (anxious). A panic attack can happen when you are scared or for no reason. A panic attack can feel like a serious problem. It can even feel like a heart attack or stroke. See your doctor when you have a panic attack to make sure you do not have a serious problem. Follow these instructions at home:  Take medicines only as told by your doctor.  If you feel worried or nervous, try not to have caffeine.  Take good care of your health. To do this: ? Eat healthy. Make sure to eat fresh fruits and vegetables, whole grains, lean meats, and low-fat dairy. ? Get enough sleep. Try to sleep for 7-8 hours each night. ? Exercise. Try to be active for 30 minutes 5 or more days a week. ? Do not smoke. Talk to your doctor if you need help quitting. ? Limit how much alcohol you drink:  If you are a woman who is not pregnant: try not to have more than 1 drink a day.  If you are a man: try not to have more than 2 drinks a day.  One drink equals 12 oz of beer, 5 oz of wine, or 1 oz of hard liquor.  Keep all follow-up visits as told by your doctor. This is important.   Contact a doctor if:  Your symptoms do not get better.  Your symptoms get worse.  You are not able to take your medicines as told. Get help right away if:  You have thoughts of hurting yourself or others.  You have symptoms of a panic attack. Do not drive yourself to the hospital. Have someone else drive you or call an ambulance. If you feel like you may hurt yourself or others, or have thoughts about taking your own life, get help right away. You can go to your  nearest emergency department or call:  Your local emergency services (911 in the [place]  A suicide crisis helpline, such as the National Suicide Prevention Lifeline at 308 706 8814.  This is open 24 hours a day. Summary  A panic attack is when you suddenly feel very afraid, uncomfortable, or nervous (anxious).  See your doctor when you have a panic attack to make sure that you do not have another serious problem.  If you feel like you may hurt yourself or others, get help right away by calling 911. This information is not intended to replace advice given to you by your health care provider. Make sure you discuss any questions you have with your health care provider. Document Revised: 02/03/2020 Document Reviewed: 02/03/2020 Elsevier Patient Education  2021 [employer]

## 2020-10-24 ENCOUNTER — Inpatient Hospital Stay: Payer: Medicare Other

## 2020-10-24 ENCOUNTER — Inpatient Hospital Stay: Payer: Medicare Other | Attending: Hematology and Oncology

## 2020-10-24 VITALS — BP 115/74 | HR 57 | Temp 97.4°F | Resp 18

## 2020-10-24 DIAGNOSIS — D508 Other iron deficiency anemias: Secondary | ICD-10-CM

## 2020-10-24 DIAGNOSIS — D509 Iron deficiency anemia, unspecified: Secondary | ICD-10-CM

## 2020-10-24 DIAGNOSIS — Z79899 Other long term (current) drug therapy: Secondary | ICD-10-CM | POA: Insufficient documentation

## 2020-10-24 LAB — PREGNANCY, URINE: Preg Test, Ur: NEGATIVE

## 2020-10-24 MED ORDER — SODIUM CHLORIDE 0.9 % IV SOLN: 100.0000 mg | Freq: Once | INTRAVENOUS | Status: DC

## 2020-10-24 MED ORDER — IRON SUCROSE 20 MG/ML IV SOLN
100.0000 mg | Freq: Once | INTRAVENOUS | Status: AC
  Administered 2020-10-24: 100 mg via INTRAVENOUS
  Filled 2020-10-24: qty 5

## 2020-10-24 MED ORDER — SODIUM CHLORIDE 0.9 % IV SOLN
Freq: Once | INTRAVENOUS | Status: AC
  Filled 2020-10-24: qty 250

## 2020-10-30 ENCOUNTER — Other Ambulatory Visit
Admission: RE | Admit: 2020-10-30 | Discharge: 2020-10-30 | Disposition: A | Discharge status: Home / Self Care | Payer: Medicare Other | Source: Ambulatory Visit | Attending: Psychiatry | Admitting: Psychiatry

## 2020-10-30 ENCOUNTER — Telehealth (INDEPENDENT_AMBULATORY_CARE_PROVIDER_SITE_OTHER): Payer: Medicare Other | Admitting: Psychiatry

## 2020-10-30 ENCOUNTER — Encounter: Payer: Self-pay | Admitting: Psychiatry

## 2020-10-30 ENCOUNTER — Other Ambulatory Visit: Payer: Self-pay

## 2020-10-30 DIAGNOSIS — F411 Generalized anxiety disorder: Secondary | ICD-10-CM

## 2020-10-30 DIAGNOSIS — F4310 Post-traumatic stress disorder, unspecified: Secondary | ICD-10-CM | POA: Diagnosis not present

## 2020-10-30 DIAGNOSIS — F410 Panic disorder [episodic paroxysmal anxiety] without agoraphobia: Secondary | ICD-10-CM | POA: Insufficient documentation

## 2020-10-30 DIAGNOSIS — F331 Major depressive disorder, recurrent, moderate: Secondary | ICD-10-CM | POA: Insufficient documentation

## 2020-10-30 DIAGNOSIS — Z8659 Personal history of other mental and behavioral disorders: Secondary | ICD-10-CM

## 2020-10-30 LAB — TSH: TSH: 1.276 u[IU]/mL (ref 0.350–4.500)

## 2020-10-30 MED ORDER — ZOLPIDEM TARTRATE 5 MG PO TABS: 5.0000 mg | ORAL_TABLET | Freq: Every evening | ORAL | 1 refills | Status: DC | PRN

## 2020-10-30 MED ORDER — SERTRALINE HCL 100 MG PO TABS: 150.0000 mg | ORAL_TABLET | Freq: Every day | ORAL | 1 refills | Status: DC

## 2020-10-30 MED ORDER — BUSPIRONE HCL 10 MG PO TABS
20.0000 mg | ORAL_TABLET | Freq: Two times a day (BID) | ORAL | 1 refills | Status: DC
Start: 2020-10-30 — End: 2020-11-29

## 2020-10-30 NOTE — Patient Instructions (Signed)
Zolpidem Tablets What is this medicine? ZOLPIDEM (zole PI dem) is used to treat insomnia. This medicine helps you to fall asleep and sleep through the night. This medicine may be used for other purposes; ask your health care provider or pharmacist if you have questions. COMMON BRAND NAME(S): Ambien What should I tell my health care provider before I take this medicine? They need to know if you have any of these conditions:  depression  history of drug abuse or addiction  if you often drink alcohol  liver disease  lung or breathing disease  myasthenia gravis  sleep apnea  sleep-walking, driving, eating or other activity while not fully awake after taking a sleep medicine  suicidal thoughts, plans, or attempt; a previous suicide attempt by you or a family member  an unusual or allergic reaction to zolpidem, other medicines, foods, dyes, or preservatives  pregnant or trying to get pregnant  breast-feeding How should I use this medicine? Take this medicine by mouth with a glass of water. Follow the directions on the prescription label. It is better to take this medicine on an empty stomach and only when you are ready for bed. Do not take your medicine more often than directed. If you have been taking this medicine for several weeks and suddenly stop taking it, you may get unpleasant withdrawal symptoms. Your doctor or health care professional may want to gradually reduce the dose. Do not stop taking this medicine on your own. Always follow your doctor or health care professional's advice. A special MedGuide will be given to you by the pharmacist with each prescription and refill. Be sure to read this information carefully each time. Talk to your pediatrician regarding the use of this medicine in children. Special care may be needed. Overdosage: If you think you have taken too much of this medicine contact a poison control center or emergency room at once. NOTE: This medicine is only  for you. Do not share this medicine with others. What if I miss a dose? This does not apply. This medication should only be taken immediately before going to sleep. Do not take double or extra doses. What may interact with this medicine?  alcohol  antihistamines for allergy, cough and cold  certain medicines for anxiety or sleep  certain medicines for depression, like amitriptyline, fluoxetine, sertraline  certain medicines for fungal infections like ketoconazole and itraconazole  certain medicines for seizures like phenobarbital, primidone  ciprofloxacin  dietary supplements for sleep, like valerian or kava kava  general anesthetics like halothane, isoflurane, methoxyflurane, propofol  local anesthetics like lidocaine, pramoxine, tetracaine  medicines that relax muscles for surgery  narcotic medicines for pain  phenothiazines like chlorpromazine, mesoridazine, prochlorperazine, thioridazine  rifampin This list may not describe all possible interactions. Give your health care provider a list of all the medicines, herbs, non-prescription drugs, or dietary supplements you use. Also tell them if you smoke, drink alcohol, or use illegal drugs. Some items may interact with your medicine. What should I watch for while using this medicine? Visit your doctor or health care professional for regular checks on your progress. Keep a regular sleep schedule by going to bed at about the same time each night. Avoid caffeine-containing drinks in the evening hours. When sleep medicines are used every night for more than a few weeks, they may stop working. Talk to your doctor if you still have trouble sleeping. After taking this medicine, you may get up out of bed and do an activity that you   do not know you are doing. The next morning, you may have no memory of this. Activities include driving a car ("sleep-driving"), making and eating food, talking on the phone, sexual activity, and sleep-walking.  Serious injuries have occurred. Stop the medicine and call your doctor right away if you find out you have done any of these activities. Do not take this medicine if you have used alcohol that evening. Do not take it if you have taken another medicine for sleep. The risk of doing these sleep-related activities is higher. Wait for at least 8 hours after you take a dose before driving or doing other activities that require full mental alertness. Do not take this medicine unless you are able to stay in bed for a full night (7 to 8 hours) before you must be active again. You may have a decrease in mental alertness the day after use, even if you feel that you are fully awake. Tell your doctor if you will need to perform activities requiring full alertness, such as driving, the next day. Do not stand or sit up quickly after taking this medicine, especially if you are an older patient. This reduces the risk of dizzy or fainting spells. If you or your family notice any changes in your behavior, such as new or worsening depression, thoughts of harming yourself, anxiety, other unusual or disturbing thoughts, or memory loss, call your doctor right away. After you stop taking this medicine, you may have trouble falling asleep. This is called rebound insomnia. This problem usually goes away on its own after 1 or 2 nights. What side effects may I notice from receiving this medicine? Side effects that you should report to your doctor or health care professional as soon as possible:  allergic reactions like skin rash, itching or hives, swelling of the face, lips, or tongue  breathing problems  changes in vision  confusion  depressed mood or other changes in moods or emotions  feeling faint or lightheaded, falls  hallucinations  loss of balance or coordination  loss of memory  numbness or tingling of the tongue  restlessness, excitability, or feelings of anxiety or agitation  signs and symptoms of liver  injury like dark yellow or brown urine; general ill feeling or flu-like symptoms; light-colored stools; loss of appetite; nausea; right upper belly pain; unusually weak or tired; yellowing of the eyes or skin  suicidal thoughts  unusual activities while not fully awake like driving, eating, making phone calls, or sexual activity Side effects that usually do not require medical attention (report to your doctor or health care professional if they continue or are bothersome):  dizziness  drowsiness the day after you take this medicine  headache This list may not describe all possible side effects. Call your doctor for medical advice about side effects. You may report side effects to FDA at 1-800-FDA-1088. Where should I keep my medicine? Keep out of the reach of children. This medicine can be abused. Keep your medicine in a safe place to protect it from theft. Do not share this medicine with anyone. Selling or giving away this medicine is dangerous and against the law. This medicine may cause accidental overdose and death if taken by other adults, children, or pets. Mix any unused medicine with a substance like cat litter or coffee grounds. Then throw the medicine away in a sealed container like a sealed bag or a coffee can with a lid. Do not use the medicine after the expiration date. Store at   room temperature between 20 and 25 degrees C (68 and 77 degrees F). NOTE: This sheet is a summary. It may not cover all possible information. If you have questions about this medicine, talk to your doctor, pharmacist, or health care provider.  2021 Elsevier/Gold Standard (2020-07-28 09:54:34)  

## 2020-10-30 NOTE — Progress Notes (Signed)
Virtual Visit via Video Note  I connected with Latasha [name] on 10/30/20 at 11:30 AM EDT by a video enabled telemedicine application and verified that I am speaking with the correct person using two identifiers.  Location Provider Location : [facility] Patient Location : Home  Participants: Patient , Provider    I discussed the limitations of evaluation and management by telemedicine and the availability of in person appointments. The patient expressed understanding and agreed to proceed.    I discussed the assessment and treatment plan with the patient. The patient was provided an opportunity to ask questions and all were answered. The patient agreed with the plan and demonstrated an understanding of the instructions.   The patient was advised to call back or seek an in-person evaluation if the symptoms worsen or if the condition fails to improve as anticipated.  BH MD OP Progress Note  10/30/2020 12:07 PM GULIANA WEYANDT  MRN:  [id number]  Chief Complaint:  Chief Complaint    Follow-up; Anxiety     HPI: Latasha [name] is a 35 year old Caucasian female, single, on SSD, lives at [place] with her children and parents, has a history of GAD, MDD, PTSD, panic attacks, history of anorexia nervosa, Hirschsprung's disease, iron deficiency anemia, chronic constipation, vasovagal syncope, history of chronic fatigue syndrome was evaluated by telemedicine today.  Patient today reports it was hard for her to reduce the dosage of sertraline to 150 mg.  She reports she has went down to 175 mg for a few days before going down to 150 mg.  She is tolerating it well now.  She reports she is taking the BuSpar as prescribed.  She however reports in spite of taking the BuSpar and the Zoloft together she has not noticed much benefit in her anxiety or depressive symptoms.  She reports she continues to struggle with anxiety symptoms on a regular basis.  She reports she often feels restless, worries about  different things.  She reports her children have health problems and that makes her more worried.  She does have a history of health problems as a child and her children's health issues brings back posttraumatic memories.  She is currently working with her therapist.  She has upcoming appointments scheduled.  Patient reports in spite of sleeping for 9 to 10 hours at night she still wakes up feeling sluggish and groggy.  She does have a history of chronic fatigue syndrome.  She did have sleep studies done in the past.  She also has a history of vasovagal syncope and is currently following up with cardiology.  She is not currently on sleep medications.  She does take hydroxyzine on and off however she feels the hydroxyzine makes her groggy.  Patient denies any suicidality, homicidality or perceptual disturbances.  Patient denies any other concerns today.  Visit Diagnosis:    ICD-10-CM   1. GAD (generalized anxiety disorder)  F41.1 busPIRone (BUSPAR) 10 MG tablet    sertraline (ZOLOFT) 100 MG tablet  2. MDD (major depressive disorder), recurrent episode, moderate (HCC)  F33.1 busPIRone (BUSPAR) 10 MG tablet    sertraline (ZOLOFT) 100 MG tablet  3. PTSD (post-traumatic stress disorder)  F43.10 busPIRone (BUSPAR) 10 MG tablet    sertraline (ZOLOFT) 100 MG tablet    zolpidem (AMBIEN) 5 MG tablet  4. Panic attacks  F41.0 busPIRone (BUSPAR) 10 MG tablet    sertraline (ZOLOFT) 100 MG tablet  5. History of anorexia nervosa  Z86.59     Past  Psychiatric History: I have reviewed past psychiatric history from my progress note on 09/26/2020.  Past trials of Zoloft, Wellbutrin, BuSpar, Prozac, Paxil, Xanax  Past Medical History:  Past Medical History:  Diagnosis Date  . Acid reflux   . Anemia   . Anxiety   . Asthma   . Chronic constipation   . Hirschsprung disease   . Pelvic floor dysfunction   . Syncope, cardiogenic     Past Surgical History:  Procedure Laterality Date  . abdomial adhesions  removed  2012  . APPENDECTOMY  1997  . bladder repair surgery  1997  . c- sections  [id number]  . enodmetiosis removed  2012  . OVARIAN CYST REMOVAL  2003, 2012  . TONSILLECTOMY  2007    Family Psychiatric History: Reviewed family psychiatric history from my progress note on 09/26/2020  Family History:  Family History  Problem Relation Age of Onset  . Cancer Mother   . Diabetes Mother   . Hypertension Mother   . Cancer Father   . Alcohol abuse Father   . Depression Father   . Cancer Maternal Grandfather   . Factor IX deficiency Maternal Grandfather   . Factor IX deficiency Son   . ADD / ADHD Son   . Autism Son   . Kidney cancer Neg Hx   . Bladder Cancer Neg Hx     Social History: Reviewed social history from my progress note on 09/26/2020 Social History   Socioeconomic History  . Marital status: Single    Spouse name: Not on file  . Number of children: 2  . Years of education: 12 th grade, some college  . Highest education level: Not on file  Occupational History  . Occupation: disabled  Tobacco Use  . Smoking status: Never Smoker  . Smokeless tobacco: Never Used  Substance and Sexual Activity  . Alcohol use: No  . Drug use: No  . Sexual activity: Not on file  Other Topics Concern  . Not on file  Social History Narrative  . Not on file   Social Determinants of Health   Financial Resource Strain: Not on file  Food Insecurity: Not on file  Transportation Needs: Not on file  Physical Activity: Not on file  Stress: Not on file  Social Connections: Not on file    Allergies:  Allergies  Allergen Reactions  . Meperidine Anaphylaxis    Other reaction(s): Other (See Comments) Other Reaction: Not Assessed  . Lactose     Other reaction(s): Other (See Comments) Other Reaction: GI Upset  . Fludrocortisone Hives  . Levofloxacin Hives  . Sulfamethoxazole-Trimethoprim Other (See Comments)    Other reaction(s): Dizziness    Metabolic Disorder Labs: No results  found for: HGBA1C, MPG No results found for: PROLACTIN No results found for: CHOL, TRIG, HDL, CHOLHDL, VLDL, LDLCALC Lab Results  Component Value Date   TSH 1.276 10/30/2020    Therapeutic Level Labs: No results found for: LITHIUM No results found for: VALPROATE No components found for:  CBMZ  Current Medications: Current Outpatient Medications  Medication Sig Dispense Refill  . zolpidem (AMBIEN) 5 MG tablet Take 1 tablet (5 mg total) by mouth at bedtime as needed for sleep. 12 tablet 1  . Acidophilus Lactobacillus CAPS Take 1 tablet by mouth daily.    Marland Kitchen albuterol (VENTOLIN HFA) 108 (90 Base) MCG/ACT inhaler Inhale 1 puff into the lungs every 6 (six) hours as needed for wheezing or shortness of breath.     . busPIRone (  BUSPAR) 10 MG tablet Take 2 tablets (20 mg total) by mouth 2 (two) times daily. 120 tablet 1  . EPINEPHrine (EPIPEN 2-PAK) 0.3 mg/0.3 mL IJ SOAJ injection Inject 0.3 mg into the muscle as needed. Reported on 02/07/2016    . fluticasone (FLONASE) 50 MCG/ACT nasal spray SHAKE LQ AND U 2 SPRAYS IEN QD    . hydrOXYzine (ATARAX/VISTARIL) 25 MG tablet TK 1 T PO QD PRN    . midodrine (PROAMATINE) 10 MG tablet Take 10 mg by mouth daily.     . norethindrone-ethinyl estradiol (LOESTRIN) 1-20 MG-MCG tablet Take 1 tablet by mouth daily.    Marland Kitchen. omeprazole (PRILOSEC) 40 MG capsule 1 capsule by mouth daily (Patient not taking: Reported on 09/26/2020)  0  . ondansetron (ZOFRAN) 4 MG tablet Take 4 mg by mouth every 8 (eight) hours as needed for nausea.    . ondansetron (ZOFRAN-ODT) 4 MG disintegrating tablet Take by mouth. (Patient not taking: No sig reported)    . Plecanatide 3 MG TABS Take 3 mg by mouth daily.  (Patient not taking: Reported on 09/26/2020)    . promethazine (PHENERGAN) 25 MG tablet Take 25 mg by mouth every 8 (eight) hours as needed for vomiting.    . sertraline (ZOLOFT) 100 MG tablet Take 1.5 tablets (150 mg total) by mouth daily. 45 tablet 1  . Sodium Phosphates (RA ENEMA)  7-19 GM/118ML ENEM Place rectally as needed (constipation).     No current facility-administered medications for this visit.     Musculoskeletal: Strength & Muscle Tone: UTA Gait & Station: UTA Patient leans: N/A  Psychiatric Specialty Exam: Review of Systems  Constitutional: Positive for fatigue.  Psychiatric/Behavioral: Positive for dysphoric mood and sleep disturbance. The patient is nervous/anxious.   All other systems reviewed and are negative.   There were no vitals taken for this visit.There is no height or weight on file to calculate BMI.  General Appearance: Casual  Eye Contact:  Fair  Speech:  Clear and Coherent  Volume:  Normal  Mood:  Anxious and Depressed  Affect:  Congruent  Thought Process:  Goal Directed and Descriptions of Associations: Intact  Orientation:  Full (Time, Place, and Person)  Thought Content: Logical   Suicidal Thoughts:  No  Homicidal Thoughts:  No  Memory:  Immediate;   Fair Recent;   Fair Remote;   Fair  Judgement:  Fair  Insight:  Fair  Psychomotor Activity:  Normal  Concentration:  Concentration: Fair and Attention Span: Good  Recall:  Good  Fund of Knowledge: Fair  Language: Fair  Akathisia:  No  Handed:  Right  AIMS (if indicated): UTA  Assets:  Communication Skills Desire for Improvement Housing Social Support  ADL's:  Intact  Cognition: WNL  Sleep:  Restless   Screenings: GAD-7   Flowsheet Row Video Visit from 09/26/2020 in [facility]  Total GAD-7 Score 13    PHQ2-9   Flowsheet Row Video Visit [facility] 10/30/2020 in [facility] Video Visit from 09/26/2020 in [facility]  PHQ-2 Total Score 5 5  PHQ-9 Total Score 14 14    Flowsheet Row Video Visit from 10/30/2020 in [facility] Counselor from 10/23/2020 in [facility]  C-SSRS RISK CATEGORY No Risk No Risk       Assessment and Plan:  Latasha HandingChelsea E [name] is a 35 year old Caucasian female on disability, single, [facility] [place], has a history of depression, anxiety, eating disorder, PTSD, panic  attacks, Hirschsprung's disease, chronic constipation, iron deficiency was evaluated by telemedicine today.  Patient is biologically predisposed given her multiple health problems, history of trauma, history of mental health problems in her family.  Patient with psychosocial stressors of being a single mother, children with health issues and her own health problems.  She continues to struggle with depression, anxiety as well as chronic fatigue.  She will benefit from the following plan.  Plan MDD-unstable PHQ 9 today equals 14 Continue Zoloft 150 mg p.o. daily-reduced dosage Increase BuSpar to 20 mg p.o. twice daily Continue CBT with Ms. Christina Hussami  GAD-unstable Increase BuSpar to 20 mg p.o. twice daily Zoloft 150 mg p.o. daily Continue CBT  PTSD-unstable Continue CBT Continue Zoloft. Add Ambien 5 mg p.o. nightly as needed. Provided medication education.  Panic attacks-unstable Hydroxyzine 25 mg p.o. as needed for severe panic attacks Continue BuSpar however will increase the dosage Continue Zoloft.  Discussed with patient that she can be started on a new antidepressant with Zoloft and the BuSpar combination continues to not help.  She also struggles with chronic fatigue syndrome which could be contributing to some of her symptoms.  We will consider sleep study in the future.  I have reviewed the following labs-TSH-dated 10/30/2020-within normal limits.  Follow-up in clinic in 4 weeks or sooner if needed.  I have spent atleast 30 minutes with patient today which includes the time spent for preparing to see the patient ( e.g., review of test, records ), ordering medications and test ,psychoeducation and supportive psychotherapy and care coordination,as well as documenting clinical information in electronic health  record,interpreting and communication of test results   This note was generated in part or whole with voice recognition software. Voice recognition is usually quite accurate but there are transcription errors that can and very often do occur. I apologize for any typographical errors that were not detected and corrected.      Jomarie Longs, MD 10/31/2020, 8:29 AM

## 2020-11-13 ENCOUNTER — Ambulatory Visit (INDEPENDENT_AMBULATORY_CARE_PROVIDER_SITE_OTHER): Payer: Medicare Other | Admitting: Licensed Clinical Social Worker

## 2020-11-13 ENCOUNTER — Other Ambulatory Visit: Payer: Self-pay

## 2020-11-13 DIAGNOSIS — F422 Mixed obsessional thoughts and acts: Secondary | ICD-10-CM

## 2020-11-13 DIAGNOSIS — F4310 Post-traumatic stress disorder, unspecified: Secondary | ICD-10-CM | POA: Diagnosis not present

## 2020-11-13 NOTE — Progress Notes (Signed)
Virtual Visit via Video Note  I connected with Latasha [name] on 11/13/20 at  9:00 AM EDT by a video enabled telemedicine application and verified that I am speaking with the correct person using two identifiers.  Location: Patient: home Provider: remote office [place], [place])   I discussed the limitations of evaluation and management by telemedicine and the availability of in person appointments. The patient expressed understanding and agreed to proceed.   I discussed the assessment and treatment plan with the patient. The patient was provided an opportunity to ask questions and all were answered. The patient agreed with the plan and demonstrated an understanding of the instructions.   The patient was advised to call back or seek an in-person evaluation if the symptoms worsen or if the condition fails to improve as anticipated.  I provided 60 minutes of non-face-to-face time during this encounter.   Christina R Hussami, LCSW   THERAPIST PROGRESS NOTE  Session Time: 9-10a  Participation Level: Active  Behavioral Response: Neat and Well GroomedAlertAnxious  Type of Therapy: Individual Therapy  Treatment Goals addressed: Anxiety and Coping  Interventions: CBT, Supportive and Other: trauma focused  Summary: Latasha [name] is a 35 y.o. female who presents with continuing symptoms related to intrusive thoughts/fears about vomiting and germs. Pt reports that mood is the same and that quality and quantity of sleep is okay.  Allowed pt safe space to explore and express thoughts and feelings about her past trauma, relationships with children, identification of phobias/avoidance response, relationships with parents, overall psychological impact.   Explored past traumatic events and had pt describe incidents (hospitalizations) in detail. Allowed pt to identify anxiety triggers in the past and triggers in the present. Discussed pts experiences in the past with eating disorders and  pros/cons of eating disorder clinic. Discussed pts current relationship with food.  Discussed covid-related fears and concerns. Pt reporting that entire house got covid in Jan. Discussed feelings that pt has about touching children "they are just petri dishes full of germs". Pt will allow her parents to do certain tasks for the children so that she won't have to do them. "maybe they are enablers, I don't know".   Discussed cognitive rigidity and flexibility.  Continued recommendations are as follows: self care behaviors, positive social engagements, focusing on overall work/home/life balance, and focusing on positive physical and emotional wellness.    Suicidal/Homicidal: No  Therapist Response: Latasha [name] is trying hard to use coping skills to manage anxiety that have worked in the past and utilize in similar situation. Latasha [name] is continuing to develop behavioral and cognitive strategies to reduce or eliminate the irrational anxiety and learn and implement new strategies for realistically addressing fears or worries.These behaviors reflective of continued progress. Treatment to continue.   Plan: Return again in 3 weeks.  Diagnosis: Axis I: Obsessive Compulsive Disorder and Post Traumatic Stress Disorder    Axis II: No diagnosis    [name] Hussami, LCSW 11/13/2020

## 2020-11-29 ENCOUNTER — Telehealth (INDEPENDENT_AMBULATORY_CARE_PROVIDER_SITE_OTHER): Payer: Medicare Other | Admitting: Psychiatry

## 2020-11-29 ENCOUNTER — Other Ambulatory Visit: Payer: Self-pay

## 2020-11-29 ENCOUNTER — Encounter: Payer: Self-pay | Admitting: Psychiatry

## 2020-11-29 DIAGNOSIS — F3342 Major depressive disorder, recurrent, in full remission: Secondary | ICD-10-CM

## 2020-11-29 DIAGNOSIS — F410 Panic disorder [episodic paroxysmal anxiety] without agoraphobia: Secondary | ICD-10-CM | POA: Diagnosis not present

## 2020-11-29 DIAGNOSIS — F4310 Post-traumatic stress disorder, unspecified: Secondary | ICD-10-CM

## 2020-11-29 DIAGNOSIS — F411 Generalized anxiety disorder: Secondary | ICD-10-CM | POA: Diagnosis not present

## 2020-11-29 DIAGNOSIS — Z8659 Personal history of other mental and behavioral disorders: Secondary | ICD-10-CM

## 2020-11-29 MED ORDER — BUSPIRONE HCL 30 MG PO TABS: 30.0000 mg | ORAL_TABLET | Freq: Two times a day (BID) | ORAL | 1 refills | Status: DC

## 2020-11-29 NOTE — Progress Notes (Signed)
Virtual Visit via Video Note  I connected with Latasha [name] on 11/29/20 at  1:00 PM EDT by a video enabled telemedicine application and verified that I am speaking with the correct person using two identifiers.  Location Provider Location : [facility] Patient Location : Home  Participants: Patient , Provider   I discussed the limitations of evaluation and management by telemedicine and the availability of in person appointments. The patient expressed understanding and agreed to proceed.    I discussed the assessment and treatment plan with the patient. The patient was provided an opportunity to ask questions and all were answered. The patient agreed with the plan and demonstrated an understanding of the instructions.   The patient was advised to call back or seek an in-person evaluation if the symptoms worsen or if the condition fails to improve as anticipated.   BH MD OP Progress Note  11/29/2020 3:29 PM LATARRA EAGLETON  MRN:  [id number]  Chief Complaint:  Chief Complaint    Follow-up; Anxiety     HPI: Latasha [name] is a 35 year old Caucasian female, single on SSD, lives at [place] with her children and parents, has a history of GAD, MDD, PTSD, panic attacks, history of anorexia nervosa, Hirschsprung's disease, iron deficiency anemia, chronic constipation, vasovagal syncope, history of chronic fatigue syndrome was evaluated by telemedicine today.  Patient today reports she has noticed improvement in her depressive symptoms.  The Zoloft and the BuSpar combination does not work with that.  She reports her sadness as well as lack of motivation has improved.  She reports sleep is improved on the Ambien however she does wake up feeling groggy.  She gets 7 to 8 hours of solid sleep most nights.  She denies side effects other than feeling grogginess.  She however reports sleep at times can be interrupted since her children needs her help in the middle of the night.  Patient reports  she continues to struggle with anxiety symptoms, worries a lot about different things, has trouble relaxing, easily gets irritable at times and so on.  She continues to have panic attacks in social situations especially when she has to go to a doctor's office and so on.  She is currently working with her therapist on the same and has appointment every 2 weeks or so.  Patient denies any suicidality, homicidality or perceptual disturbances.  Patient denies any other concerns today.  Visit Diagnosis:    ICD-10-CM   1. GAD (generalized anxiety disorder)  F41.1 busPIRone (BUSPAR) 30 MG tablet  2. MDD (major depressive disorder), recurrent, in full remission (HCC)  F33.42   3. PTSD (post-traumatic stress disorder)  F43.10 busPIRone (BUSPAR) 30 MG tablet  4. Panic attacks  F41.0 busPIRone (BUSPAR) 30 MG tablet  5. History of anorexia nervosa  Z86.59     Past Psychiatric History: I have reviewed past psychiatric history from my progress note on 09/26/2020.  Past trials of Zoloft, Wellbutrin, BuSpar, Prozac, Paxil, Xanax  Past Medical History:  Past Medical History:  Diagnosis Date  . Acid reflux   . Anemia   . Anxiety   . Asthma   . Chronic constipation   . Hirschsprung disease   . Pelvic floor dysfunction   . Syncope, cardiogenic     Past Surgical History:  Procedure Laterality Date  . abdomial adhesions removed  2012  . APPENDECTOMY  1997  . bladder repair surgery  1997  . c- sections  [id number]  . enodmetiosis removed  2012  .  OVARIAN CYST REMOVAL  2003, 2012  . TONSILLECTOMY  2007    Family Psychiatric History: I have reviewed family psychiatric history from my progress note on 09/26/2020  Family History:  Family History  Problem Relation Age of Onset  . Cancer Mother   . Diabetes Mother   . Hypertension Mother   . Cancer Father   . Alcohol abuse Father   . Depression Father   . Cancer Maternal Grandfather   . Factor IX deficiency Maternal Grandfather   . Factor IX  deficiency Son   . ADD / ADHD Son   . Autism Son   . Kidney cancer Neg Hx   . Bladder Cancer Neg Hx     Social History: Reviewed social history from my progress note on 09/26/2020 Social History   Socioeconomic History  . Marital status: Single    Spouse name: Not on file  . Number of children: 2  . Years of education: 12 th grade, some college  . Highest education level: Not on file  Occupational History  . Occupation: disabled  Tobacco Use  . Smoking status: Never Smoker  . Smokeless tobacco: Never Used  Substance and Sexual Activity  . Alcohol use: No  . Drug use: No  . Sexual activity: Not on file  Other Topics Concern  . Not on file  Social History Narrative  . Not on file   Social Determinants of Health   Financial Resource Strain: Not on file  Food Insecurity: Not on file  Transportation Needs: Not on file  Physical Activity: Not on file  Stress: Not on file  Social Connections: Not on file    Allergies:  Allergies  Allergen Reactions  . Meperidine Anaphylaxis    Other reaction(s): Other (See Comments) Other Reaction: Not Assessed  . Lactose     Other reaction(s): Other (See Comments) Other Reaction: GI Upset  . Fludrocortisone Hives  . Levofloxacin Hives  . Sulfamethoxazole-Trimethoprim Other (See Comments)    Other reaction(s): Dizziness    Metabolic Disorder Labs: No results found for: HGBA1C, MPG No results found for: PROLACTIN No results found for: CHOL, TRIG, HDL, CHOLHDL, VLDL, LDLCALC Lab Results  Component Value Date   TSH 1.276 10/30/2020    Therapeutic Level Labs: No results found for: LITHIUM No results found for: VALPROATE No components found for:  CBMZ  Current Medications: Current Outpatient Medications  Medication Sig Dispense Refill  . busPIRone (BUSPAR) 30 MG tablet Take 1 tablet (30 mg total) by mouth 2 (two) times daily. 60 tablet 1  . drospirenone-ethinyl estradiol (YASMIN) 3-0.03 MG tablet Take 1 tablet by mouth  daily.    . Acidophilus Lactobacillus CAPS Take 1 tablet by mouth daily.    Marland Kitchen. albuterol (VENTOLIN HFA) 108 (90 Base) MCG/ACT inhaler Inhale 1 puff into the lungs every 6 (six) hours as needed for wheezing or shortness of breath.     . EPINEPHrine (EPIPEN 2-PAK) 0.3 mg/0.3 mL IJ SOAJ injection Inject 0.3 mg into the muscle as needed. Reported on 02/07/2016    . fluticasone (FLONASE) 50 MCG/ACT nasal spray SHAKE LQ AND U 2 SPRAYS IEN QD    . hydrOXYzine (ATARAX/VISTARIL) 25 MG tablet TK 1 T PO QD PRN    . midodrine (PROAMATINE) 10 MG tablet Take 10 mg by mouth daily.     . norethindrone-ethinyl estradiol (LOESTRIN) 1-20 MG-MCG tablet Take 1 tablet by mouth daily.    Marland Kitchen. omeprazole (PRILOSEC) 40 MG capsule 1 capsule by mouth daily (Patient not  taking: Reported on 09/26/2020)  0  . ondansetron (ZOFRAN) 4 MG tablet Take 4 mg by mouth every 8 (eight) hours as needed for nausea.    . ondansetron (ZOFRAN-ODT) 4 MG disintegrating tablet Take by mouth. (Patient not taking: No sig reported)    . Plecanatide 3 MG TABS Take 3 mg by mouth daily.  (Patient not taking: Reported on 09/26/2020)    . promethazine (PHENERGAN) 25 MG tablet Take 25 mg by mouth every 8 (eight) hours as needed for vomiting.    . sertraline (ZOLOFT) 100 MG tablet Take 1.5 tablets (150 mg total) by mouth daily. 45 tablet 1  . Sodium Phosphates (RA ENEMA) 7-19 GM/118ML ENEM Place rectally as needed (constipation).    Marland Kitchen zolpidem (AMBIEN) 5 MG tablet Take 1 tablet (5 mg total) by mouth at bedtime as needed for sleep. 12 tablet 1   No current facility-administered medications for this visit.     Musculoskeletal: Strength & Muscle Tone: UTA Gait & Station: UTA Patient leans: N/A  Psychiatric Specialty Exam: Review of Systems  Psychiatric/Behavioral: Positive for sleep disturbance. The patient is nervous/anxious.   All other systems reviewed and are negative.   There were no vitals taken for this visit.There is no height or weight on file  to calculate BMI.  General Appearance: Casual  Eye Contact:  Fair  Speech:  Clear and Coherent  Volume:  Normal  Mood:  Anxious  Affect:  Congruent  Thought Process:  Goal Directed and Descriptions of Associations: Intact  Orientation:  Full (Time, Place, and Person)  Thought Content: Logical   Suicidal Thoughts:  No  Homicidal Thoughts:  No  Memory:  Immediate;   Fair Recent;   Fair Remote;   Fair  Judgement:  Fair  Insight:  Fair  Psychomotor Activity:  Normal  Concentration:  Concentration: Fair and Attention Span: Fair  Recall:  [employer] of Knowledge: Fair  Language: Fair  Akathisia:  No  Handed:  Right  AIMS (if indicated): UTA  Assets:  Communication Skills Desire for Improvement Housing Social Support  ADL's:  Intact  Cognition: WNL  Sleep:  Improving   Screenings: GAD-7   Flowsheet Row Video Visit from 11/29/2020 in [facility] Video Visit from 09/26/2020 in [facility]  Total GAD-7 Score 13 13    PHQ2-9   Flowsheet Row Video Visit from 11/29/2020 in [facility] Video Visit from 10/30/2020 in [facility] Video Visit from 09/26/2020 in [facility]  PHQ-2 Total Score 2 5 5   PHQ-9 Total Score 8 14 14     Flowsheet Row Counselor from 11/13/2020 in [facility] Video Visit from 10/30/2020 in [facility] Counselor from 10/23/2020 in [facility]  C-SSRS RISK CATEGORY No Risk No Risk No Risk       Assessment and Plan: Latasha [name] is a 35 year old Caucasian female on disability, single, lives at [facility], has a history of depression, anxiety, eating disorder, PTSD, panic attacks, Hirschsprung's disease, chronic constipation, iron deficiency was evaluated by telemedicine today.  Patient is biologically predisposed given multiple health problems,  history of trauma, history of medical problems in her family.  Patient with psychosocial stressors of being a single mother, children with health issues and her own health problems.  She continues to struggle with anxiety although depression is improving.  Plan MDD-in remission Zoloft 150 mg p.o. daily-reduced dosage Continue BuSpar as prescribed Continue CBT  with Christina Hussami  GAD-unstable Increase BuSpar to 30 mg p.o. twice daily Zoloft 150 mg p.o. daily Continue CBT  PTSD-unstable Continue CBT Continue Zoloft Ambien 2.5-5 mg p.o. nightly as needed.  Advised patient to take half tablet as needed some nights to see if that will help with grogginess. Provided medication education.  Follow-up in clinic in 2 months or sooner if needed.  This note was generated in part or whole with voice recognition software. Voice recognition is usually quite accurate but there are transcription errors that can and very often do occur. I apologize for any typographical errors that were not detected and corrected.        Jomarie Longs, MD 11/29/2020, 3:29 PM

## 2020-12-04 ENCOUNTER — Ambulatory Visit: Payer: Medicare Other | Admitting: Licensed Clinical Social Worker

## 2020-12-04 ENCOUNTER — Other Ambulatory Visit: Payer: Self-pay

## 2020-12-13 ENCOUNTER — Other Ambulatory Visit: Payer: Self-pay

## 2020-12-13 ENCOUNTER — Ambulatory Visit (INDEPENDENT_AMBULATORY_CARE_PROVIDER_SITE_OTHER): Payer: Medicare Other | Admitting: Licensed Clinical Social Worker

## 2020-12-13 DIAGNOSIS — F422 Mixed obsessional thoughts and acts: Secondary | ICD-10-CM

## 2020-12-13 DIAGNOSIS — F4310 Post-traumatic stress disorder, unspecified: Secondary | ICD-10-CM | POA: Diagnosis not present
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
## 2020-12-28 ENCOUNTER — Ambulatory Visit (INDEPENDENT_AMBULATORY_CARE_PROVIDER_SITE_OTHER): Payer: Medicare Other | Admitting: Licensed Clinical Social Worker

## 2020-12-28 ENCOUNTER — Other Ambulatory Visit: Payer: Self-pay

## 2020-12-28 DIAGNOSIS — F4310 Post-traumatic stress disorder, unspecified: Secondary | ICD-10-CM

## 2020-12-28 DIAGNOSIS — F422 Mixed obsessional thoughts and acts: Secondary | ICD-10-CM | POA: Diagnosis not present
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
## 2021-01-01 ENCOUNTER — Other Ambulatory Visit: Payer: Medicare Other

## 2021-01-02 ENCOUNTER — Ambulatory Visit: Payer: Medicare Other | Admitting: Nurse Practitioner

## 2021-01-02 ENCOUNTER — Ambulatory Visit: Payer: Medicare Other

## 2021-01-08 ENCOUNTER — Telehealth: Payer: Medicare Other | Admitting: Psychiatry

## 2021-01-29 ENCOUNTER — Other Ambulatory Visit: Payer: Self-pay

## 2021-01-29 ENCOUNTER — Ambulatory Visit (INDEPENDENT_AMBULATORY_CARE_PROVIDER_SITE_OTHER): Payer: Medicare Other | Admitting: Licensed Clinical Social Worker

## 2021-01-29 DIAGNOSIS — F4310 Post-traumatic stress disorder, unspecified: Secondary | ICD-10-CM | POA: Diagnosis not present

## 2021-01-29 NOTE — Progress Notes (Signed)
Virtual Visit via Video Note  I connected with Latasha [name] on 01/29/21 at  9:00 AM EDT by a video enabled telemedicine application and verified that I am speaking with the correct person using two identifiers.  Location: Patient: home Provider: remote office [place], [place])   I discussed the limitations of evaluation and management by telemedicine and the availability of in person appointments. The patient expressed understanding and agreed to proceed.   I discussed the assessment and treatment plan with the patient. The patient was provided an opportunity to ask questions and all were answered. The patient agreed with the plan and demonstrated an understanding of the instructions.   The patient was advised to call back or seek an in-person evaluation if the symptoms worsen or if the condition fails to improve as anticipated.  I provided 60 minutes of non-face-to-face time during this encounter.   Irven Ingalsbe R Naiomy Watters, LCSW   THERAPIST PROGRESS NOTE  Session Time: 9-10a  Participation Level: Active  Behavioral Response: Neat and Well GroomedAlertAnxious  Type of Therapy: Individual Therapy  Treatment Goals addressed: Anxiety and Coping  Interventions: CBT, Family Systems, and Other: trauma focused  Summary: Latasha [name] is a 35 y.o. female who presents with continuing symptoms related to PTSD. Pt reports that overall mood has been fluctuating depending on the external stressors. Pt reports that she is trying hard to manage anxiety/stress as it happens. Pt reporting inconsistent quality and quantity of sleep.  Allowed pt to explore and express thoughts and feelings associated with recent life situations and external stressors. Pt reports that she is feeling more anxiety now that her parents have decided that they want to play a more passive role in the upbringing of their grandchildren. Pt states that parents said to her "we have raised our children and don't want to  raise yours". This triggers anxiety in pt because she feels she is being "abandoned" to raise her children alone. Pt states that they see their father at times "but he doesn't know what to do with our oldest son who has autism"  Pt continues to have lots of concerns about her own health and the health of her children. Continued to offer pt unconditional positive support and explored anxiety triggers and coping skills pt is currently using to manage overall symptoms.   Continued recommendations are as follows: self care behaviors, positive social engagements, focusing on overall work/home/life balance, and focusing on positive physical and emotional wellness.   Suicidal/Homicidal: No  Therapist Response:  Darianne is continuing to develop behavioral and cognitive strategies to reduce or eliminate the irrational anxiety.  Lanny is learning and implementing new strategies for realistically addressing fears or worries. Liesl is continuing to identify, challenge, and replace fearful self talk with positive, realistic, and empowering self talk. Aretha is focusing on relaxation and diversion activities to decrease levels of anxiety. These behaviors are reflective of both personal growth and steps towards progress. Treatment to continue as indicated.    Plan: Return again in 4 weeks.  Diagnosis: Axis I: Post Traumatic Stress Disorder    Axis II: No diagnosis    [name] Angelina Neece, LCSW 01/29/2021

## 2021-01-30 ENCOUNTER — Other Ambulatory Visit: Payer: Self-pay

## 2021-01-30 ENCOUNTER — Inpatient Hospital Stay: Payer: Medicare Other

## 2021-01-30 ENCOUNTER — Inpatient Hospital Stay: Payer: Medicare Other | Attending: Oncology

## 2021-01-30 ENCOUNTER — Inpatient Hospital Stay (HOSPITAL_BASED_OUTPATIENT_CLINIC_OR_DEPARTMENT_OTHER): Payer: Medicare Other | Admitting: Oncology

## 2021-01-30 ENCOUNTER — Encounter: Payer: Self-pay | Admitting: Oncology

## 2021-01-30 VITALS — BP 111/80 | HR 58 | Temp 97.1°F | Resp 16 | Wt 142.5 lb

## 2021-01-30 DIAGNOSIS — D508 Other iron deficiency anemias: Secondary | ICD-10-CM

## 2021-01-30 DIAGNOSIS — Q431 Hirschsprung's disease: Secondary | ICD-10-CM | POA: Insufficient documentation

## 2021-01-30 DIAGNOSIS — N920 Excessive and frequent menstruation with regular cycle: Secondary | ICD-10-CM | POA: Insufficient documentation

## 2021-01-30 DIAGNOSIS — D509 Iron deficiency anemia, unspecified: Secondary | ICD-10-CM | POA: Diagnosis not present

## 2021-01-30 DIAGNOSIS — K219 Gastro-esophageal reflux disease without esophagitis: Secondary | ICD-10-CM | POA: Insufficient documentation

## 2021-01-30 DIAGNOSIS — Z79899 Other long term (current) drug therapy: Secondary | ICD-10-CM | POA: Diagnosis not present

## 2021-01-30 DIAGNOSIS — R002 Palpitations: Secondary | ICD-10-CM | POA: Insufficient documentation

## 2021-01-30 LAB — CBC WITH DIFFERENTIAL/PLATELET
Abs Immature Granulocytes: 0.01 10*3/uL (ref 0.00–0.07)
Basophils Absolute: 0 10*3/uL (ref 0.0–0.1)
Basophils Relative: 1 %
Eosinophils Absolute: 0.1 10*3/uL (ref 0.0–0.5)
Eosinophils Relative: 3 %
HCT: 39.3 % (ref 36.0–46.0)
Hemoglobin: 13.2 g/dL (ref 12.0–15.0)
Immature Granulocytes: 0 %
Lymphocytes Relative: 31 %
Lymphs Abs: 1.6 10*3/uL (ref 0.7–4.0)
MCH: 31.8 pg (ref 26.0–34.0)
MCHC: 33.6 g/dL (ref 30.0–36.0)
MCV: 94.7 fL (ref 80.0–100.0)
Monocytes Absolute: 0.4 10*3/uL (ref 0.1–1.0)
Monocytes Relative: 8 %
Neutro Abs: 3 10*3/uL (ref 1.7–7.7)
Neutrophils Relative %: 57 %
Platelets: 245 10*3/uL (ref 150–400)
RBC: 4.15 MIL/uL (ref 3.87–5.11)
RDW: 11.9 % (ref 11.5–15.5)
WBC: 5.3 10*3/uL (ref 4.0–10.5)
nRBC: 0 % (ref 0.0–0.2)

## 2021-01-30 LAB — FERRITIN: Ferritin: 48 ng/mL (ref 11–307)

## 2021-01-30 NOTE — Progress Notes (Signed)
[facility]  [address] [place], [place] [address] Phone: [phone]  Fax: [phone]   Clinic Day:  01/30/2021  Referring physician: Rayetta Humphrey, MD  Chief Complaint: Latasha [name] is a 35 y.o. 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  HPI: The patient was last seen in the hematology clinic on 07/03/20.   She received Venofer x 1 dose on 10/24/20  Feels well. Has heavy menstural cycles. Last 7-10 days. Previously on birth control. Stopped d/t risk for blood clots. Both her son's have recently been diagnosed with a bleeding disorder.   Palpations are stable.  She is followed by [facility] cardiology.  Unable to tolerate iron tablets d/t constipation.  Receives 100 mg of IV Venofer d/t constipation with 200 mg dose.   She denies any headaches, dizziness or pica which are her typical symptoms when she is iron deficient.   Past Medical History:  Diagnosis Date   Acid reflux    Anemia    Anxiety    Asthma    Chronic constipation    Hirschsprung disease    Pelvic floor dysfunction    Syncope, cardiogenic     Past Surgical History:  Procedure Laterality Date   abdomial adhesions removed  2012   APPENDECTOMY  1997   bladder repair surgery  1997   c- sections  2013,2014   enodmetiosis removed  2012   OVARIAN CYST REMOVAL  2003, 2012   TONSILLECTOMY  2007    Family History  Problem Relation Age of Onset   Cancer Mother    Diabetes Mother    Hypertension Mother    Cancer Father    Alcohol abuse Father    Depression Father    Cancer Maternal Grandfather    Factor IX deficiency Maternal Grandfather    Factor IX deficiency Son    ADD / ADHD Son    Autism Son    Kidney cancer Neg Hx    Bladder Cancer Neg Hx     Social History:  reports that she has never smoked. She has never used smokeless tobacco. She reports that she does not drink alcohol and does not use drugs.  She lives in [place]. The patient is alone today.  Allergies:  Allergies  Allergen Reactions   Meperidine Anaphylaxis    Other reaction(s): Other (See Comments) Other Reaction: Not Assessed   Lactose     Other reaction(s): Other (See Comments) Other Reaction: GI Upset   Fludrocortisone Hives   Levofloxacin Hives   Sulfamethoxazole-Trimethoprim Other (See Comments)    Other reaction(s): Dizziness    Current Medications: Current Outpatient Medications  Medication Sig Dispense Refill   Acidophilus Lactobacillus CAPS Take 1 tablet by mouth daily.     albuterol (VENTOLIN HFA) 108 (90 Base) MCG/ACT inhaler Inhale 1 puff into the lungs every 6 (six) hours as needed for wheezing or shortness of breath.      busPIRone (BUSPAR) 30 MG tablet Take 1 tablet (30 mg total) by mouth 2 (two) times daily. 60 tablet 1   drospirenone-ethinyl estradiol (YASMIN) 3-0.03 MG tablet Take 1 tablet by mouth daily.     EPINEPHrine (EPIPEN 2-PAK) 0.3 mg/0.3 mL IJ SOAJ injection Inject 0.3 mg into the muscle as needed. Reported on 02/07/2016     fluticasone (FLONASE) 50 MCG/ACT nasal spray SHAKE LQ AND U 2 SPRAYS IEN QD     hydrOXYzine (ATARAX/VISTARIL) 25 MG tablet TK 1 T  PO QD PRN     midodrine (PROAMATINE) 10 MG tablet Take 10 mg by mouth daily.      norethindrone-ethinyl estradiol (LOESTRIN) 1-20 MG-MCG tablet Take 1 tablet by mouth daily.     omeprazole (PRILOSEC) 40 MG capsule   0   ondansetron (ZOFRAN) 4 MG tablet Take 4 mg by mouth every 8 (eight) hours as needed for nausea.     ondansetron (ZOFRAN-ODT) 4 MG disintegrating tablet Take by mouth.     sertraline (ZOLOFT) 100 MG tablet Take 1.5 tablets (150 mg total) by mouth daily. 45 tablet 1   Sodium Phosphates (RA ENEMA) 7-19 GM/118ML ENEM Place rectally as needed (constipation).     zolpidem (AMBIEN) 5 MG tablet Take 1 tablet (5 mg total) by mouth at bedtime as needed for sleep. 12 tablet 1   Plecanatide 3 MG TABS Take 3 mg by mouth daily.  (Patient  not taking: No sig reported)     promethazine (PHENERGAN) 25 MG tablet Take 25 mg by mouth every 8 (eight) hours as needed for vomiting. (Patient not taking: Reported on 01/30/2021)     No current facility-administered medications for this visit.    Review of Systems  Constitutional: Negative.  Negative for chills, fever, malaise/fatigue and weight loss.  HENT:  Negative for congestion, ear pain and tinnitus.   Eyes: Negative.  Negative for blurred vision and double vision.  Respiratory: Negative.  Negative for cough, sputum production and shortness of breath.   Cardiovascular:  Positive for palpitations. Negative for chest pain and leg swelling.  Gastrointestinal: Negative.  Negative for abdominal pain, constipation, diarrhea, nausea and vomiting.  Genitourinary:  Negative for dysuria, frequency and urgency.  Musculoskeletal:  Negative for back pain and falls.  Skin: Negative.  Negative for rash.  Neurological: Negative.  Negative for weakness and headaches.  Endo/Heme/Allergies: Negative.  Does not bruise/bleed easily.  Psychiatric/Behavioral: Negative.  Negative for depression. The patient is not nervous/anxious and does not have insomnia.   Performance status (ECOG):  0-1  Vital signs Blood pressure 111/80, pulse (!) 58, temperature (!) 97.1 F (36.2 C), resp. rate 16, weight 142 lb 8.4 oz (64.7 kg), SpO2 99 %.  Physical Exam Constitutional:      Appearance: She is well-developed.  HENT:     Head: Normocephalic and atraumatic.  Eyes:     Pupils: Pupils are equal, round, and reactive to light.  Cardiovascular:     Rate and Rhythm: Normal rate and regular rhythm.     Heart sounds: No murmur heard. Pulmonary:     Effort: Pulmonary effort is normal.     Breath sounds: Normal breath sounds. No wheezing.  Abdominal:     General: Bowel sounds are normal. There is no distension.     Palpations: Abdomen is soft. There is no mass.     Tenderness: There is no abdominal tenderness.   Musculoskeletal:        General: Normal range of motion.     Cervical back: Normal range of motion.  Skin:    General: Skin is warm and dry.  Neurological:     Mental Status: She is alert and oriented to person, place, and time.  Psychiatric:        Behavior: Behavior normal.   Appointment on 01/30/2021  Component Date Value Ref Range Status   WBC 01/30/2021 5.3  4.0 - 10.5 K/uL Final   RBC 01/30/2021 4.15  3.87 - 5.11 MIL/uL Final   Hemoglobin 01/30/2021 13.2  12.0 -  15.0 g/dL Final   HCT 58/52/7782 39.3  36.0 - 46.0 % Final   MCV 01/30/2021 94.7  80.0 - 100.0 fL Final   MCH 01/30/2021 31.8  26.0 - 34.0 pg Final   MCHC 01/30/2021 33.6  30.0 - 36.0 g/dL Final   RDW 42/35/3614 11.9  11.5 - 15.5 % Final   Platelets 01/30/2021 245  150 - 400 K/uL Final   nRBC 01/30/2021 0.0  0.0 - 0.2 % Final   Neutrophils Relative % 01/30/2021 57  % Final   Neutro Abs 01/30/2021 3.0  1.7 - 7.7 K/uL Final   Lymphocytes Relative 01/30/2021 31  % Final   Lymphs Abs 01/30/2021 1.6  0.7 - 4.0 K/uL Final   Monocytes Relative 01/30/2021 8  % Final   Monocytes Absolute 01/30/2021 0.4  0.1 - 1.0 K/uL Final   Eosinophils Relative 01/30/2021 3  % Final   Eosinophils Absolute 01/30/2021 0.1  0.0 - 0.5 K/uL Final   Basophils Relative 01/30/2021 1  % Final   Basophils Absolute 01/30/2021 0.0  0.0 - 0.1 K/uL Final   Immature Granulocytes 01/30/2021 0  % Final   Abs Immature Granulocytes 01/30/2021 0.01  0.00 - 0.07 K/uL Final   Performed at [facility], [address] [place], [place] [address]    Assessment:  BRENTNEY GOLDBACH is a 35 y.o. female with chronic constipation and iron deficiency anemia.  Ferritin was 3 on 10/04/2014 consistent with iron deficiency.  She denies any melena or hematochezia.  She has a history of heavy menses felt secondary to endometriosis.  Menses is now light on BCP.   EGD was normal in 08/2016.  Colonoscopy was normal in 09/2015.  She has a history of microscopic  hematuria.  CT hematuria work-up on 04/29/2017 was negative.  She is followed by urology.   Work-up on 03/08/2015 confirmed iron deficiency (ferritin 5).  B12 and folate were normal.     She has received Venofer 600 mg (03/15/2015 - 04/19/2015), x2 (08/09/2015 - 08/16/2015), x1 (10/31/2015), x2 (05/08/2016 - 05/15/2016), x2 (03/05/2017 - 03/12/2017), x2 (06/04/2017 - 06/11/2017), x3 (12/24/2017 - 01/15/2018), x 2 (06/24/2018 - 07/01/2018), x 2 (02/25/2019 - 03/04/2019), and x 3 (02/24/2020 - 03/16/2020). She receives Venofer if ferritin is < 50 with symptoms.   Ferritin has been followed: 5 on 03/08/2015, 28 on 03/29/2015, 64 on 04/26/2015, 9 on 07/26/2015, 40 on 08/23/2015, 35 on 09/20/2015 29 on 10/25/2015, 32 on 02/07/2016, 36 on 05/08/2016, 39 on 02/17/2017, 39 on 05/27/2017, 42 on 09/03/2017, 32 on 12/23/2017, 68 on 03/26/2018, 42 on 06/23/2018 102 on 09/24/2018, 32 on 12/22/2018, 20 on 02/24/2019, 70 on 08/25/2019, 36 on 11/24/2019, 17 on 02/23/2020, 76 on 05/04/2020, and 109 on 06/27/2020.   She has a son with factor IX deficiency.  Another son is being evaluated for von Willebrand's disease.  Work-up on 03/08/2015 revealed a normal platelet count, PT, PTT, von Willebrand panel, and multimers.   She has a history of microscopic hematuria.  CT hematuria workup on 04/29/2017 revealed no findings to account for the patient's microscopic hematuria. She is followed by urology. Repeat urinalysis revealed no microscopic hematuria.  The patient received the Pfizer COVID-19 vaccine on 11/05/2019 and 11/26/2019. She received the Booster shot in 06/19/2020.  Symptomatically, she feels good.  Has intermittent palpitations that are stable.  Continues to have heavy menstrual cycles.  Plan: Iron deficiency Secondary to heavy menstrual cycles. Previously she was on birth control she discontinued due to risk of blood  clots. Labs from 01/30/2021 are stable.  Ferritin is pending. Patient received IV iron if  ferritin < 30. Patient may require more frequent IV iron if menses significant. She last received IV Venofer on 10/24/2020. Patient to contact clinic if symptomatic. Continue to monitor.  Disposition: No iron today. RTC in 3 months for labs (CBC with diff, ferritin). RTC in 6 months for MD assessment, labs (CBC with diff, ferritin- day before) and +/- Venofer.  I discussed the assessment and treatment plan with the patient.  The patient was provided an opportunity to ask questions and all were answered.  The patient agreed with the plan and demonstrated an understanding of the instructions.  The patient was advised to call back if the symptoms worsen or if the condition fails to improve as anticipated.  Greater than 50% was spent in counseling and coordination of care with this patient including but not limited to discussion of the relevant topics above (See A&P) including, but not limited to diagnosis and management of acute and chronic medical conditions.   Durenda HurtJennifer Gedalia Mcmillon, NP 01/30/2021 10:44 AM

## 2021-01-31 ENCOUNTER — Other Ambulatory Visit: Payer: Self-pay

## 2021-01-31 ENCOUNTER — Encounter: Payer: Self-pay | Admitting: Psychiatry

## 2021-01-31 ENCOUNTER — Telehealth (INDEPENDENT_AMBULATORY_CARE_PROVIDER_SITE_OTHER): Payer: Medicare Other | Admitting: Psychiatry

## 2021-01-31 DIAGNOSIS — F411 Generalized anxiety disorder: Secondary | ICD-10-CM

## 2021-01-31 DIAGNOSIS — F410 Panic disorder [episodic paroxysmal anxiety] without agoraphobia: Secondary | ICD-10-CM | POA: Diagnosis not present

## 2021-01-31 DIAGNOSIS — F4310 Post-traumatic stress disorder, unspecified: Secondary | ICD-10-CM

## 2021-01-31 DIAGNOSIS — Z8659 Personal history of other mental and behavioral disorders: Secondary | ICD-10-CM

## 2021-01-31 DIAGNOSIS — F331 Major depressive disorder, recurrent, moderate: Secondary | ICD-10-CM | POA: Diagnosis not present

## 2021-01-31 MED ORDER — SERTRALINE HCL 100 MG PO TABS
150.0000 mg | ORAL_TABLET | Freq: Every day | ORAL | 1 refills | Status: DC
Start: 2021-01-31 — End: 2021-03-12

## 2021-01-31 MED ORDER — SERTRALINE HCL 25 MG PO TABS
25.0000 mg | ORAL_TABLET | Freq: Every day | ORAL | 1 refills | Status: DC
Start: 2021-01-31 — End: 2021-03-12

## 2021-01-31 MED ORDER — BUSPIRONE HCL 30 MG PO TABS: 30.0000 mg | ORAL_TABLET | Freq: Two times a day (BID) | ORAL | 1 refills | Status: DC

## 2021-01-31 NOTE — Progress Notes (Signed)
Virtual Visit via Video Note  I connected with Latasha [name] on 01/31/21 at  2:30 PM EDT by a video enabled telemedicine application and verified that I am speaking with the correct person using two identifiers.  Location Provider Location : [facility] Patient Location : Home  Participants: Patient , Provider    I discussed the limitations of evaluation and management by telemedicine and the availability of in person appointments. The patient expressed understanding and agreed to proceed.   I discussed the assessment and treatment plan with the patient. The patient was provided an opportunity to ask questions and all were answered. The patient agreed with the plan and demonstrated an understanding of the instructions.   The patient was advised to call back or seek an in-person evaluation if the symptoms worsen or if the condition fails to improve as anticipated.                                                                    BH MD OP Progress Note  01/31/2021 5:53 PM Latasha [name]  MRN:  [id number]  Chief Complaint:  Chief Complaint   Follow-up; Anxiety; Depression    HPI: Latasha [name] is a 35 year old Caucasian female, single, on SSD, lives at [place] with her children and parents, has a history of MDD, GAD, PTSD, panic attacks, history of anorexia nervosa, Hirschsprung's disease, iron deficiency anemia, chronic constipation, vasovagal syncope, history of chronic fatigue syndrome was evaluated by telemedicine today.  Patient today reports she is currently making progress.  She reports she did have a few psychosocial stressors recently with her son being diagnosed with a bleeding disorder.  That did bring back a lot of memories of her trauma from the past.  She reports however her symptoms as getting better now.  She continues to be in therapy.  She reports that Zoloft is helpful.  She is agreeable to increasing the dosage. Patient reports she is compliant on the medications.   Denies side effects.  She reports sleep is improving.  She denies any suicidality, homicidality or perceptual disturbances.  Reports appetite is fair.  Denies any eating disorder symptoms.  Patient denies any other concerns today.  Visit Diagnosis:    ICD-10-CM   1. GAD (generalized anxiety disorder)  F41.1 sertraline (ZOLOFT) 100 MG tablet    sertraline (ZOLOFT) 25 MG tablet    busPIRone (BUSPAR) 30 MG tablet    2. MDD (major depressive disorder), recurrent episode, moderate (HCC)  F33.1 sertraline (ZOLOFT) 100 MG tablet    3. PTSD (post-traumatic stress disorder)  F43.10 sertraline (ZOLOFT) 100 MG tablet    sertraline (ZOLOFT) 25 MG tablet    busPIRone (BUSPAR) 30 MG tablet    4. Panic attacks  F41.0 sertraline (ZOLOFT) 100 MG tablet    sertraline (ZOLOFT) 25 MG tablet    busPIRone (BUSPAR) 30 MG tablet    5. History of anorexia nervosa  Z86.59       Past Psychiatric History: Reviewed past psychiatric history from progress note on 09/26/2020.  Past trials of Zoloft, Wellbutrin, BuSpar, Prozac, Paxil, Xanax  Past Medical History:  Past Medical History:  Diagnosis Date   Acid reflux    Anemia    Anxiety    Asthma  Chronic constipation    Hirschsprung disease    Pelvic floor dysfunction    Syncope, cardiogenic     Past Surgical History:  Procedure Laterality Date   abdomial adhesions removed  2012   APPENDECTOMY  1997   bladder repair surgery  1997   c- sections  2013,2014   enodmetiosis removed  2012   OVARIAN CYST REMOVAL  2003, 2012   TONSILLECTOMY  2007    Family Psychiatric History: Reviewed family psychiatric history from progress note on 09/26/2020  Family History:  Family History  Problem Relation Age of Onset   Cancer Mother    Diabetes Mother    Hypertension Mother    Cancer Father    Alcohol abuse Father    Depression Father    Cancer Maternal Grandfather    Factor IX deficiency Maternal Grandfather    Factor IX deficiency Son    ADD /  ADHD Son    Autism Son    Kidney cancer Neg Hx    Bladder Cancer Neg Hx     Social History: Reviewed social history from progress note on 09/26/2020 Social History   Socioeconomic History   Marital status: Single    Spouse name: Not on file   Number of children: 2   Years of education: 12 th grade, some college   Highest education level: Not on file  Occupational History   Occupation: disabled  Tobacco Use   Smoking status: Never   Smokeless tobacco: Never  Substance and Sexual Activity   Alcohol use: No   Drug use: No   Sexual activity: Not on file  Other Topics Concern   Not on file  Social History Narrative   Not on file   Social Determinants of Health   Financial Resource Strain: Not on file  Food Insecurity: Not on file  Transportation Needs: Not on file  Physical Activity: Not on file  Stress: Not on file  Social Connections: Not on file    Allergies:  Allergies  Allergen Reactions   Meperidine Anaphylaxis    Other reaction(s): Other (See Comments) Other Reaction: Not Assessed   Lactose     Other reaction(s): Other (See Comments) Other Reaction: GI Upset   Fludrocortisone Hives   Levofloxacin Hives   Sulfamethoxazole-Trimethoprim Other (See Comments)    Other reaction(s): Dizziness    Metabolic Disorder Labs: No results found for: HGBA1C, MPG No results found for: PROLACTIN No results found for: CHOL, TRIG, HDL, CHOLHDL, VLDL, LDLCALC Lab Results  Component Value Date   TSH 1.276 10/30/2020    Therapeutic Level Labs: No results found for: LITHIUM No results found for: VALPROATE No components found for:  CBMZ  Current Medications: Current Outpatient Medications  Medication Sig Dispense Refill   sertraline (ZOLOFT) 25 MG tablet Take 1 tablet (25 mg total) by mouth daily. Take along with 150 mg daily - total of 175 mg daily 30 tablet 1   Acidophilus Lactobacillus CAPS Take 1 tablet by mouth daily.     albuterol (VENTOLIN HFA) 108 (90 Base)  MCG/ACT inhaler Inhale 1 puff into the lungs every 6 (six) hours as needed for wheezing or shortness of breath.      busPIRone (BUSPAR) 30 MG tablet Take 1 tablet (30 mg total) by mouth 2 (two) times daily. 60 tablet 1   drospirenone-ethinyl estradiol (YASMIN) 3-0.03 MG tablet Take 1 tablet by mouth daily.     EPINEPHrine (EPIPEN 2-PAK) 0.3 mg/0.3 mL IJ SOAJ injection Inject 0.3 mg into the  muscle as needed. Reported on 02/07/2016     fluticasone (FLONASE) 50 MCG/ACT nasal spray SHAKE LQ AND U 2 SPRAYS IEN QD     hydrOXYzine (ATARAX/VISTARIL) 25 MG tablet TK 1 T PO QD PRN     midodrine (PROAMATINE) 10 MG tablet Take 10 mg by mouth daily.      norethindrone-ethinyl estradiol (LOESTRIN) 1-20 MG-MCG tablet Take 1 tablet by mouth daily.     omeprazole (PRILOSEC) 40 MG capsule   0   ondansetron (ZOFRAN) 4 MG tablet Take 4 mg by mouth every 8 (eight) hours as needed for nausea.     ondansetron (ZOFRAN-ODT) 4 MG disintegrating tablet Take by mouth.     Plecanatide 3 MG TABS Take 3 mg by mouth daily.  (Patient not taking: No sig reported)     promethazine (PHENERGAN) 25 MG tablet Take 25 mg by mouth every 8 (eight) hours as needed for vomiting. (Patient not taking: Reported on 01/30/2021)     sertraline (ZOLOFT) 100 MG tablet Take 1.5 tablets (150 mg total) by mouth daily. 45 tablet 1   Sodium Phosphates (RA ENEMA) 7-19 GM/118ML ENEM Place rectally as needed (constipation).     zolpidem (AMBIEN) 5 MG tablet Take 1 tablet (5 mg total) by mouth at bedtime as needed for sleep. 12 tablet 1   No current facility-administered medications for this visit.     Musculoskeletal: Strength & Muscle Tone:  uta Gait & Station:  UTA Patient leans: N/A  Psychiatric Specialty Exam: Review of Systems  Psychiatric/Behavioral:  The patient is nervous/anxious.   All other systems reviewed and are negative.  There were no vitals taken for this visit.There is no height or weight on file to calculate BMI.  General  Appearance: Casual  Eye Contact:  Fair  Speech:  Clear and Coherent  Volume:  Normal  Mood:  Anxious  Affect:  Congruent  Thought Process:  Goal Directed and Descriptions of Associations: Intact  Orientation:  Full (Time, Place, and Person)  Thought Content: Logical   Suicidal Thoughts:  No  Homicidal Thoughts:  No  Memory:  Immediate;   Fair Recent;   Fair Remote;   Fair  Judgement:  Fair  Insight:  Fair  Psychomotor Activity:  Normal  Concentration:  Concentration: Fair and Attention Span: Fair  Recall:  [employer] of Knowledge: Fair  Language: Fair  Akathisia:  No  Handed:  Right  AIMS (if indicated): not done  Assets:  Communication Skills Desire for Improvement Housing Social Support  ADL's:  Intact  Cognition: WNL  Sleep:  Fair   Screenings: GAD-7    Flowsheet Row Video Visit from 01/31/2021 in [facility] Video Visit from 11/29/2020 in [facility] Video Visit from 09/26/2020 in [facility]  Total GAD-7 Score 10 13 13       PHQ2-9    Flowsheet Row Video Visit from 01/31/2021 in [facility] Counselor from 01/29/2021 in [facility] Video Visit from 11/29/2020 in [facility] Video Visit from 10/30/2020 in [facility] Video Visit from 09/26/2020 in [facility]  PHQ-2 Total Score 0 2 2 5 5   PHQ-9 Total Score -- 10 8 14 14       Flowsheet Row Video Visit from 01/31/2021 in [facility] Counselor from 01/29/2021 in [facility] Counselor from 12/28/2020 in [facility]  C-SSRS RISK CATEGORY No Risk No Risk No  Risk        Assessment and Plan: Latasha HandingChelsea E [name] is a 35 year old Caucasian female on disability, single, lives at home [place], has a history of depression,  anxiety, eating disorder, PTSD, panic attacks, Hirschsprung's disease, chronic constipation, iron deficiency anemia was evaluated by telemedicine today.  Patient with recent psychosocial stressors which triggered anxiety and PTSD symptoms, will benefit from medication readjustment.  Plan GAD-improving BuSpar 30 mg p.o. twice daily Zoloft as prescribed Continue CBT  PTSD-unstable Will increase Zoloft to to 175 mg p.o. daily Continue CBT Ambien 2.5-5 mg p.o. nightly as needed   MDD in remission Zoloft and BuSpar as prescribed  Follow-up in clinic in 4 to 5 weeks or sooner if needed.  This note was generated in part or whole with voice recognition software. Voice recognition is usually quite accurate but there are transcription errors that can and very often do occur. I apologize for any typographical errors that were not detected and corrected.       Jomarie LongsSaramma Ramesh Moan, MD 02/01/2021, 6:35 PM

## 2021-01-31 NOTE — Progress Notes (Deleted)
error 

## 2021-03-06 ENCOUNTER — Ambulatory Visit (INDEPENDENT_AMBULATORY_CARE_PROVIDER_SITE_OTHER): Payer: Medicare Other | Admitting: Licensed Clinical Social Worker

## 2021-03-06 ENCOUNTER — Other Ambulatory Visit: Payer: Self-pay

## 2021-03-06 DIAGNOSIS — F422 Mixed obsessional thoughts and acts: Secondary | ICD-10-CM

## 2021-03-06 DIAGNOSIS — Z8659 Personal history of other mental and behavioral disorders: Secondary | ICD-10-CM | POA: Diagnosis not present

## 2021-03-06 DIAGNOSIS — F4310 Post-traumatic stress disorder, unspecified: Secondary | ICD-10-CM | POA: Diagnosis not present

## 2021-03-06 NOTE — Progress Notes (Signed)
Virtual Visit via Video Note  I connected with Latasha [name] on 03/06/21 at  1:00 PM EDT by a video enabled telemedicine application and verified that I am speaking with the correct person using two identifiers.  Location: Patient: home Provider: remote office [place], [place])   I discussed the limitations of evaluation and management by telemedicine and the availability of in person appointments. The patient expressed understanding and agreed to proceed.   I discussed the assessment and treatment plan with the patient. The patient was provided an opportunity to ask questions and all were answered. The patient agreed with the plan and demonstrated an understanding of the instructions.   The patient was advised to call back or seek an in-person evaluation if the symptoms worsen or if the condition fails to improve as anticipated.  I provided 60 minutes of non-face-to-face time during this encounter.   Keltin Baird R Ginia Rudell, LCSW   THERAPIST PROGRESS NOTE  Session Time: 1-2p  Participation Level: Active  Behavioral Response: Neat and Well GroomedAlertAnxious  Type of Therapy: Individual Therapy  Treatment Goals addressed: Anxiety and Diagnosis: PTSD  Interventions: CBT and Other: behavior modification  Summary: Latasha [name] is a 35 y.o. female who presents with continuing symptoms related to depression, anxiety, PTSD, and history of eating disorder. Patient reports that overall mood has been fluctuating, and that quality and quantity of sleep has been fluctuating.   Allowed patient safe space to explore and express thoughts and feelings associated with recent external stressors and life events. Patient reports that she is continuing to deal with her own anxiety and stress associated with vomiting--patient son is continuing to have cyclical vomiting. Patient reports that whenever someone in her family has a vomiting episode, she will often go three days and not eat food.  Patient states that this behavior is due to her fear of throwing up.   Patient continues to have fears about hospital visits, and doctors visits. Patient reports that she did have two incidents with both of her children involving hospitals, and had to face her fears and be with her children during these times period patient reports that she felt very disconnected throughout the hospital visits. Explored this feeling, and associated it with patience previous trauma. Allowed patient to explore trauma from the past and try to examine how present behaviors are being fueled by past trauma.   Patient reports that she recently got an X-ray of her coccyx, and it is broken. Patient was referred to get X-ray by her physical therapist. Patient reports it's been four weeks since the injury happened, and she's still having some discomfort.   Discussed trauma therapies, including emdr. Patient reports that she will do some independent research and we will discuss at next session..  Continued recommendations are as follows: self care behaviors, positive social engagements, focusing on overall work/home/life balance, and focusing on positive physical and emotional wellness.     Suicidal/Homicidal: No  Therapist Response: Escalation of symptoms--pt progress fluctuating/intermittent. Reviewed coping skills and other interventions including behavior modification. Treatment to continue as indicated.   Plan: Return again in 4 weeks.  Diagnosis: Axis I: PTSD; history of anorexia; mixed obsessional thoughts and acts    Axis II: No diagnosis    [name] Jamesmichael Shadd, LCSW 03/06/2021

## 2021-03-12 ENCOUNTER — Other Ambulatory Visit: Payer: Self-pay

## 2021-03-12 ENCOUNTER — Encounter: Payer: Self-pay | Admitting: Psychiatry

## 2021-03-12 ENCOUNTER — Telehealth (INDEPENDENT_AMBULATORY_CARE_PROVIDER_SITE_OTHER): Payer: Medicare Other | Admitting: Psychiatry

## 2021-03-12 DIAGNOSIS — F4310 Post-traumatic stress disorder, unspecified: Secondary | ICD-10-CM | POA: Diagnosis not present

## 2021-03-12 DIAGNOSIS — F331 Major depressive disorder, recurrent, moderate: Secondary | ICD-10-CM

## 2021-03-12 DIAGNOSIS — F411 Generalized anxiety disorder: Secondary | ICD-10-CM | POA: Diagnosis not present

## 2021-03-12 DIAGNOSIS — G4700 Insomnia, unspecified: Secondary | ICD-10-CM

## 2021-03-12 DIAGNOSIS — F5000 Anorexia nervosa, unspecified: Secondary | ICD-10-CM

## 2021-03-12 MED ORDER — ZOLPIDEM TARTRATE 5 MG PO TABS
5.0000 mg | ORAL_TABLET | Freq: Every evening | ORAL | 1 refills | Status: DC | PRN
Start: 2021-03-12 — End: 2021-09-12

## 2021-03-12 MED ORDER — SERTRALINE HCL 100 MG PO TABS: 200.0000 mg | ORAL_TABLET | Freq: Every day | ORAL | 1 refills | Status: DC

## 2021-03-12 MED ORDER — BUSPIRONE HCL 30 MG PO TABS
30.0000 mg | ORAL_TABLET | Freq: Two times a day (BID) | ORAL | 1 refills | Status: DC
Start: 2021-03-12 — End: 2021-04-24

## 2021-03-12 NOTE — Progress Notes (Signed)
Virtual Visit via Video Note  I connected with Latasha [name] on 03/12/21 at  1:00 PM EDT by a video enabled telemedicine application and verified that I am speaking with the correct person using two identifiers.  Location Provider Location : [facility] Patient Location : Home  Participants: Patient , Provider   I discussed the limitations of evaluation and management by telemedicine and the availability of in person appointments. The patient expressed understanding and agreed to proceed.    I discussed the assessment and treatment plan with the patient. The patient was provided an opportunity to ask questions and all were answered. The patient agreed with the plan and demonstrated an understanding of the instructions.   The patient was advised to call back or seek an in-person evaluation if the symptoms worsen or if the condition fails to improve as anticipated.   BH MD OP Progress Note  03/12/2021 1:51 PM Latasha [name]  MRN:  [id number]  Chief Complaint:  Chief Complaint   Follow-up; Anxiety    HPI: Latasha [name] is a 35 year old Caucasian female, single, on SSD, [facility], lives with her children and her parents, has a history of MDD, GAD, history of anorexia nervosa, Hirschsprung's disease, iron deficiency anemia, chronic constipation, vasovagal syncope, history of chronic fatigue syndrome was evaluated by telemedicine today.  Patient today reports she is currently struggling with trauma related symptoms.  Her son who is 34 years old was recently diagnosed with a bleeding disorder as well as possible cyclical vomiting.  She reports they are trying different treatment trials for herself which triggers her memories of her own trauma from the past.  She hence has episodes of having a lot of anxiety, sleep problems.  She reports she finds herself dissociate in situations when she has to face her child's health problems.  Her therapist is currently working with her on the  same.  She may start EMDR therapy soon.  Patient reports sleep is disrupted since her son needs her help in the middle of the night.  This goes through cycles.  Last night she did sleep well.  The Ambien does help and she uses half tablet as needed.  Patient reports 2 weeks ago she went through an episode of restricting food for 1-2 .  She reports she was worried that she might get sick with a stomach bug or something when her son started vomiting.  She hence did not eat anything for 24 to 48 hours and just water since she was worried she may also get sick.  She denies any compensatory behaviors.  Patient denies any suicidality or homicidality.  Patient denies any perceptual disturbances.  Patient denies any other concerns today.  Visit Diagnosis:    ICD-10-CM   1. GAD (generalized anxiety disorder)  F41.1 sertraline (ZOLOFT) 100 MG tablet    busPIRone (BUSPAR) 30 MG tablet    2. MDD (major depressive disorder), recurrent episode, moderate (HCC)  F33.1 sertraline (ZOLOFT) 100 MG tablet    3. PTSD (post-traumatic stress disorder)  F43.10 sertraline (ZOLOFT) 100 MG tablet    zolpidem (AMBIEN) 5 MG tablet    busPIRone (BUSPAR) 30 MG tablet    4. Anorexia nervosa  F50.00     5. Insomnia, unspecified type  G47.00       Past Psychiatric History: I have reviewed past psychiatric history from progress note on 09/26/2020.  Past trials of Zoloft, Wellbutrin, BuSpar, Prozac, Paxil, Xanax  Past Medical History:  Past Medical History:  Diagnosis Date   Acid reflux    Anemia    Anxiety    Asthma    Chronic constipation    Hirschsprung disease    Pelvic floor dysfunction    Syncope, cardiogenic     Past Surgical History:  Procedure Laterality Date   abdomial adhesions removed  2012   APPENDECTOMY  1997   bladder repair surgery  1997   c- sections  2013,2014   enodmetiosis removed  2012   OVARIAN CYST REMOVAL  2003, 2012   TONSILLECTOMY  2007    Family Psychiatric History: I have  reviewed family psychiatric history from progress note on 09/26/2020  Family History:  Family History  Problem Relation Age of Onset   Cancer Mother    Diabetes Mother    Hypertension Mother    Cancer Father    Alcohol abuse Father    Depression Father    Cancer Maternal Grandfather    Factor IX deficiency Maternal Grandfather    Factor IX deficiency Son    ADD / ADHD Son    Autism Son    Kidney cancer Neg Hx    Bladder Cancer Neg Hx     Social History: Reviewed social history from progress note on 09/26/2020 Social History   Socioeconomic History   Marital status: Single    Spouse name: Not on file   Number of children: 2   Years of education: 12 th grade, some college   Highest education level: Not on file  Occupational History   Occupation: disabled  Tobacco Use   Smoking status: Never   Smokeless tobacco: Never  Substance and Sexual Activity   Alcohol use: No   Drug use: No   Sexual activity: Not on file  Other Topics Concern   Not on file  Social History Narrative   Not on file   Social Determinants of Health   Financial Resource Strain: Not on file  Food Insecurity: Not on file  Transportation Needs: Not on file  Physical Activity: Not on file  Stress: Not on file  Social Connections: Not on file    Allergies:  Allergies  Allergen Reactions   Meperidine Anaphylaxis    Other reaction(s): Other (See Comments) Other Reaction: Not Assessed   Lactose     Other reaction(s): Other (See Comments) Other Reaction: GI Upset   Fludrocortisone Hives   Levofloxacin Hives   Sulfamethoxazole-Trimethoprim Other (See Comments)    Other reaction(s): Dizziness    Metabolic Disorder Labs: No results found for: HGBA1C, MPG No results found for: PROLACTIN No results found for: CHOL, TRIG, HDL, CHOLHDL, VLDL, LDLCALC Lab Results  Component Value Date   TSH 1.276 10/30/2020    Therapeutic Level Labs: No results found for: LITHIUM No results found for:  VALPROATE No components found for:  CBMZ  Current Medications: Current Outpatient Medications  Medication Sig Dispense Refill   Acidophilus Lactobacillus CAPS Take 1 tablet by mouth daily.     albuterol (VENTOLIN HFA) 108 (90 Base) MCG/ACT inhaler Inhale 1 puff into the lungs every 6 (six) hours as needed for wheezing or shortness of breath.      drospirenone-ethinyl estradiol (YASMIN) 3-0.03 MG tablet Take 1 tablet by mouth daily.     EPINEPHrine (EPIPEN 2-PAK) 0.3 mg/0.3 mL IJ SOAJ injection Inject 0.3 mg into the muscle as needed. Reported on 02/07/2016     fluticasone (FLONASE) 50 MCG/ACT nasal spray SHAKE LQ AND U 2 SPRAYS IEN QD     hydrOXYzine (ATARAX/VISTARIL)  25 MG tablet TK 1 T PO QD PRN     midodrine (PROAMATINE) 10 MG tablet Take 10 mg by mouth daily.      omeprazole (PRILOSEC) 40 MG capsule   0   ondansetron (ZOFRAN) 4 MG tablet Take 4 mg by mouth every 8 (eight) hours as needed for nausea.     promethazine (PHENERGAN) 25 MG tablet Take 25 mg by mouth every 8 (eight) hours as needed for vomiting.     promethazine (PHENERGAN) 25 MG tablet Take by mouth.     sertraline (ZOLOFT) 100 MG tablet Take 2 tablets (200 mg total) by mouth daily. 60 tablet 1   Sodium Phosphates (RA ENEMA) 7-19 GM/118ML ENEM Place rectally as needed (constipation).     busPIRone (BUSPAR) 30 MG tablet Take 1 tablet (30 mg total) by mouth 2 (two) times daily. 60 tablet 1   norethindrone-ethinyl estradiol (LOESTRIN) 1-20 MG-MCG tablet Take 1 tablet by mouth daily. (Patient not taking: Reported on 03/12/2021)     ondansetron (ZOFRAN-ODT) 4 MG disintegrating tablet Take by mouth. (Patient not taking: Reported on 03/12/2021)     Plecanatide 3 MG TABS Take 3 mg by mouth daily.  (Patient not taking: No sig reported)     zolpidem (AMBIEN) 5 MG tablet Take 1 tablet (5 mg total) by mouth at bedtime as needed for sleep. 30 tablet 1   No current facility-administered medications for this visit.      Musculoskeletal: Strength & Muscle Tone:  UTA Gait & Station:  UTA Patient leans: N/A  Psychiatric Specialty Exam: Review of Systems  Psychiatric/Behavioral:  Positive for sleep disturbance. The patient is nervous/anxious.   All other systems reviewed and are negative.  There were no vitals taken for this visit.There is no height or weight on file to calculate BMI.  General Appearance: Casual  Eye Contact:  Fair  Speech:  Clear and Coherent  Volume:  Normal  Mood:  Anxious  Affect:  Congruent  Thought Process:  Goal Directed and Descriptions of Associations: Intact  Orientation:  Full (Time, Place, and Person)  Thought Content: Logical   Suicidal Thoughts:  No  Homicidal Thoughts:  No  Memory:  Immediate;   Fair Recent;   Fair Remote;   Fair  Judgement:  Fair  Insight:  Fair  Psychomotor Activity:  Normal  Concentration:  Concentration: Fair and Attention Span: Fair  Recall:  [employer] of Knowledge: Fair  Language: Fair  Akathisia:  No  Handed:  Right  AIMS (if indicated): not done  Assets:  Communication Skills Desire for Improvement Social Support Talents/Skills Transportation  ADL's:  Intact  Cognition: WNL  Sleep:  Poor   Screenings: GAD-7    Flowsheet Row Video Visit from 01/31/2021 in [facility] Video Visit from 11/29/2020 in [facility] Video Visit from 09/26/2020 in [facility]  Total GAD-7 Score 10 13 13       PHQ2-9    Flowsheet Row Video Visit from 03/12/2021 in [facility] Counselor from 03/06/2021 in [facility] Video Visit from 01/31/2021 in [facility] Counselor from 01/29/2021 in [facility] Video Visit from 11/29/2020 in [facility]  PHQ-2 Total Score 0 1 0 2 2  PHQ-9 Total Score 7 -- -- 10 8      Flowsheet Row  Video Visit from 03/12/2021 in [facility] Counselor from 03/06/2021 in [facility] Video Visit  from 01/31/2021 in [facility]  C-SSRS RISK CATEGORY No Risk No Risk No Risk        Assessment and Plan: Latasha [name] is a 35 year old Caucasian female on disability, single, lives at [facility] has a history of depression, anxiety, PTSD was evaluated by telemedicine today. Patient with recent psychosocial stressors of her child's health problems which does have an impact on her mood, patient with possible eating disorder with recent worsening, she will benefit from the following plan.  Plan GAD-improving BuSpar 30 mg p.o. twice daily Continue CBT with Ms. Christina Hussami  PTSD-unstable Increase Zoloft to 200 mg p.o. daily Continue CBT  MDD in remission Zoloft and BuSpar as prescribed  Anorexia nervosa-unstable Patient to continue psychotherapy sessions Continue Zoloft as prescribed  Insomnia-unspecified-unstable Discussed sleep hygiene techniques. Advised to take an extra dosage of Ambien 2.5 mg if she wakes up in the middle of the night.  She could take a total of Ambien 5 mg at night as needed  We will coordinate care with Ms. Christina Hussami  Follow-up in clinic in 8 weeks or sooner if needed.  In person.  This note was generated in part or whole with voice recognition software. Voice recognition is usually quite accurate but there are transcription errors that can and very often do occur. I apologize for any typographical errors that were not detected and corrected.         Jomarie Longs, MD 03/12/2021, 1:51 PM

## 2021-04-09 ENCOUNTER — Other Ambulatory Visit: Payer: Self-pay

## 2021-04-09 ENCOUNTER — Ambulatory Visit: Payer: Medicare Other | Admitting: Licensed Clinical Social Worker

## 2021-04-11 ENCOUNTER — Other Ambulatory Visit: Payer: Self-pay

## 2021-04-11 ENCOUNTER — Ambulatory Visit (INDEPENDENT_AMBULATORY_CARE_PROVIDER_SITE_OTHER): Payer: Medicare Other | Admitting: Licensed Clinical Social Worker

## 2021-04-11 DIAGNOSIS — F4310 Post-traumatic stress disorder, unspecified: Secondary | ICD-10-CM

## 2021-04-11 DIAGNOSIS — F422 Mixed obsessional thoughts and acts: Secondary | ICD-10-CM | POA: Diagnosis not present

## 2021-04-11 NOTE — Progress Notes (Signed)
Virtual Visit via Video Note  I connected with Latasha [name] on 04/11/21 at  1:00 PM EDT by a video enabled telemedicine application and verified that I am speaking with the correct person using two identifiers.  Location: Patient: home Provider: [facility]   I discussed the limitations of evaluation and management by telemedicine and the availability of in person appointments. The patient expressed understanding and agreed to proceed.  I discussed the assessment and treatment plan with the patient. The patient was provided an opportunity to ask questions and all were answered. The patient agreed with the plan and demonstrated an understanding of the instructions.   The patient was advised to call back or seek an in-person evaluation if the symptoms worsen or if the condition fails to improve as anticipated.  I provided 60 minutes of non-face-to-face time during this encounter.   Latasha Wojdyla R Fayola Meckes, LCSW   THERAPIST PROGRESS NOTE  Session Time: 1-2p  Participation Level: Active  Behavioral Response: Neat and Well GroomedAlertAnxious  Type of Therapy: Individual Therapy  Treatment Goals addressed: Anxiety  Interventions: CBT, Solution Focused, and Reframing  Summary: Latasha [name] is a 35 y.o. female who presents with improving symptoms related to PTSD diagnosis. Pt reports that things have been going "terrible" at start of session. Pt then reports that both of her children have been sick, which is a trigger for her. Discussed planning for worst case scenario instead of allowing uncertainty trigger anxiety. Brainstormed through different scenarios that pt could try to help pt prepare ahead of time for child illnesses. Pt reports that this helps her feel more in control. Discussed parent control and child control and importance of choices with children to help them gain independence skills.  Continued recommendations are as follows: self care behaviors, positive social engagements,  focusing on overall work/home/life balance, and focusing on positive physical and emotional wellness.    Suicidal/Homicidal: No  Therapist Response: Latasha [name] is continuing to develop behavioral and cognitive strategies to reduce or eliminate the irrational anxiety.  Latasha [name] is learning and implementing new strategies for realistically addressing fears or worries. Latasha [name] is continuing to identify, challenge, and replace fearful self talk with positive, realistic, and empowering self talk. Latasha [name] is focusing on relaxation and diversion activities to decrease levels of anxiety. These behaviors are reflective of both personal growth and steps towards progress. Treatment to continue as indicated.  Plan: Return again in 4 weeks.  Diagnosis: Axis I: Post Traumatic Stress Disorder; mixed obsessional thoughts and acts    Axis II: No diagnosis    Latasha [name] Latasha Unrein, LCSW 04/11/2021

## 2021-04-24 ENCOUNTER — Encounter: Payer: Self-pay | Admitting: Psychiatry

## 2021-04-24 ENCOUNTER — Ambulatory Visit (INDEPENDENT_AMBULATORY_CARE_PROVIDER_SITE_OTHER): Payer: Medicare Other | Admitting: Psychiatry

## 2021-04-24 ENCOUNTER — Other Ambulatory Visit: Payer: Self-pay

## 2021-04-24 VITALS — BP 119/77 | Temp 98.0°F | Ht 65.25 in | Wt 138.0 lb

## 2021-04-24 DIAGNOSIS — F411 Generalized anxiety disorder: Secondary | ICD-10-CM | POA: Diagnosis not present

## 2021-04-24 DIAGNOSIS — F4310 Post-traumatic stress disorder, unspecified: Secondary | ICD-10-CM | POA: Diagnosis not present

## 2021-04-24 DIAGNOSIS — F331 Major depressive disorder, recurrent, moderate: Secondary | ICD-10-CM

## 2021-04-24 DIAGNOSIS — F3342 Major depressive disorder, recurrent, in full remission: Secondary | ICD-10-CM | POA: Diagnosis not present

## 2021-04-24 DIAGNOSIS — G4700 Insomnia, unspecified: Secondary | ICD-10-CM | POA: Insufficient documentation

## 2021-04-24 DIAGNOSIS — F5000 Anorexia nervosa, unspecified: Secondary | ICD-10-CM

## 2021-04-24 MED ORDER — BUSPIRONE HCL 30 MG PO TABS
30.0000 mg | ORAL_TABLET | Freq: Two times a day (BID) | ORAL | 1 refills | Status: DC
Start: 2021-04-24 — End: 2021-07-09

## 2021-04-24 MED ORDER — SERTRALINE HCL 100 MG PO TABS: 200.0000 mg | ORAL_TABLET | Freq: Every day | ORAL | 1 refills | Status: DC

## 2021-04-24 NOTE — Progress Notes (Signed)
BH MD OP Progress Note  04/24/2021 3:25 PM AVAEH EWER  MRN:  [id number]  Chief Complaint:  Chief Complaint   Follow-up; Anxiety; Eating Disorder; Insomnia    HPI: Latasha [name] is a 35 year old Caucasian female, single, on SSD, lives in [place], lives with her children, her parents, has a history of MDD, GAD, history of anorexia nervosa,Hirschprings disease, iron deficiency anemia, chronic constipation, vasovagal syncope, history of chronic fatigue syndrome was evaluated in office today.  Patient today reports since being on the higher dosage of Zoloft she has noticed improvement in her mood.  She is compliant on the medication.  Denies side effects.  She reports she is currently taking half tablet of Ambien if she wakes up in the middle of the night and that has been helpful with sleep.  She continues to have bruxism, forgets to wear her mouth guard.  Patient continues to have appetite problems, does have a history of anorexia, however has been eating enough for herself, forces herself to eat, 3 meals a day although smaller portions.  She does struggle with low energy, does have iron deficiency.  Patient reports she is due for iron infusions soon.  Patient denies any suicidality, homicidality or perceptual disturbances.  She continues to follow-up with therapist and reports therapy sessions are beneficial.  Patient denies any other concerns today.  Visit Diagnosis:    ICD-10-CM   1. GAD (generalized anxiety disorder)  F41.1 sertraline (ZOLOFT) 100 MG tablet    busPIRone (BUSPAR) 30 MG tablet    2. MDD (major depressive disorder), recurrent, in full remission (HCC)  F33.42 sertraline (ZOLOFT) 100 MG tablet    3. PTSD (post-traumatic stress disorder)  F43.10 sertraline (ZOLOFT) 100 MG tablet    busPIRone (BUSPAR) 30 MG tablet    4. Anorexia nervosa  F50.00     5. Insomnia, unspecified type  G47.00       Past Psychiatric History: Reviewed past psychiatric history from  progress note on  09/26/2020.  Past trials of Zoloft, Wellbutrin, BuSpar, Prozac, Paxil, Xanax  Past Medical History:  Past Medical History:  Diagnosis Date   Acid reflux    Anemia    Anxiety    Asthma    Chronic constipation    Hirschsprung disease    Pelvic floor dysfunction    Syncope, cardiogenic     Past Surgical History:  Procedure Laterality Date   abdomial adhesions removed  2012   APPENDECTOMY  1997   bladder repair surgery  1997   c- sections  2013,2014   enodmetiosis removed  2012   OVARIAN CYST REMOVAL  2003, 2012   TONSILLECTOMY  2007    Family Psychiatric History: Reviewed family psychiatric history from progress note on 09/26/2020  Family History:  Family History  Problem Relation Age of Onset   Cancer Mother    Diabetes Mother    Hypertension Mother    Cancer Father    Alcohol abuse Father    Depression Father    Cancer Maternal Grandfather    Factor IX deficiency Maternal Grandfather    Factor IX deficiency Son    ADD / ADHD Son    Autism Son    Kidney cancer Neg Hx    Bladder Cancer Neg Hx     Social History: Reviewed social history from progress note on 09/26/2020 Social History   Socioeconomic History   Marital status: Single    Spouse name: Not on file   Number of children: 2  Years of education: 12 th grade, some college   Highest education level: Not on file  Occupational History   Occupation: disabled  Tobacco Use   Smoking status: Never   Smokeless tobacco: Never  Substance and Sexual Activity   Alcohol use: No   Drug use: No   Sexual activity: Yes    Partners: Male    Birth control/protection: Condom, Pill  Other Topics Concern   Not on file  Social History Narrative   Not on file   Social Determinants of Health   Financial Resource Strain: Not on file  Food Insecurity: Not on file  Transportation Needs: Not on file  Physical Activity: Not on file  Stress: Not on file  Social Connections: Not on file    Allergies:   Allergies  Allergen Reactions   Meperidine Anaphylaxis    Other reaction(s): Other (See Comments) Other Reaction: Not Assessed   Lactose     Other reaction(s): Other (See Comments) Other Reaction: GI Upset   Fludrocortisone Hives   Levofloxacin Hives   Sulfamethoxazole-Trimethoprim Other (See Comments)    Other reaction(s): Dizziness    Metabolic Disorder Labs: No results found for: HGBA1C, MPG No results found for: PROLACTIN No results found for: CHOL, TRIG, HDL, CHOLHDL, VLDL, LDLCALC Lab Results  Component Value Date   TSH 1.276 10/30/2020    Therapeutic Level Labs: No results found for: LITHIUM No results found for: VALPROATE No components found for:  CBMZ  Current Medications: Current Outpatient Medications  Medication Sig Dispense Refill   Acidophilus Lactobacillus CAPS Take 1 tablet by mouth daily.     albuterol (VENTOLIN HFA) 108 (90 Base) MCG/ACT inhaler Inhale 1 puff into the lungs every 6 (six) hours as needed for wheezing or shortness of breath.      busPIRone (BUSPAR) 30 MG tablet Take 1 tablet (30 mg total) by mouth 2 (two) times daily. 60 tablet 1   drospirenone-ethinyl estradiol (YASMIN) 3-0.03 MG tablet Take 1 tablet by mouth daily.     EPINEPHrine (EPIPEN 2-PAK) 0.3 mg/0.3 mL IJ SOAJ injection Inject 0.3 mg into the muscle as needed. Reported on 02/07/2016     fluticasone (FLONASE) 50 MCG/ACT nasal spray SHAKE LQ AND U 2 SPRAYS IEN QD     hydrOXYzine (ATARAX/VISTARIL) 25 MG tablet TK 1 T PO QD PRN     midodrine (PROAMATINE) 10 MG tablet Take 10 mg by mouth daily.      norethindrone-ethinyl estradiol (LOESTRIN) 1-20 MG-MCG tablet Take 1 tablet by mouth daily. (Patient not taking: Reported on 03/12/2021)     omeprazole (PRILOSEC) 40 MG capsule   0   ondansetron (ZOFRAN) 4 MG tablet Take 4 mg by mouth every 8 (eight) hours as needed for nausea.     ondansetron (ZOFRAN-ODT) 4 MG disintegrating tablet Take by mouth. (Patient not taking: Reported on 03/12/2021)      Plecanatide 3 MG TABS Take 3 mg by mouth daily.  (Patient not taking: No sig reported)     promethazine (PHENERGAN) 25 MG tablet Take 25 mg by mouth every 8 (eight) hours as needed for vomiting.     promethazine (PHENERGAN) 25 MG tablet Take by mouth.     sertraline (ZOLOFT) 100 MG tablet Take 2 tablets (200 mg total) by mouth daily. 60 tablet 1   Sodium Phosphates (RA ENEMA) 7-19 GM/118ML ENEM Place rectally as needed (constipation).     zolpidem (AMBIEN) 5 MG tablet Take 1 tablet (5 mg total) by mouth at bedtime as needed  for sleep. 30 tablet 1   No current facility-administered medications for this visit.     Musculoskeletal: Strength & Muscle Tone: within normal limits Gait & Station: normal Patient leans: N/A  Psychiatric Specialty Exam: Review of Systems  Constitutional:  Positive for fatigue.  Psychiatric/Behavioral:  The patient is nervous/anxious.   All other systems reviewed and are negative.  Blood pressure 119/77, temperature 98 F (36.7 C), height 5' 5.25" (1.657 m), weight 138 lb (62.6 kg), SpO2 99 %.Body mass index is 22.79 kg/m.  General Appearance: Casual  Eye Contact:  Fair  Speech:  Clear and Coherent  Volume:  Normal  Mood:  Anxious  Affect:  Congruent  Thought Process:  Goal Directed and Descriptions of Associations: Intact  Orientation:  Full (Time, Place, and Person)  Thought Content: Logical   Suicidal Thoughts:  No  Homicidal Thoughts:  No  Memory:  Immediate;   Fair Recent;   Fair Remote;   Fair  Judgement:  Fair  Insight:  Fair  Psychomotor Activity:  Normal  Concentration:  Concentration: Fair and Attention Span: Fair  Recall:  Good  Fund of Knowledge: Good  Language: Fair  Akathisia:  No  Handed:  Right  AIMS (if indicated): done  Assets:  Communication Skills Desire for Improvement Housing Social Support Talents/Skills Transportation Vocational/Educational  ADL's:  Intact  Cognition: WNL  Sleep:   improving    Screenings: GAD-7    Flowsheet Row Video Visit from 01/31/2021 in [facility] Video Visit from 11/29/2020 in [facility] Video Visit from 09/26/2020 in [facility]  Total GAD-7 Score 10 13 13       PHQ2-9    Flowsheet Row Office Visit from 04/24/2021 in [facility] Video Visit from 03/12/2021 in [facility] Counselor from 03/06/2021 in [facility] Video Visit from 01/31/2021 in [facility] Counselor from 01/29/2021 in [facility]  PHQ-2 Total Score 2 0 1 0 2  PHQ-9 Total Score 10 7 -- -- 10      Flowsheet Row Office Visit from 04/24/2021 in [facility] Counselor from 04/11/2021 in [facility] Video Visit from 03/12/2021 in [facility]  C-SSRS RISK CATEGORY No Risk No Risk No Risk        Assessment and Plan: Latasha [name] is a 35 year old Caucasian female on disability, single, lives at [facility], has a history of depression, anxiety, PTSD was evaluated in office today.  Patient with psychosocial stressors of her child's health problems, is currently making progress.  Plan as noted below.  Plan GAD-improving BuSpar 30 mg p.o. twice daily Continue CBT with Ms. Christina Hussami  PTSD-improving Zoloft 200 mg p.o. daily Continue CBT  MDD in remission Zoloft and BuSpar as prescribed  Anorexia nervosa-improving Continue CBT Zoloft 200 mg p.o. daily  Insomnia unspecified-improving Ambien 5 mg p.o. nightly as needed Patient advised to wear a mouth guard for her bruxism.  We will coordinate care with Ms. Christina Hussami   Follow-up in clinic in 3 to 4 weeks or sooner if needed.  This note was generated in part or whole with voice recognition software. Voice  recognition is usually quite accurate but there are transcription errors that can and very often do occur. I apologize for any typographical errors that were not detected and corrected.       [facility], MD 04/25/2021, 11:00 AM

## 2021-05-01 ENCOUNTER — Inpatient Hospital Stay: Payer: Medicare Other | Attending: Nurse Practitioner

## 2021-05-01 ENCOUNTER — Other Ambulatory Visit: Payer: Self-pay

## 2021-05-01 DIAGNOSIS — Z79899 Other long term (current) drug therapy: Secondary | ICD-10-CM | POA: Insufficient documentation

## 2021-05-01 DIAGNOSIS — D509 Iron deficiency anemia, unspecified: Secondary | ICD-10-CM | POA: Insufficient documentation

## 2021-05-01 LAB — CBC WITH DIFFERENTIAL/PLATELET
Abs Immature Granulocytes: 0.02 10*3/uL (ref 0.00–0.07)
Basophils Absolute: 0 10*3/uL (ref 0.0–0.1)
Basophils Relative: 1 %
Eosinophils Absolute: 0.1 10*3/uL (ref 0.0–0.5)
Eosinophils Relative: 2 %
HCT: 36.8 % (ref 36.0–46.0)
Hemoglobin: 12.6 g/dL (ref 12.0–15.0)
Immature Granulocytes: 0 %
Lymphocytes Relative: 27 %
Lymphs Abs: 1.7 10*3/uL (ref 0.7–4.0)
MCH: 32.4 pg (ref 26.0–34.0)
MCHC: 34.2 g/dL (ref 30.0–36.0)
MCV: 94.6 fL (ref 80.0–100.0)
Monocytes Absolute: 0.4 10*3/uL (ref 0.1–1.0)
Monocytes Relative: 7 %
Neutro Abs: 3.9 10*3/uL (ref 1.7–7.7)
Neutrophils Relative %: 63 %
Platelets: 219 10*3/uL (ref 150–400)
RBC: 3.89 MIL/uL (ref 3.87–5.11)
RDW: 12.1 % (ref 11.5–15.5)
WBC: 6.2 10*3/uL (ref 4.0–10.5)
nRBC: 0 % (ref 0.0–0.2)

## 2021-05-01 LAB — FERRITIN: Ferritin: 26 ng/mL (ref 11–307)

## 2021-05-02 ENCOUNTER — Ambulatory Visit: Payer: Medicare Other

## 2021-05-02 NOTE — Progress Notes (Signed)
Latasha [name], this patient needs 3 doses of IV Venofer please.  Her ferritin is below 30.  Can we get her scheduled for this?  Durenda Hurt, NP 05/02/2021 9:16 AM

## 2021-05-02 NOTE — Progress Notes (Signed)
Pt is scheduled for three doses of Venofer per Boneta Lucks. Pt is informed of future appointments.

## 2021-05-09 ENCOUNTER — Other Ambulatory Visit: Payer: Self-pay

## 2021-05-09 ENCOUNTER — Inpatient Hospital Stay: Payer: Medicare Other

## 2021-05-09 VITALS — BP 110/62 | HR 66 | Resp 18

## 2021-05-09 DIAGNOSIS — D509 Iron deficiency anemia, unspecified: Secondary | ICD-10-CM | POA: Diagnosis not present

## 2021-05-09 DIAGNOSIS — D508 Other iron deficiency anemias: Secondary | ICD-10-CM

## 2021-05-09 MED ORDER — SODIUM CHLORIDE 0.9 % IV SOLN: 100.0000 mg | Freq: Once | INTRAVENOUS | Status: DC

## 2021-05-09 MED ORDER — IRON SUCROSE 20 MG/ML IV SOLN
100.0000 mg | Freq: Once | INTRAVENOUS | Status: AC
  Administered 2021-05-09: 100 mg via INTRAVENOUS
  Filled 2021-05-09: qty 5

## 2021-05-09 MED ORDER — SODIUM CHLORIDE 0.9 % IV SOLN
INTRAVENOUS | Status: AC
  Filled 2021-05-09: qty 250

## 2021-05-11 ENCOUNTER — Inpatient Hospital Stay: Payer: Medicare Other

## 2021-05-11 ENCOUNTER — Other Ambulatory Visit: Payer: Self-pay

## 2021-05-11 VITALS — BP 115/65 | HR 70 | Resp 18

## 2021-05-11 DIAGNOSIS — D509 Iron deficiency anemia, unspecified: Secondary | ICD-10-CM | POA: Diagnosis not present

## 2021-05-11 DIAGNOSIS — D508 Other iron deficiency anemias: Secondary | ICD-10-CM

## 2021-05-11 MED ORDER — SODIUM CHLORIDE 0.9 % IV SOLN: 100.0000 mg | Freq: Once | INTRAVENOUS | Status: DC

## 2021-05-11 MED ORDER — SODIUM CHLORIDE 0.9 % IV SOLN
INTRAVENOUS | Status: AC
  Filled 2021-05-11: qty 250

## 2021-05-11 MED ORDER — IRON SUCROSE 20 MG/ML IV SOLN
100.0000 mg | Freq: Once | INTRAVENOUS | Status: AC
Start: 2021-05-11 — End: 2021-05-11
  Administered 2021-05-11: 100 mg via INTRAVENOUS
  Filled 2021-05-11: qty 5

## 2021-05-16 ENCOUNTER — Inpatient Hospital Stay: Payer: Medicare Other

## 2021-05-16 ENCOUNTER — Other Ambulatory Visit: Payer: Self-pay

## 2021-05-16 ENCOUNTER — Ambulatory Visit (INDEPENDENT_AMBULATORY_CARE_PROVIDER_SITE_OTHER): Payer: Medicare Other | Admitting: Licensed Clinical Social Worker

## 2021-05-16 VITALS — BP 108/74 | HR 56 | Temp 97.0°F | Resp 18

## 2021-05-16 DIAGNOSIS — D509 Iron deficiency anemia, unspecified: Secondary | ICD-10-CM

## 2021-05-16 DIAGNOSIS — F5000 Anorexia nervosa, unspecified: Secondary | ICD-10-CM

## 2021-05-16 DIAGNOSIS — F4310 Post-traumatic stress disorder, unspecified: Secondary | ICD-10-CM | POA: Diagnosis not present

## 2021-05-16 DIAGNOSIS — F422 Mixed obsessional thoughts and acts: Secondary | ICD-10-CM | POA: Diagnosis not present

## 2021-05-16 DIAGNOSIS — D508 Other iron deficiency anemias: Secondary | ICD-10-CM

## 2021-05-16 MED ORDER — SODIUM CHLORIDE 0.9 % IV SOLN
INTRAVENOUS | Status: AC
  Filled 2021-05-16 (×2): qty 250

## 2021-05-16 MED ORDER — SODIUM CHLORIDE 0.9 % IV SOLN: 100.0000 mg | Freq: Once | INTRAVENOUS | Status: DC

## 2021-05-16 MED ORDER — IRON SUCROSE 20 MG/ML IV SOLN
100.0000 mg | Freq: Once | INTRAVENOUS | Status: AC
  Administered 2021-05-16: 100 mg via INTRAVENOUS

## 2021-05-16 NOTE — Progress Notes (Signed)
Virtual Visit via Video Note  I connected with Latasha [name] on 05/16/21 at  1:00 PM EDT by a video enabled telemedicine application and verified that I am speaking with the correct person using two identifiers.  Location: Patient: home Provider: [facility]   I discussed the limitations of evaluation and management by telemedicine and the availability of in person appointments. The patient expressed understanding and agreed to proceed.   I discussed the assessment and treatment plan with the patient. The patient was provided an opportunity to ask questions and all were answered. The patient agreed with the plan and demonstrated an understanding of the instructions.   The patient was advised to call back or seek an in-person evaluation if the symptoms worsen or if the condition fails to improve as anticipated.  I provided 30 minutes of non-face-to-face time during this encounter.   Artie Mcintyre R Vishal Sandlin, LCSW   THERAPIST PROGRESS NOTE  Session Time: 1-1:30p  Participation Level: Active  Behavioral Response: Neat and Well GroomedAlertAnxious  Type of Therapy: Individual Therapy  Treatment Goals addressed: Anxiety  Interventions: CBT  Summary: Latasha [name] is a 35 y.o. female who presents with symptoms consistent with PTSD.  Pt reports that overall mood is stable and that she is trying hard to use her coping skills to manage anxiety and stress better.   Allowed pt safe space to explore thoughts and feelings associated with stressors and life events. Pt reports that she is continuing to focus on illnesses within self and her children. Pt reports that she thinks about germs 75% of the time.   Discussed coping skills and pt feels that she still has a lot of hesitations about activity and engaging in public. Reviewed coping skills and discussed IOP as an intensive "refresher" for pt. Pt gives verbal consent to discuss w/ Dr. Elna Breslow then refer to IOP.   Continued recommendations are  as follows: self care behaviors, positive social engagements, focusing on overall work/home/life balance, and focusing on positive physical and emotional wellness. .   Suicidal/Homicidal: No  Therapist Response: Progress fluctuating/intermittent at time of session. Referring for IOP.  Plan: Return again in 4 weeks.  Diagnosis: Axis I: PTSD, anorexia, mixed obsessional thoughts and acts    Axis II: No diagnosis    [name] Emaley Applin, LCSW 05/16/2021

## 2021-05-18 ENCOUNTER — Telehealth (HOSPITAL_COMMUNITY): Payer: Self-pay | Admitting: Psychiatry

## 2021-05-18 NOTE — Plan of Care (Signed)
  Problem: Decrease depressive symptoms and improve levels of effective functioning Goal: LTG: Reduce frequency, intensity, and duration of depression symptoms as evidenced by: SSB input needed on appropriate metric Outcome: Not Progressing Goal: STG: Maisy WILL PARTICIPATE IN AT LEAST 80% OF SCHEDULED INDIVIDUAL PSYCHOTHERAPY SESSIONS Outcome: Progressing   Problem: Reduce the negative impact trauma related symptoms have on social, occupational, and family functioning. Goal: LTG: Reduce frequency, intensity, and duration of PTSD symptoms so daily functioning is improved: Input needed on appropriate metric.  pt self report Outcome: Not Progressing Goal: STG: Shene WILL EXPERIENCE A 80% REDUCTION IN EXAGGERATED BELIEFS ABOUT SELF AND OTHERS THAT INTERFERE WITH TRAUMA RESOLUTION AS EVIDENCED BY SELF-REPORT Outcome: Not Progressing

## 2021-05-18 NOTE — Telephone Encounter (Signed)
D: Christina Hussami, LCSW referred pt to [facility]-IOP.  A:  Placed call to pt, but there was no answer.  Left number for pt to call the case manager back.  Inform Christina.

## 2021-06-19 ENCOUNTER — Other Ambulatory Visit: Payer: Self-pay

## 2021-06-19 ENCOUNTER — Ambulatory Visit (INDEPENDENT_AMBULATORY_CARE_PROVIDER_SITE_OTHER): Payer: Medicare Other | Admitting: Licensed Clinical Social Worker

## 2021-06-19 DIAGNOSIS — F4310 Post-traumatic stress disorder, unspecified: Secondary | ICD-10-CM | POA: Diagnosis not present

## 2021-06-19 DIAGNOSIS — F411 Generalized anxiety disorder: Secondary | ICD-10-CM

## 2021-06-19 DIAGNOSIS — F422 Mixed obsessional thoughts and acts: Secondary | ICD-10-CM

## 2021-06-19 NOTE — Plan of Care (Signed)
  Problem: Decrease depressive symptoms and improve levels of effective functioning Goal: LTG: Reduce frequency, intensity, and duration of depression symptoms as evidenced by: SSB input needed on appropriate metric Outcome: Progressing Note: Pt reports improved mood--fluctuation at times Goal: STG: Latasha [name] WILL PARTICIPATE IN AT LEAST 80% OF SCHEDULED INDIVIDUAL PSYCHOTHERAPY SESSIONS Note: Pt on time and engaged throughout session Intervention: REVIEW PLEASE SKILLS (TREAT PHYSICAL ILLNESS, BALANCE EATING, AVOID MOOD-ALTERING SUBSTANCES, BALANCE SLEEP AND GET EXERCISE) WITH Latasha [name] Intervention: Identify necessary consults and/or referrals   Problem: Reduce the negative impact trauma related symptoms have on social, occupational, and family functioning. Goal: LTG: Reduce frequency, intensity, and duration of PTSD symptoms so daily functioning is improved: Input needed on appropriate metric.  pt self report Outcome: Progressing Note: Pt feels that she is improving and is experiencing fewer anxiety/panic moments Goal: STG: Latasha [name] WILL EXPERIENCE A 80% REDUCTION IN EXAGGERATED BELIEFS ABOUT SELF AND OTHERS THAT INTERFERE WITH TRAUMA RESOLUTION AS EVIDENCED BY SELF-REPORT Outcome: Progressing Note: Pt is doing well at identifying cognitive distortions and/or exaggerated beliefs Intervention: Educate patient on: Stress management Intervention: Assess emotional status and coping mechanisms Intervention: Assist with coping skills and behavior Intervention: Assess history of psychiatric trauma

## 2021-06-19 NOTE — Progress Notes (Signed)
Virtual Visit via Video Note  I connected with Latasha [name] on 06/19/21 at 11:00 AM EDT by a video enabled telemedicine application and verified that I am speaking with the correct person using two identifiers.  Location: Patient: home Provider: remote office [place], [place])   I discussed the limitations of evaluation and management by telemedicine and the availability of in person appointments. The patient expressed understanding and agreed to proceed.   I discussed the assessment and treatment plan with the patient. The patient was provided an opportunity to ask questions and all were answered. The patient agreed with the plan and demonstrated an understanding of the instructions.   The patient was advised to call back or seek an in-person evaluation if the symptoms worsen or if the condition fails to improve as anticipated.  I provided 35 minutes of non-face-to-face time during this encounter.   Sahithi Ordoyne R Angellynn Kimberlin, LCSW   THERAPIST PROGRESS NOTE  Session Time: 11-1135a  Participation Level: Active  Behavioral Response: Neat and Well GroomedAlertAnxious  Type of Therapy: Individual Therapy  Treatment Goals addressed:  Goal: LTG: Reduce frequency, intensity, and duration of PTSD symptoms so daily functioning is improved: Input needed on appropriate metric.  pt self report Outcome: Progressing Note: Pt feels that she is improving and is experiencing fewer anxiety/panic moments Goal: STG: Elleigh WILL EXPERIENCE A 80% REDUCTION IN EXAGGERATED BELIEFS ABOUT SELF AND OTHERS THAT INTERFERE WITH TRAUMA RESOLUTION AS EVIDENCED BY SELF-REPORT Goal: LTG: Reduce frequency, intensity, and duration of depression symptoms as evidenced by: SSB input needed on appropriate metric Outcome: Progressing Note: Pt reports improved mood--fluctuation at times Goal: STG: Marvelyn WILL PARTICIPATE IN AT LEAST 80% OF SCHEDULED INDIVIDUAL PSYCHOTHERAPY SESSIONS Note: Pt on time and engaged  throughout session Interventions:  Intervention: REVIEW PLEASE SKILLS (TREAT PHYSICAL ILLNESS, BALANCE EATING, AVOID MOOD-ALTERING SUBSTANCES, BALANCE SLEEP AND GET EXERCISE) WITH Sana Intervention: Identify necessary consults and/or referrals Intervention: Educate patient on: Stress management Intervention: Assess emotional status and coping mechanisms Intervention: Assist with coping skills and behavior Intervention: Assess history of psychiatric trauma   Summary: Latasha [name] is a 35 y.o. female who presents with continuing symptoms related to PTSD diagnosis.  Pt reports that herself and her children continue to have significant health-related concerns and son recently had surgery to cauterize vessels in his nose. Pt states that both children have been sick with fevers and coughing--including FluA. Pt reports that she feels the illnesses are impacting her overall sleep Reviewed current coping skills and encouraged self care time. Pt does feel like she is progressing.   Continued recommendations are as follows: self care behaviors, positive social engagements, focusing on overall work/home/life balance, and focusing on positive physical and emotional wellness. .   Suicidal/Homicidal: No  Therapist Response: Pt is continuing to apply interventions learned in session into daily life situations. Pt is currently on track to meet goals utilizing interventions mentioned above. Personal growth and progress noted. Treatment to continue as indicated.    Plan: Return again in 4 weeks.  Diagnosis: Axis I: PTSD, mixed obsessional thoughts and acts, GAD    Axis II: No diagnosis    [name] Rasheda Ledger, LCSW 06/19/2021

## 2021-06-26 ENCOUNTER — Ambulatory Visit: Payer: Medicare Other | Admitting: Psychiatry

## 2021-07-09 ENCOUNTER — Other Ambulatory Visit: Payer: Self-pay

## 2021-07-09 ENCOUNTER — Telehealth (INDEPENDENT_AMBULATORY_CARE_PROVIDER_SITE_OTHER): Payer: Medicare Other | Admitting: Psychiatry

## 2021-07-09 ENCOUNTER — Encounter: Payer: Self-pay | Admitting: Psychiatry

## 2021-07-09 DIAGNOSIS — F4310 Post-traumatic stress disorder, unspecified: Secondary | ICD-10-CM | POA: Diagnosis not present

## 2021-07-09 DIAGNOSIS — F411 Generalized anxiety disorder: Secondary | ICD-10-CM | POA: Diagnosis not present

## 2021-07-09 DIAGNOSIS — F3342 Major depressive disorder, recurrent, in full remission: Secondary | ICD-10-CM | POA: Diagnosis not present

## 2021-07-09 DIAGNOSIS — F5101 Primary insomnia: Secondary | ICD-10-CM

## 2021-07-09 DIAGNOSIS — F5000 Anorexia nervosa, unspecified: Secondary | ICD-10-CM

## 2021-07-09 MED ORDER — BUSPIRONE HCL 30 MG PO TABS: 30.0000 mg | ORAL_TABLET | Freq: Two times a day (BID) | ORAL | 1 refills | Status: DC

## 2021-07-09 MED ORDER — HYDROXYZINE HCL 25 MG PO TABS: 25.0000 mg | ORAL_TABLET | Freq: Two times a day (BID) | ORAL | 1 refills | Status: DC | PRN

## 2021-07-09 MED ORDER — SERTRALINE HCL 100 MG PO TABS: 200.0000 mg | ORAL_TABLET | Freq: Every day | ORAL | 1 refills | Status: DC

## 2021-07-09 NOTE — Progress Notes (Signed)
BH MD OP Progress Note  07/09/2021 4:47 PM Latasha [name]  MRN:  [id number]  Chief Complaint:  Chief Complaint   Follow-up; Anxiety    HPI: Latasha [name] is a 35 year old Caucasian female, single on SSD, lives in [place], has a history of MDD, GAD, history of anorexia nervosa, Hirschsprung's disease, iron deficiency anemia, chronic constipation, vasovagal syncope, history of chronic fatigue syndrome was evaluated by telemedicine today.  Patient today reports she recently had flulike symptoms, likely had the flu since her son tested positive.  She reports she continues to recover from the same although she feels much better now.  Patient reports overall she is doing well with regards to her mood.  The combination of Zoloft and BuSpar is effective.  She does have episodes when she feels overwhelmed, she has been trying to use her coping techniques.  She continues to follow-up with her therapist and reports she feels she needs therapy more than anything else.  She is interested in having hydroxyzine as needed when she needs it.  She however has not been using it much.  Patient reports when she had the flu her sleep got disrupted due to aches and pain all over her body.  She reports the sleep is much better now and last night she slept around 8 hours.  She does have Ambien which she uses every other day or so.  Patient denies any suicidality, homicidality or perceptual disturbances.  Patient reports she has been eating 3 meals per day, denies any restrictive behaviors, denies any binging.  Patient denies any other concerns today.  Visit Diagnosis:    ICD-10-CM   1. GAD (generalized anxiety disorder)  F41.1 busPIRone (BUSPAR) 30 MG tablet    sertraline (ZOLOFT) 100 MG tablet    hydrOXYzine (ATARAX/VISTARIL) 25 MG tablet    2. MDD (major depressive disorder), recurrent, in full remission (HCC)  F33.42 sertraline (ZOLOFT) 100 MG tablet    3. PTSD (post-traumatic stress disorder)   F43.10 busPIRone (BUSPAR) 30 MG tablet    sertraline (ZOLOFT) 100 MG tablet    4. Anorexia nervosa  F50.00     5. Primary insomnia  F51.01       Past Psychiatric History: Reviewed past psychiatric history from progress note on 09/26/2020.  Past trials of Zoloft, Wellbutrin, BuSpar, Prozac, Paxil, Xanax  Past Medical History:  Past Medical History:  Diagnosis Date   Acid reflux    Anemia    Anxiety    Asthma    Chronic constipation    Hirschsprung disease    Pelvic floor dysfunction    Syncope, cardiogenic     Past Surgical History:  Procedure Laterality Date   abdomial adhesions removed  2012   APPENDECTOMY  1997   bladder repair surgery  1997   c- sections  2013,2014   enodmetiosis removed  2012   OVARIAN CYST REMOVAL  2003, 2012   TONSILLECTOMY  2007    Family Psychiatric History: Reviewed family psychiatric history from progress note on 09/26/2020  Family History:  Family History  Problem Relation Age of Onset   Cancer Mother    Diabetes Mother    Hypertension Mother    Cancer Father    Alcohol abuse Father    Depression Father    Cancer Maternal Grandfather    Factor IX deficiency Maternal Grandfather    Factor IX deficiency Son    ADD / ADHD Son    Autism Son    Kidney cancer Neg Hx  Bladder Cancer Neg Hx     Social History: Reviewed social history from progress note on 09/26/2020 Social History   Socioeconomic History   Marital status: Single    Spouse name: Not on file   Number of children: 2   Years of education: 12 th grade, some college   Highest education level: Not on file  Occupational History   Occupation: disabled  Tobacco Use   Smoking status: Never   Smokeless tobacco: Never  Substance and Sexual Activity   Alcohol use: No   Drug use: No   Sexual activity: Yes    Partners: Male    Birth control/protection: Condom, Pill  Other Topics Concern   Not on file  Social History Narrative   Not on file   Social Determinants of Health    Financial Resource Strain: Not on file  Food Insecurity: Not on file  Transportation Needs: Not on file  Physical Activity: Not on file  Stress: Not on file  Social Connections: Not on file    Allergies:  Allergies  Allergen Reactions   Meperidine Anaphylaxis    Other reaction(s): Other (See Comments) Other Reaction: Not Assessed   Lactose     Other reaction(s): Other (See Comments) Other Reaction: GI Upset   Fludrocortisone Hives   Levofloxacin Hives   Sulfamethoxazole-Trimethoprim Other (See Comments)    Other reaction(s): Dizziness    Metabolic Disorder Labs: No results found for: HGBA1C, MPG No results found for: PROLACTIN No results found for: CHOL, TRIG, HDL, CHOLHDL, VLDL, LDLCALC Lab Results  Component Value Date   TSH 1.276 10/30/2020    Therapeutic Level Labs: No results found for: LITHIUM No results found for: VALPROATE No components found for:  CBMZ  Current Medications: Current Outpatient Medications  Medication Sig Dispense Refill   Acidophilus Lactobacillus CAPS Take 1 tablet by mouth daily.     albuterol (VENTOLIN HFA) 108 (90 Base) MCG/ACT inhaler Inhale 1 puff into the lungs every 6 (six) hours as needed for wheezing or shortness of breath.      busPIRone (BUSPAR) 30 MG tablet Take 1 tablet (30 mg total) by mouth 2 (two) times daily. 60 tablet 1   drospirenone-ethinyl estradiol (YASMIN) 3-0.03 MG tablet Take 1 tablet by mouth daily.     EPINEPHrine (EPIPEN 2-PAK) 0.3 mg/0.3 mL IJ SOAJ injection Inject 0.3 mg into the muscle as needed. Reported on 02/07/2016     fluticasone (FLONASE) 50 MCG/ACT nasal spray SHAKE LQ AND U 2 SPRAYS IEN QD     hydrOXYzine (ATARAX/VISTARIL) 25 MG tablet Take 1 tablet (25 mg total) by mouth 2 (two) times daily as needed. Severe anxiety attacks only 60 tablet 1   midodrine (PROAMATINE) 10 MG tablet Take 10 mg by mouth daily.      norethindrone-ethinyl estradiol (LOESTRIN) 1-20 MG-MCG tablet Take 1 tablet by mouth daily.  (Patient not taking: Reported on 03/12/2021)     omeprazole (PRILOSEC) 40 MG capsule   0   ondansetron (ZOFRAN) 4 MG tablet Take 4 mg by mouth every 8 (eight) hours as needed for nausea.     ondansetron (ZOFRAN-ODT) 4 MG disintegrating tablet Take by mouth. (Patient not taking: Reported on 03/12/2021)     Plecanatide 3 MG TABS Take 3 mg by mouth daily.  (Patient not taking: No sig reported)     promethazine (PHENERGAN) 25 MG tablet Take 25 mg by mouth every 8 (eight) hours as needed for vomiting.     promethazine (PHENERGAN) 25 MG tablet Take  by mouth.     sertraline (ZOLOFT) 100 MG tablet Take 2 tablets (200 mg total) by mouth daily. 60 tablet 1   Sodium Phosphates (RA ENEMA) 7-19 GM/118ML ENEM Place rectally as needed (constipation).     zolpidem (AMBIEN) 5 MG tablet Take 1 tablet (5 mg total) by mouth at bedtime as needed for sleep. 30 tablet 1   No current facility-administered medications for this visit.   Facility-Administered Medications Ordered in Other Visits  Medication Dose Route Frequency Provider Last Rate Last Admin   0.9 %  sodium chloride infusion   Intravenous Continuous Creig Hines, MD   Stopped at 05/09/21 1055   0.9 %  sodium chloride infusion   Intravenous Continuous Mauro Kaufmann, NP   Stopped at 05/11/21 1051   0.9 %  sodium chloride infusion   Intravenous Continuous Creig Hines, MD 10 mL/hr at 05/16/21 1015 New Bag at 05/16/21 1015     Musculoskeletal: Strength & Muscle Tone:  UTA Gait & Station:  Seated Patient leans: N/A  Psychiatric Specialty Exam: Review of Systems  Constitutional:  Positive for fatigue.  Psychiatric/Behavioral:  The patient is nervous/anxious.   All other systems reviewed and are negative.  There were no vitals taken for this visit.There is no height or weight on file to calculate BMI.  General Appearance: Casual  Eye Contact:  Fair  Speech:  Clear and Coherent  Volume:  Normal  Mood:  Anxious improving  Affect:  Congruent   Thought Process:  Goal Directed and Descriptions of Associations: Intact  Orientation:  Full (Time, Place, and Person)  Thought Content: Logical   Suicidal Thoughts:  No  Homicidal Thoughts:  No  Memory:  Immediate;   Fair Recent;   Fair Remote;   Fair  Judgement:  Fair  Insight:  Fair  Psychomotor Activity:  Normal  Concentration:  Concentration: Fair and Attention Span: Fair  Recall:  [employer] of Knowledge: Fair  Language: Fair  Akathisia:  No  Handed:  Right  AIMS (if indicated): done, 0  Assets:  Communication Skills Desire for Improvement Social Support Transportation  ADL's:  Intact  Cognition: WNL  Sleep:  Fair   Screenings: GAD-7    Flowsheet Row Video Visit from 01/31/2021 in [facility] Video Visit from 11/29/2020 in [facility] Video Visit from 09/26/2020 in [facility]  Total GAD-7 Score 10 13 13       PHQ2-9    Flowsheet Row Video Visit from 07/09/2021 in [facility] Office Visit from 04/24/2021 in [facility] Video Visit from 03/12/2021 in [facility] Counselor from 03/06/2021 in [facility] Video Visit from 01/31/2021 in [facility]  PHQ-2 Total Score 2 2 0 1 0  PHQ-9 Total Score 6 10 7  -- --      Flowsheet Row Office Visit from 04/24/2021 in [facility] Counselor from 04/11/2021 in [facility] Video Visit from 03/12/2021 in [facility]  C-SSRS RISK CATEGORY No Risk No Risk No Risk        Assessment and Plan: MODEST DRAEGER is a 35 year old Caucasian female on disability, single, lives [place], has a history of MDD, GAD, PTSD was evaluated by telemedicine today.  Patient is currently improving.  Plan as noted  below.  Plan GAD-stable BuSpar 30 mg p.o. twice daily Hydroxyzine 25 mg p.o. twice daily as needed for  severe anxiety attacks only Continue CBT with Ms. Christina Hussami  PTSD-improving Zoloft 200 mg p.o. daily Continue CBT  MDD in remission Zoloft and BuSpar as prescribed  Anorexia nervosa-stable Continue CBT  Primary insomnia-improving Ambien 5 mg p.o. nightly as needed Patient advised to wear a mouth guard for her bruxism.  I have coordinated care with Ms. Christina Hussami.  Follow-up in clinic in 2 months or sooner if needed.  This note was generated in part or whole with voice recognition software. Voice recognition is usually quite accurate but there are transcription errors that can and very often do occur. I apologize for any typographical errors that were not detected and corrected.     Jomarie Longs, MD 07/10/2021, 1:33 PM

## 2021-07-24 ENCOUNTER — Other Ambulatory Visit: Payer: Self-pay

## 2021-07-24 ENCOUNTER — Ambulatory Visit (INDEPENDENT_AMBULATORY_CARE_PROVIDER_SITE_OTHER): Payer: Medicare Other | Admitting: Licensed Clinical Social Worker

## 2021-07-24 DIAGNOSIS — F4310 Post-traumatic stress disorder, unspecified: Secondary | ICD-10-CM

## 2021-07-24 DIAGNOSIS — F422 Mixed obsessional thoughts and acts: Secondary | ICD-10-CM | POA: Diagnosis not present

## 2021-07-24 NOTE — Progress Notes (Signed)
Virtual Visit via Video Note  I connected with Latasha [name] on 07/24/21 at 11:00 AM EST by a video enabled telemedicine application and verified that I am speaking with the correct person using two identifiers.  Location: Patient: home Provider: remote office [place], [place])   I discussed the limitations of evaluation and management by telemedicine and the availability of in person appointments. The patient expressed understanding and agreed to proceed.   I discussed the assessment and treatment plan with the patient. The patient was provided an opportunity to ask questions and all were answered. The patient agreed with the plan and demonstrated an understanding of the instructions.   The patient was advised to call back or seek an in-person evaluation if the symptoms worsen or if the condition fails to improve as anticipated.  I provided 40 minutes of non-face-to-face time during this encounter.   Dmarcus Decicco R Ahnaf Caponi, LCSW   THERAPIST PROGRESS NOTE  Session Time: 11-1140a  Participation Level: Active  Behavioral Response: Neat and Well GroomedAlertAnxious  Type of Therapy: Individual Therapy  Treatment Goals addressed:  Goal: LTG: Reduce frequency, intensity, and duration of PTSD symptoms so daily functioning is improved: Input needed on appropriate metric.  pt self report Outcome: Progressing Note: Pt feels that she is improving and is experiencing fewer anxiety/panic moments Goal: STG: Latasha [name] WILL EXPERIENCE A 80% REDUCTION IN EXAGGERATED BELIEFS ABOUT SELF AND OTHERS THAT INTERFERE WITH TRAUMA RESOLUTION AS EVIDENCED BY SELF-REPORT Goal: LTG: Reduce frequency, intensity, and duration of depression symptoms as evidenced by: SSB input needed on appropriate metric Outcome: Progressing Note: Pt reports improved mood--fluctuation at times Goal: STG: [name] WILL PARTICIPATE IN AT LEAST 80% OF SCHEDULED INDIVIDUAL PSYCHOTHERAPY SESSIONS Note: Pt on time and engaged  throughout session Interventions:  Intervention: REVIEW PLEASE SKILLS (TREAT PHYSICAL ILLNESS, BALANCE EATING, AVOID MOOD-ALTERING SUBSTANCES, BALANCE SLEEP AND GET EXERCISE) WITH [name] Intervention: Identify necessary consults and/or referrals Intervention: Educate patient on: Stress management Intervention: Assess emotional status and coping mechanisms Intervention: Assist with coping skills and behavior Intervention: Assess history of psychiatric trauma   Summary: Latasha [name] is a 35 y.o. female who presents with continuing symptoms related to PTSD diagnosis.   Pt reports that she feels the illnesses are impacting her overall sleep quality--which in turn impacts overall mood and stress. Reviewed current coping skills and encouraged self care time. Pt does feel like she is progressing.   Allowed pt to explore and express thoughts and feelings associated with recent life situations and external stressors. Pt reports that she has a child currently with flu and that she recently got email from one childs teacher that stomach viruses are going around the class. Pt and her parents are fearful about getting stomach viruses but feel the children need to be in school. Explored pts everyday schedule--pt states she frequently goes back to bed once the kids are in school and sleeps until 12 or 1pm. Pt then gets up and gets ready to pick up the kids. Discussed importance of self care time and how this time could be used for that. Pt feels very low energy and feels guilt that she cannot accomplish some of the tasks she wants to accomplish. Pt states that her kids fight a lot when they are home and she feels exhausted from it. Helped pt identify areas that bring her joy with the children. Discussed pts relationship with the children's father (good). Pt states that she feels that they coparent well. Helped pt identify this as a personal  strength. Discussed light therapy and yoga as interventions to help with  energy and releasing physical tension.  Continued recommendations are as follows: self care behaviors, positive social engagements, focusing on overall work/home/life balance, and focusing on positive physical and emotional wellness. .   Suicidal/Homicidal: No  Therapist Response: Pt is continuing to apply interventions learned in session into daily life situations. Pt is currently on track to meet goals utilizing interventions mentioned above. Personal growth and progress noted. Treatment to continue as indicated.    Plan: Return again in 4 weeks.  Diagnosis: Axis I: PTSD, mixed obsessional thoughts and acts, GAD    Axis II: No diagnosis    [name], LCSW 07/24/2021

## 2021-08-02 ENCOUNTER — Telehealth: Payer: Self-pay | Admitting: Oncology

## 2021-08-02 NOTE — Telephone Encounter (Signed)
Pt called to reschedule her appt for 12-16. Call back at [phone]

## 2021-08-03 ENCOUNTER — Ambulatory Visit: Payer: Medicare Other

## 2021-08-03 ENCOUNTER — Other Ambulatory Visit: Payer: Medicare Other

## 2021-08-03 ENCOUNTER — Inpatient Hospital Stay: Payer: Medicare Other | Admitting: Oncology

## 2021-08-03 ENCOUNTER — Inpatient Hospital Stay: Payer: Medicare Other

## 2021-08-03 ENCOUNTER — Ambulatory Visit: Payer: Medicare Other | Admitting: Nurse Practitioner

## 2021-08-23 ENCOUNTER — Other Ambulatory Visit: Payer: Self-pay | Admitting: *Deleted

## 2021-08-23 DIAGNOSIS — D509 Iron deficiency anemia, unspecified: Secondary | ICD-10-CM

## 2021-08-31 ENCOUNTER — Other Ambulatory Visit: Payer: Self-pay

## 2021-08-31 ENCOUNTER — Inpatient Hospital Stay: Payer: Medicare Other

## 2021-08-31 ENCOUNTER — Encounter: Payer: Self-pay | Admitting: Nurse Practitioner

## 2021-08-31 ENCOUNTER — Inpatient Hospital Stay: Payer: Medicare Other | Attending: Nurse Practitioner

## 2021-08-31 ENCOUNTER — Inpatient Hospital Stay (HOSPITAL_BASED_OUTPATIENT_CLINIC_OR_DEPARTMENT_OTHER): Payer: Medicare Other | Admitting: Nurse Practitioner

## 2021-08-31 VITALS — BP 107/73 | HR 49

## 2021-08-31 VITALS — BP 104/73 | HR 57 | Temp 98.4°F | Resp 18 | Wt 140.9 lb

## 2021-08-31 DIAGNOSIS — D509 Iron deficiency anemia, unspecified: Secondary | ICD-10-CM | POA: Diagnosis not present

## 2021-08-31 DIAGNOSIS — N920 Excessive and frequent menstruation with regular cycle: Secondary | ICD-10-CM | POA: Insufficient documentation

## 2021-08-31 DIAGNOSIS — D508 Other iron deficiency anemias: Secondary | ICD-10-CM

## 2021-08-31 DIAGNOSIS — D500 Iron deficiency anemia secondary to blood loss (chronic): Secondary | ICD-10-CM | POA: Diagnosis present

## 2021-08-31 LAB — CBC WITH DIFFERENTIAL/PLATELET
Abs Immature Granulocytes: 0.01 10*3/uL (ref 0.00–0.07)
Basophils Absolute: 0 10*3/uL (ref 0.0–0.1)
Basophils Relative: 1 %
Eosinophils Absolute: 0.1 10*3/uL (ref 0.0–0.5)
Eosinophils Relative: 2 %
HCT: 36.6 % (ref 36.0–46.0)
Hemoglobin: 12.2 g/dL (ref 12.0–15.0)
Immature Granulocytes: 0 %
Lymphocytes Relative: 32 %
Lymphs Abs: 2 10*3/uL (ref 0.7–4.0)
MCH: 31.6 pg (ref 26.0–34.0)
MCHC: 33.3 g/dL (ref 30.0–36.0)
MCV: 94.8 fL (ref 80.0–100.0)
Monocytes Absolute: 0.5 10*3/uL (ref 0.1–1.0)
Monocytes Relative: 7 %
Neutro Abs: 3.7 10*3/uL (ref 1.7–7.7)
Neutrophils Relative %: 58 %
Platelets: 212 10*3/uL (ref 150–400)
RBC: 3.86 MIL/uL — ABNORMAL LOW (ref 3.87–5.11)
RDW: 12.3 % (ref 11.5–15.5)
WBC: 6.4 10*3/uL (ref 4.0–10.5)
nRBC: 0 % (ref 0.0–0.2)

## 2021-08-31 LAB — FERRITIN: Ferritin: 40 ng/mL (ref 11–307)

## 2021-08-31 MED ORDER — SODIUM CHLORIDE 0.9 % IV SOLN: 100.0000 mg | Freq: Once | INTRAVENOUS | Status: DC

## 2021-08-31 MED ORDER — SODIUM CHLORIDE 0.9 % IV SOLN
INTRAVENOUS | Status: DC
  Filled 2021-08-31: qty 250

## 2021-08-31 MED ORDER — IRON SUCROSE 20 MG/ML IV SOLN
100.0000 mg | Freq: Once | INTRAVENOUS | Status: AC
  Administered 2021-08-31: 100 mg via INTRAVENOUS
  Filled 2021-08-31: qty 5

## 2021-08-31 NOTE — Progress Notes (Signed)
[place] at [facility]  Phone: [place] [facility] Day:  08/31/2021  Referring physician: Sharyne Peach, MD  Chief Complaint: Latasha [name] is a 36 y.o. 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  HPI: Latasha [name] is a 36 y.o. female with chronic constipation and iron deficiency anemia.  Ferritin was 3 on 10/04/2014 consistent with iron deficiency.  She denies any melena or hematochezia.  She has a history of heavy menses felt secondary to endometriosis.  Menses is now light on BCP.   EGD was normal in 08/2016.  Colonoscopy was normal in 09/2015.  She has a history of microscopic hematuria.  CT hematuria work-up on 04/29/2017 was negative.  She is followed by urology.   Work-up on 03/08/2015 confirmed iron deficiency (ferritin 5).  B12 and folate were normal.     She has received Venofer 600 mg (03/15/2015 - 04/19/2015), x2 (08/09/2015 - 08/16/2015), x1 (10/31/2015), x2 (05/08/2016 - 05/15/2016), x2 (03/05/2017 - 03/12/2017), x2 (06/04/2017 - 06/11/2017), x3 (12/24/2017 - 01/15/2018), x 2 (06/24/2018 - 07/01/2018), x 2 (02/25/2019 - 03/04/2019), and x 3 (02/24/2020 - 03/16/2020). She receives Venofer if ferritin is < 50 with symptoms.   Ferritin has been followed: 5 on 03/08/2015, 28 on 03/29/2015, 64 on 04/26/2015, 9 on 07/26/2015, 40 on 08/23/2015, 35 on 09/20/2015 29 on 10/25/2015, 32 on 02/07/2016, 36 on 05/08/2016, 39 on 02/17/2017, 39 on 05/27/2017, 42 on 09/03/2017, 32 on 12/23/2017, 68 on 03/26/2018, 42 on 06/23/2018 102 on 09/24/2018, 32 on 12/22/2018, 20 on 02/24/2019, 70 on 08/25/2019, 36 on 11/24/2019, 17 on 02/23/2020, 76 on 05/04/2020, and 109 on 06/27/2020.   She has a son with factor IX deficiency.  Another son is being evaluated for von Willebrand's disease.  Work-up on 03/08/2015 revealed a normal platelet count, PT, PTT, von Willebrand panel, and multimers.   She has a history of  microscopic hematuria.  CT hematuria workup on 04/29/2017 revealed no findings to account for the patient's microscopic hematuria. She is followed by urology. Repeat urinalysis revealed no microscopic hematuria.  The patient received the Parker COVID-19 vaccine on 11/05/2019 and 11/26/2019. She received the Booster shot in 06/19/2020.  Interval History:    Past Medical History:  Diagnosis Date   Acid reflux    Anemia    Anxiety    Asthma    Chronic constipation    Hirschsprung disease    Pelvic floor dysfunction    Syncope, cardiogenic     Past Surgical History:  Procedure Laterality Date   abdomial adhesions removed  2012   APPENDECTOMY  1997   bladder repair surgery  1997   c- sections  2013,2014   enodmetiosis removed  2012   OVARIAN CYST REMOVAL  2003, 2012   TONSILLECTOMY  2007    Family History  Problem Relation Age of Onset   Cancer Mother    Diabetes Mother    Hypertension Mother    Cancer Father    Alcohol abuse Father    Depression Father    Cancer Maternal Grandfather    Factor IX deficiency Maternal Grandfather    Factor IX deficiency Son    ADD / ADHD Son    Autism Son    Kidney cancer Neg Hx    Bladder Cancer Neg Hx     Social History:  reports that she has never smoked. She has never used smokeless tobacco. She reports that she does not drink alcohol and  does not use drugs. She lives in [place]. The patient is alone today.  Allergies:  Allergies  Allergen Reactions   Meperidine Anaphylaxis    Other reaction(s): Other (See Comments) Other Reaction: Not Assessed   Lactose     Other reaction(s): Other (See Comments) Other Reaction: GI Upset   Fludrocortisone Hives   Levofloxacin Hives   Sulfamethoxazole-Trimethoprim Other (See Comments)    Other reaction(s): Dizziness    Current Medications: Current Outpatient Medications  Medication Sig Dispense Refill   Acidophilus Lactobacillus CAPS Take 1 tablet by mouth daily.     albuterol  (VENTOLIN HFA) 108 (90 Base) MCG/ACT inhaler Inhale 1 puff into the lungs every 6 (six) hours as needed for wheezing or shortness of breath.      busPIRone (BUSPAR) 30 MG tablet Take 1 tablet (30 mg total) by mouth 2 (two) times daily. 60 tablet 1   drospirenone-ethinyl estradiol (YASMIN) 3-0.03 MG tablet Take 1 tablet by mouth daily.     EPINEPHrine (EPIPEN 2-PAK) 0.3 mg/0.3 mL IJ SOAJ injection Inject 0.3 mg into the muscle as needed. Reported on 02/07/2016     fluticasone (FLONASE) 50 MCG/ACT nasal spray SHAKE LQ AND U 2 SPRAYS IEN QD     hydrOXYzine (ATARAX/VISTARIL) 25 MG tablet Take 1 tablet (25 mg total) by mouth 2 (two) times daily as needed. Severe anxiety attacks only 60 tablet 1   midodrine (PROAMATINE) 10 MG tablet Take 10 mg by mouth daily.      norethindrone-ethinyl estradiol (LOESTRIN) 1-20 MG-MCG tablet Take 1 tablet by mouth daily.     omeprazole (PRILOSEC) 40 MG capsule   0   ondansetron (ZOFRAN) 4 MG tablet Take 4 mg by mouth every 8 (eight) hours as needed for nausea.     ondansetron (ZOFRAN-ODT) 4 MG disintegrating tablet Take by mouth.     promethazine (PHENERGAN) 25 MG tablet Take 25 mg by mouth every 8 (eight) hours as needed for vomiting.     promethazine (PHENERGAN) 25 MG tablet Take by mouth.     sertraline (ZOLOFT) 100 MG tablet Take 2 tablets (200 mg total) by mouth daily. 60 tablet 1   Sodium Phosphates (RA ENEMA) 7-19 GM/118ML ENEM Place rectally as needed (constipation).     zolpidem (AMBIEN) 5 MG tablet Take 1 tablet (5 mg total) by mouth at bedtime as needed for sleep. 30 tablet 1   Plecanatide 3 MG TABS Take 3 mg by mouth daily.  (Patient not taking: Reported on 09/26/2020)     No current facility-administered medications for this visit.   Facility-Administered Medications Ordered in Other Visits  Medication Dose Route Frequency Provider Last Rate Last Admin   0.9 %  sodium chloride infusion   Intravenous Continuous Sindy Guadeloupe, MD   Stopped at 05/09/21 1055    0.9 %  sodium chloride infusion   Intravenous Continuous Jacquelin Hawking, NP   Stopped at 05/11/21 1051   0.9 %  sodium chloride infusion   Intravenous Continuous Sindy Guadeloupe, MD 10 mL/hr at 05/16/21 1015 New Bag at 05/16/21 1015    Review of Systems  Constitutional:  Positive for malaise/fatigue. Negative for chills, fever and weight loss.  HENT:  Negative for congestion, ear pain and tinnitus.   Eyes: Negative.  Negative for blurred vision and double vision.  Respiratory: Negative.  Negative for cough, sputum production and shortness of breath.   Cardiovascular:  Negative for chest pain, palpitations and leg swelling.  Gastrointestinal: Negative.  Negative for  abdominal pain, constipation, diarrhea, nausea and vomiting.  Genitourinary:  Negative for dysuria, frequency and urgency.  Musculoskeletal:  Negative for back pain and falls.  Skin: Negative.  Negative for rash.  Neurological:  Positive for headaches. Negative for weakness.  Endo/Heme/Allergies: Negative.  Does not bruise/bleed easily.  Psychiatric/Behavioral: Negative.  Negative for depression. The patient is not nervous/anxious and does not have insomnia.   Performance status (ECOG):  0-1  Vital signs Blood pressure 104/73, pulse (!) 57, temperature 98.4 F (36.9 C), resp. rate 18, weight 140 lb 14.4 oz (63.9 kg), SpO2 98 %.  Physical Exam Constitutional:      Appearance: She is well-developed.  HENT:     Head: Normocephalic and atraumatic.  Eyes:     Pupils: Pupils are equal, round, and reactive to light.  Cardiovascular:     Rate and Rhythm: Normal rate and regular rhythm.     Heart sounds: No murmur heard. Pulmonary:     Effort: Pulmonary effort is normal.     Breath sounds: Normal breath sounds. No wheezing.  Abdominal:     General: Bowel sounds are normal. There is no distension.     Palpations: Abdomen is soft. There is no mass.     Tenderness: There is no abdominal tenderness.  Musculoskeletal:         General: Normal range of motion.     Cervical back: Normal range of motion.  Skin:    General: Skin is warm and dry.  Neurological:     Mental Status: She is alert and oriented to person, place, and time.  Psychiatric:        Behavior: Behavior normal.   Appointment on 08/31/2021  Component Date Value Ref Range Status   WBC 08/31/2021 6.4  4.0 - 10.5 K/uL Final   RBC 08/31/2021 3.86 (L)  3.87 - 5.11 MIL/uL Final   Hemoglobin 08/31/2021 12.2  12.0 - 15.0 g/dL Final   HCT 08/31/2021 36.6  36.0 - 46.0 % Final   MCV 08/31/2021 94.8  80.0 - 100.0 fL Final   MCH 08/31/2021 31.6  26.0 - 34.0 pg Final   MCHC 08/31/2021 33.3  30.0 - 36.0 g/dL Final   RDW 08/31/2021 12.3  11.5 - 15.5 % Final   Platelets 08/31/2021 212  150 - 400 K/uL Final   nRBC 08/31/2021 0.0  0.0 - 0.2 % Final   Neutrophils Relative % 08/31/2021 58  % Final   Neutro Abs 08/31/2021 3.7  1.7 - 7.7 K/uL Final   Lymphocytes Relative 08/31/2021 32  % Final   Lymphs Abs 08/31/2021 2.0  0.7 - 4.0 K/uL Final   Monocytes Relative 08/31/2021 7  % Final   Monocytes Absolute 08/31/2021 0.5  0.1 - 1.0 K/uL Final   Eosinophils Relative 08/31/2021 2  % Final   Eosinophils Absolute 08/31/2021 0.1  0.0 - 0.5 K/uL Final   Basophils Relative 08/31/2021 1  % Final   Basophils Absolute 08/31/2021 0.0  0.0 - 0.1 K/uL Final   Immature Granulocytes 08/31/2021 0  % Final   Abs Immature Granulocytes 08/31/2021 0.01  0.00 - 0.07 K/uL Final   Performed at [facility], [address] [place], [place] [address]    Assessment & Plan:  Iron deficiency- Secondary to heavy menstrual cycles. Hmg 12.2. Normocytic. Symptomatic. Ferritin 40. Iron studies not checked. Goal ferritin > 50. Plan for venofer x 3.  Menorrhagia- heavy periods lasting 10 days. d/c OCPs d/t blood clot risk. Encouraged her to see  gynecology to discuss other options for management.   Disposition: Venofer x 3 (first one today) Rtc in 3 months for labs (cbc, ferritin,  iron studies). Day to week later, See MD to establish care, +/- venofer - la  I discussed the assessment and treatment plan with the patient.  The patient was provided an opportunity to ask questions and all were answered.  The patient agreed with the plan and demonstrated an understanding of the instructions.  The patient was advised to call back if the symptoms worsen or if the condition fails to improve as anticipated.  Beckey Rutter, DNP, AGNP-C [place] at [facility] [phone] (clinic) 08/31/2021

## 2021-09-07 ENCOUNTER — Inpatient Hospital Stay: Payer: Medicare Other

## 2021-09-07 ENCOUNTER — Other Ambulatory Visit: Payer: Self-pay

## 2021-09-07 ENCOUNTER — Ambulatory Visit (INDEPENDENT_AMBULATORY_CARE_PROVIDER_SITE_OTHER): Payer: Medicare Other | Admitting: Licensed Clinical Social Worker

## 2021-09-07 VITALS — BP 108/71 | HR 55 | Temp 97.2°F | Resp 16

## 2021-09-07 DIAGNOSIS — F4310 Post-traumatic stress disorder, unspecified: Secondary | ICD-10-CM | POA: Diagnosis not present

## 2021-09-07 DIAGNOSIS — F422 Mixed obsessional thoughts and acts: Secondary | ICD-10-CM | POA: Diagnosis not present

## 2021-09-07 DIAGNOSIS — D500 Iron deficiency anemia secondary to blood loss (chronic): Secondary | ICD-10-CM | POA: Diagnosis not present

## 2021-09-07 DIAGNOSIS — D508 Other iron deficiency anemias: Secondary | ICD-10-CM

## 2021-09-07 MED ORDER — SODIUM CHLORIDE 0.9 % IV SOLN: 100.0000 mg | Freq: Once | INTRAVENOUS | Status: DC

## 2021-09-07 MED ORDER — SODIUM CHLORIDE 0.9 % IV SOLN
Freq: Once | INTRAVENOUS | Status: AC
  Filled 2021-09-07: qty 250

## 2021-09-07 MED ORDER — IRON SUCROSE 20 MG/ML IV SOLN
100.0000 mg | Freq: Once | INTRAVENOUS | Status: AC
  Administered 2021-09-07: 100 mg via INTRAVENOUS
  Filled 2021-09-07: qty 5
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
## 2021-09-07 NOTE — Plan of Care (Signed)
°  Problem: Decrease depressive symptoms and improve levels of effective functioning Goal: LTG: Reduce frequency, intensity, and duration of depression symptoms as evidenced by: SSB input needed on appropriate metric Outcome: Progressing Goal: STG: [place] WILL PARTICIPATE IN AT LEAST 80% OF SCHEDULED INDIVIDUAL PSYCHOTHERAPY SESSIONS Outcome: Progressing   Problem: Reduce the negative impact trauma related symptoms have on social, occupational, and family functioning. Goal: LTG: Reduce frequency, intensity, and duration of PTSD symptoms so daily functioning is improved: Input needed on appropriate metric.  pt self report Outcome: Progressing Goal: STG: Danika WILL EXPERIENCE A 80% REDUCTION IN EXAGGERATED BELIEFS ABOUT SELF AND OTHERS THAT INTERFERE WITH TRAUMA RESOLUTION AS EVIDENCED BY SELF-REPORT Outcome: Progressing

## 2021-09-07 NOTE — Patient Instructions (Signed)

## 2021-09-10 NOTE — Plan of Care (Signed)
°  Problem: Decrease depressive symptoms and improve levels of effective functioning ?Goal: LTG: Reduce frequency, intensity, and duration of depression symptoms as evidenced by: SSB input needed on appropriate metric ?Outcome: Progressing ?Goal: STG: Latasha [name] WILL PARTICIPATE IN AT LEAST 80% OF SCHEDULED INDIVIDUAL PSYCHOTHERAPY SESSIONS ?Outcome: Progressing ?  ?Problem: Reduce the negative impact trauma related symptoms have on social, occupational, and family functioning. ?Goal: LTG: Reduce frequency, intensity, and duration of PTSD symptoms so daily functioning is improved: Input needed on appropriate metric.  pt self report ?Outcome: Progressing ?Goal: STG: Latasha [name] WILL EXPERIENCE A 80% REDUCTION IN EXAGGERATED BELIEFS ABOUT SELF AND OTHERS THAT INTERFERE WITH TRAUMA RESOLUTION AS EVIDENCED BY SELF-REPORT ?Outcome: Progressing ?  ?

## 2021-09-12 ENCOUNTER — Other Ambulatory Visit: Payer: Self-pay

## 2021-09-12 ENCOUNTER — Telehealth (INDEPENDENT_AMBULATORY_CARE_PROVIDER_SITE_OTHER): Payer: Medicare Other | Admitting: Psychiatry

## 2021-09-12 ENCOUNTER — Encounter: Payer: Self-pay | Admitting: Psychiatry

## 2021-09-12 DIAGNOSIS — F4310 Post-traumatic stress disorder, unspecified: Secondary | ICD-10-CM | POA: Diagnosis not present

## 2021-09-12 DIAGNOSIS — F3342 Major depressive disorder, recurrent, in full remission: Secondary | ICD-10-CM

## 2021-09-12 DIAGNOSIS — F411 Generalized anxiety disorder: Secondary | ICD-10-CM

## 2021-09-12 DIAGNOSIS — F5000 Anorexia nervosa, unspecified: Secondary | ICD-10-CM | POA: Diagnosis not present

## 2021-09-12 DIAGNOSIS — F5101 Primary insomnia: Secondary | ICD-10-CM

## 2021-09-12 MED ORDER — ZOLPIDEM TARTRATE 5 MG PO TABS: 5.0000 mg | ORAL_TABLET | Freq: Every evening | ORAL | 1 refills | Status: DC | PRN

## 2021-09-12 MED ORDER — SERTRALINE HCL 100 MG PO TABS: 200.0000 mg | ORAL_TABLET | Freq: Every day | ORAL | 0 refills | Status: DC

## 2021-09-12 MED ORDER — MIRTAZAPINE 7.5 MG PO TABS: 7.5000 mg | ORAL_TABLET | Freq: Every day | ORAL | 1 refills | Status: DC

## 2021-09-12 MED ORDER — BUSPIRONE HCL 15 MG PO TABS: 15.0000 mg | ORAL_TABLET | Freq: Two times a day (BID) | ORAL | 1 refills | Status: DC

## 2021-09-12 NOTE — Progress Notes (Signed)
Virtual Visit via Video Note  I connected with Latasha [name] on 09/12/21 at  1:00 PM EST by a video enabled telemedicine application and verified that I am speaking with the correct person using two identifiers.  Location Provider Location : [facility] Patient Location : Home  Participants: Patient , Provider    I discussed the limitations of evaluation and management by telemedicine and the availability of in person appointments. The patient expressed understanding and agreed to proceed.   I discussed the assessment and treatment plan with the patient. The patient was provided an opportunity to ask questions and all were answered. The patient agreed with the plan and demonstrated an understanding of the instructions.   The patient was advised to call back or seek an in-person evaluation if the symptoms worsen or if the condition fails to improve as anticipated.    [facility] MD OP Progress Note  09/12/2021 3:56 PM Latasha [name]  MRN:  [id number]  Chief Complaint:  Chief Complaint   Follow-up 36 year old Caucasian female, single, lives with the children, has a history of GAD, MDD, PTSD, anorexia nervosa, primary insomnia, presents with worsening anxiety symptoms for medication management.    HPI: Latasha [name] is a 36 year old Caucasian female, single on SSD, lives in [place], lives with her children, her parents, has a history of MDD, GAD, history of anorexia nervosa, Hirschsprung's disease, iron-deficiency anemia chronic constipation, rectovesical syncope, history of chronic fatigue syndrome was evaluated by telemedicine today.  Patient today reports since the past few weeks she has been more anxious.  She usually does not do well around this time of the year.  In addition to that she reports there is a "Norovirus scare' going on in schools and that makes her anxious.  She reports she was recently riding with someone and one of the kids friends got sick and this made her extremely  anxious.  Patient reports she continues to have panic attacks when she feels physical symptoms of nausea, heart palpitation and so on which can last for 15 minutes and then she feels tired at anxious the rest of the day.  This has been getting worse since the past few weeks.  She does have Zofran available for nausea which does not help much.  She does not use hydroxyzine during the day since she is worried it might make her drowsy.  Patient reports sleep as restless.  She sleeps around 6 hours, does have some nightmares.  Patient reports the Ambien does help her to sleep when she takes it however she does not use it much.  Denies suicidality, homicidality or perceptual disturbances.  Patient denies any other concerns today.  Visit Diagnosis:    ICD-10-CM   1. GAD (generalized anxiety disorder)  F41.1 busPIRone (BUSPAR) 15 MG tablet    mirtazapine (REMERON) 7.5 MG tablet    sertraline (ZOLOFT) 100 MG tablet    2. MDD (major depressive disorder), recurrent, in full remission ([place])  F33.42 busPIRone (BUSPAR) 15 MG tablet    mirtazapine (REMERON) 7.5 MG tablet    sertraline (ZOLOFT) 100 MG tablet    3. PTSD (post-traumatic stress disorder)  F43.10 mirtazapine (REMERON) 7.5 MG tablet    sertraline (ZOLOFT) 100 MG tablet    zolpidem (AMBIEN) 5 MG tablet    4. Anorexia nervosa  F50.00     5. Primary insomnia  F51.01 mirtazapine (REMERON) 7.5 MG tablet      Past Psychiatric History: Reviewed past psychiatric history from progress note on  09/26/2020.  Past trials of Zoloft, Wellbutrin, BuSpar, Prozac, Paxil, Xanax.  Past Medical History:  Past Medical History:  Diagnosis Date   Acid reflux    Anemia    Anxiety    Asthma    Chronic constipation    Hirschsprung disease    Pelvic floor dysfunction    Syncope, cardiogenic     Past Surgical History:  Procedure Laterality Date   abdomial adhesions removed  2012   APPENDECTOMY  1997   bladder repair surgery  1997   c- sections   2013,2014   enodmetiosis removed  2012   OVARIAN CYST REMOVAL  2003, 2012   TONSILLECTOMY  2007    Family Psychiatric History: Reviewed family psychiatric history from progress note on 09/26/2020.  Family History:  Family History  Problem Relation Age of Onset   Cancer Mother    Diabetes Mother    Hypertension Mother    Cancer Father    Alcohol abuse Father    Depression Father    Cancer Maternal Grandfather    Factor IX deficiency Maternal Grandfather    Factor IX deficiency Son    ADD / ADHD Son    Autism Son    Kidney cancer Neg Hx    Bladder Cancer Neg Hx     Social History: Reviewed social history from progress note on 09/26/2020. Social History   Socioeconomic History   Marital status: Single    Spouse name: Not on file   Number of children: 2   Years of education: 12 th grade, some college   Highest education level: Not on file  Occupational History   Occupation: disabled  Tobacco Use   Smoking status: Never   Smokeless tobacco: Never  Substance and Sexual Activity   Alcohol use: No   Drug use: No   Sexual activity: Yes    Partners: Male    Birth control/protection: Condom, Pill  Other Topics Concern   Not on file  Social History Narrative   Not on file   Social Determinants of Health   Financial Resource Strain: Not on file  Food Insecurity: Not on file  Transportation Needs: Not on file  Physical Activity: Not on file  Stress: Not on file  Social Connections: Not on file    Allergies:  Allergies  Allergen Reactions   Meperidine Anaphylaxis    Other reaction(s): Other (See Comments) Other Reaction: Not Assessed   Lactose     Other reaction(s): Other (See Comments) Other Reaction: GI Upset   Fludrocortisone Hives   Levofloxacin Hives   Sulfamethoxazole-Trimethoprim Other (See Comments)    Other reaction(s): Dizziness    Metabolic Disorder Labs: No results found for: HGBA1C, MPG No results found for: PROLACTIN No results found for:  CHOL, TRIG, HDL, CHOLHDL, VLDL, LDLCALC Lab Results  Component Value Date   TSH 1.276 10/30/2020    Therapeutic Level Labs: No results found for: LITHIUM No results found for: VALPROATE No components found for:  CBMZ  Current Medications: Current Outpatient Medications  Medication Sig Dispense Refill   busPIRone (BUSPAR) 15 MG tablet Take 1 tablet (15 mg total) by mouth 2 (two) times daily. 60 tablet 1   mirtazapine (REMERON) 7.5 MG tablet Take 1 tablet (7.5 mg total) by mouth at bedtime. 30 tablet 1   Acidophilus Lactobacillus CAPS Take 1 tablet by mouth daily.     albuterol (VENTOLIN HFA) 108 (90 Base) MCG/ACT inhaler Inhale 1 puff into the lungs every 6 (six) hours as needed for  wheezing or shortness of breath.      drospirenone-ethinyl estradiol (YASMIN) 3-0.03 MG tablet Take 1 tablet by mouth daily.     EPINEPHrine (EPIPEN 2-PAK) 0.3 mg/0.3 mL IJ SOAJ injection Inject 0.3 mg into the muscle as needed. Reported on 02/07/2016     fluticasone (FLONASE) 50 MCG/ACT nasal spray SHAKE LQ AND U 2 SPRAYS IEN QD     hydrOXYzine (ATARAX/VISTARIL) 25 MG tablet Take 1 tablet (25 mg total) by mouth 2 (two) times daily as needed. Severe anxiety attacks only 60 tablet 1   midodrine (PROAMATINE) 10 MG tablet Take 10 mg by mouth daily.      norethindrone-ethinyl estradiol (LOESTRIN) 1-20 MG-MCG tablet Take 1 tablet by mouth daily.     omeprazole (PRILOSEC) 40 MG capsule   0   ondansetron (ZOFRAN) 4 MG tablet Take 4 mg by mouth every 8 (eight) hours as needed for nausea.     ondansetron (ZOFRAN-ODT) 4 MG disintegrating tablet Take by mouth.     Plecanatide 3 MG TABS Take 3 mg by mouth daily.  (Patient not taking: Reported on 09/26/2020)     promethazine (PHENERGAN) 25 MG tablet Take 25 mg by mouth every 8 (eight) hours as needed for vomiting.     promethazine (PHENERGAN) 25 MG tablet Take by mouth.     sertraline (ZOLOFT) 100 MG tablet Take 2 tablets (200 mg total) by mouth daily. 60 tablet 0   Sodium  Phosphates (RA ENEMA) 7-19 GM/118ML ENEM Place rectally as needed (constipation).     zolpidem (AMBIEN) 5 MG tablet Take 1 tablet (5 mg total) by mouth at bedtime as needed for sleep. 15 tablet 1   No current facility-administered medications for this visit.   Facility-Administered Medications Ordered in Other Visits  Medication Dose Route Frequency Provider Last Rate Last Admin   0.9 %  sodium chloride infusion   Intravenous Continuous Sindy Guadeloupe, MD   Stopped at 05/09/21 1055   0.9 %  sodium chloride infusion   Intravenous Continuous Jacquelin Hawking, NP   Stopped at 05/11/21 1051   0.9 %  sodium chloride infusion   Intravenous Continuous Sindy Guadeloupe, MD 10 mL/hr at 05/16/21 1015 New Bag at 05/16/21 1015     Musculoskeletal: Strength & Muscle Tone:  UTA Gait & Station:  Seated Patient leans: N/A  Psychiatric Specialty Exam: Review of Systems  Gastrointestinal:  Positive for nausea (chronic).  Psychiatric/Behavioral:  Positive for sleep disturbance. The patient is nervous/anxious.   All other systems reviewed and are negative.  There were no vitals taken for this visit.There is no height or weight on file to calculate BMI.  General Appearance: Casual  Eye Contact:  Fair  Speech:  Clear and Coherent  Volume:  Normal  Mood:  Anxious  Affect:  Congruent  Thought Process:  Goal Directed and Descriptions of Associations: Intact  Orientation:  Full (Time, Place, and Person)  Thought Content: Logical   Suicidal Thoughts:  No  Homicidal Thoughts:  No  Memory:  Immediate;   Fair Recent;   Fair Remote;   Fair  Judgement:  Fair  Insight:  Fair  Psychomotor Activity:  Normal  Concentration:  Concentration: Fair and Attention Span: Fair  Recall:  [employer] of Knowledge: Fair  Language: Fair  Akathisia:  No  Handed:  Right  AIMS (if indicated): not done  Assets:  Communication Skills Desire for Improvement Housing Social Support  ADL's:  Intact  Cognition: WNL   Sleep:  Poor   Screenings: GAD-7    Flowsheet Row Video Visit from 09/12/2021 in [facility] Video Visit from 01/31/2021 in [facility] Video Visit from 11/29/2020 in [facility] Video Visit from 09/26/2020 in [facility]  Total GAD-7 Score 17 10 13 13       PHQ2-9    Flowsheet Row Video Visit from 09/12/2021 in [facility] Video Visit from 07/09/2021 in [facility] Office Visit from 04/24/2021 in [facility] Video Visit from 03/12/2021 in [facility] from 03/06/2021 in [facility]  PHQ-2 Total Score 0 2 2 0 1  PHQ-9 Total Score -- 6 10 7  --      Flowsheet Row Counselor from 07/24/2021 in [facility] Office Visit from 04/24/2021 in [facility] from 04/11/2021 in [facility] No Risk No Risk No Risk        Assessment and Plan: Latasha [name] is a 36 year old Caucasian female on disability, single, lives at [facility] has a history of MDD, GAD, PTSD was evaluated by telemedicine today.  Patient with worsening anxiety symptoms, panic attacks, sleep problems will benefit from the following plan.  Plan GAD-unstable Reduce BuSpar to 15 mg p.o. twice daily Start Remeron 7.5 mg p.o. nightly Continue hydroxyzine 25 mg p.o. twice daily as needed for severe anxiety attacks only.  PTSD-improving Zoloft 200 mg p.o. daily Continue CBT with Ms. Christina Hussami  MDD in remission Zoloft and BuSpar as prescribed  Anorexia nervosa-stable Continue CBT  Primary insomnia-unstable Remeron 7.5 mg p.o. nightly will help. She does have Ambien 5 mg nightly as needed available Continue to use mouthguard for  bruxism.  Follow-up in clinic in 2 weeks or sooner if needed.  This note was generated in part or whole with voice recognition software. Voice recognition is usually quite accurate but there are transcription errors that can and very often do occur. I apologize for any typographical errors that were not detected and corrected.       Ursula Alert, MD 09/12/2021, 3:56 PM

## 2021-09-12 NOTE — Patient Instructions (Signed)
Mirtazapine Tablets °What is this medication? °MIRTAZAPINE (mir TAZ a peen) treats depression. It increases the amount of serotonin and norepinephrine in the brain, hormones that help regulate mood. °This medicine may be used for other purposes; ask your health care provider or pharmacist if you have questions. °COMMON BRAND NAME(S): Remeron °What should I tell my care team before I take this medication? °They need to know if you have any of these conditions: °Bipolar disorder °Glaucoma °Kidney disease °Liver disease °Suicidal thoughts °An unusual or allergic reaction to mirtazapine, other medications, foods, dyes, or preservatives °Pregnant or trying to get pregnant °Breast-feeding °How should I use this medication? °Take this medication by mouth with a glass of water. Follow the directions on the prescription label. Take your medication at regular intervals. Do not take your medication more often than directed. Do not stop taking this medication suddenly except upon the advice of your care team. Stopping this medication too quickly may cause serious side effects or your condition may worsen. °A special MedGuide will be given to you by the pharmacist with each prescription and refill. Be sure to read this information carefully each time. °Talk to your care team about the use of this medication in children. Special care may be needed. °Overdosage: If you think you have taken too much of this medicine contact a poison control center or emergency room at once. °NOTE: This medicine is only for you. Do not share this medicine with others. °What if I miss a dose? °If you miss a dose, take it as soon as you can. If it is almost time for your next dose, take only that dose. Do not take double or extra doses. °What may interact with this medication? °Do not take this medication with any of the following: °Linezolid °MAOIs like Carbex, Eldepryl, Marplan, Nardil, and Parnate °Methylene blue (injected into a vein) °This  medication may also interact with the following: °Alcohol °Antiviral medications for HIV or AIDS °Certain medications that treat or prevent blood clots like warfarin °Certain medications for depression, anxiety, or psychotic disturbances °Certain medications for fungal infections like ketoconazole and itraconazole °Certain medications for migraine headache like almotriptan, eletriptan, frovatriptan, naratriptan, rizatriptan, sumatriptan, zolmitriptan °Certain medications for seizures like carbamazepine or phenytoin °Certain medications for sleep °Cimetidine °Erythromycin °Fentanyl °Lithium °Medications for blood pressure °Nefazodone °Rasagiline °Rifampin °Supplements like St. John's wort, kava kava, valerian °Tramadol °Tryptophan °This list may not describe all possible interactions. Give your health care provider a list of all the medicines, herbs, non-prescription drugs, or dietary supplements you use. Also tell them if you smoke, drink alcohol, or use illegal drugs. Some items may interact with your medicine. °What should I watch for while using this medication? °Tell your care team if your symptoms do not get better or if they get worse. Visit your care team for regular checks on your progress. Because it may take several weeks to see the full effects of this medication, it is important to continue your treatment as prescribed by your doctor. °Patients and their families should watch out for new or worsening thoughts of suicide or depression. Also watch out for sudden changes in feelings such as feeling anxious, agitated, panicky, irritable, hostile, aggressive, impulsive, severely restless, overly excited and hyperactive, or not being able to sleep. If this happens, especially at the beginning of treatment or after a change in dose, call your care team. °You may get drowsy or dizzy. Do not drive, use machinery, or do anything that needs mental alertness until   you know how this medication affects you. Do not  stand or sit up quickly, especially if you are an older patient. This reduces the risk of dizzy or fainting spells. Alcohol may interfere with the effect of this medication. Avoid alcoholic drinks. °This medication may cause dry eyes and blurred vision. If you wear contact lenses you may feel some discomfort. Lubricating drops may help. See your eye care team if the problem does not go away or is severe. °Your mouth may get dry. Chewing sugarless gum or sucking hard candy, and drinking plenty of water may help. Contact your care team if the problem does not go away or is severe. °What side effects may I notice from receiving this medication? °Side effects that you should report to your care team as soon as possible: °Allergic reactions--skin rash, itching, hives, swelling of the face, lips, tongue, or throat °Heart rhythm changes--fast or irregular heartbeat, dizziness, feeling faint or lightheaded, chest pain, trouble breathing °Infection--fever, chills, cough, or sore throat °Irritability, confusion, fast or irregular heartbeat, muscle stiffness, twitching muscles, sweating, high fever, seizure, chills, vomiting, diarrhea, which may be signs of serotonin syndrome °Low sodium level--muscle weakness, fatigue, dizziness, headache, confusion °Rash, fever, and swollen lymph nodes °Redness, blistering, peeling or loosening of the skin, including inside the mouth °Seizures °Sudden eye pain or change in vision such as blurry vision, seeing halos around lights, vision loss °Thoughts of suicide or self-harm, worsening mood, feelings of depression °Side effects that usually do not require medical attention (report to your care team if they continue or are bothersome): °Constipation °Dizziness °Drowsiness °Dry mouth °Increase in appetite °Weight gain °This list may not describe all possible side effects. Call your doctor for medical advice about side effects. You may report side effects to FDA at 1-800-FDA-1088. °Where should  I keep my medication? °Keep out of the reach of children. °Store at room temperature between 15 and 30 degrees C (59 and 86 degrees F) Protect from light and moisture. Throw away any unused medication after the expiration date. °NOTE: This sheet is a summary. It may not cover all possible information. If you have questions about this medicine, talk to your doctor, pharmacist, or health care provider. °© 2022 [employer]vier/Gold Standard (2020-11-01 00:00:00) ° °

## 2021-09-14 ENCOUNTER — Inpatient Hospital Stay: Payer: Medicare Other

## 2021-09-14 ENCOUNTER — Other Ambulatory Visit: Payer: Self-pay

## 2021-09-14 VITALS — BP 107/67 | HR 66 | Temp 98.2°F | Resp 18

## 2021-09-14 DIAGNOSIS — D500 Iron deficiency anemia secondary to blood loss (chronic): Secondary | ICD-10-CM | POA: Diagnosis not present

## 2021-09-14 DIAGNOSIS — D508 Other iron deficiency anemias: Secondary | ICD-10-CM

## 2021-09-14 MED ORDER — IRON SUCROSE 20 MG/ML IV SOLN
100.0000 mg | Freq: Once | INTRAVENOUS | Status: AC
  Administered 2021-09-14: 100 mg via INTRAVENOUS
  Filled 2021-09-14: qty 5

## 2021-09-14 MED ORDER — SODIUM CHLORIDE 0.9 % IV SOLN: 100.0000 mg | Freq: Once | INTRAVENOUS | Status: DC

## 2021-09-14 MED ORDER — SODIUM CHLORIDE 0.9 % IV SOLN
Freq: Once | INTRAVENOUS | Status: AC
  Filled 2021-09-14: qty 250

## 2021-09-14 NOTE — Patient Instructions (Signed)
[facility]   ?Discharge Instructions: ?Thank you for choosing [facility] to provide your oncology and hematology care.  ?If you have a lab appointment with the Cancer Center, please go directly to the Cancer Center and check in at the registration area. ?  ?We strive to give you quality time with your provider. You may need to reschedule your appointment if you arrive late (15 or more minutes).  Arriving late affects you and other patients whose appointments are after yours.  Also, if you miss three or more appointments without notifying the office, you may be dismissed from the clinic at the provider?s discretion.    ?  ?For prescription refill requests, have your pharmacy contact our office and allow 72 hours for refills to be completed.   ? ?Today you received the following: Venofer.    ?  ?BELOW ARE SYMPTOMS THAT SHOULD BE REPORTED IMMEDIATELY: ?*FEVER GREATER THAN 100.4 F (38 ?C) OR HIGHER ?*CHILLS OR SWEATING ?*NAUSEA AND VOMITING THAT IS NOT CONTROLLED WITH YOUR NAUSEA MEDICATION ?*UNUSUAL SHORTNESS OF BREATH ?*UNUSUAL BRUISING OR BLEEDING ?*URINARY PROBLEMS (pain or burning when urinating, or frequent urination) ?*BOWEL PROBLEMS (unusual diarrhea, constipation, pain near the anus) ?TENDERNESS IN MOUTH AND THROAT WITH OR WITHOUT PRESENCE OF ULCERS (sore throat, sores in mouth, or a toothache) ?UNUSUAL RASH, SWELLING OR PAIN  ?UNUSUAL VAGINAL DISCHARGE OR ITCHING  ? ?Items with * indicate a potential emergency and should be followed up as soon as possible or go to the Emergency Department if any problems should occur. ? ?Should you have questions after your visit or need to cancel or reschedule your appointment, please contact [facility]  Dept: [phone]  and follow the prompts.  Office hours are 8:00 a.m. to 4:30 p.m. Monday - Friday. Please note that voicemails left after 4:00 p.m. may not be returned until the following  business day.  We are closed weekends and major holidays. You have access to a nurse at all times for urgent questions. Please call the main number to the clinic Dept: [phone] and follow the prompts. ? ?For any non-urgent questions, you may also contact your provider using MyChart. We now offer e-Visits for anyone 18 and older to request care online for non-urgent symptoms. For details visit [email or web address]. ?  ?Also download the MyChart app! Go to the app store, search "MyChart", open the app, select [facility], and log in with your MyChart username and password. ? ?Due to Covid, a mask is required upon entering the hospital/clinic. If you do not have a mask, one will be given to you upon arrival. For doctor visits, patients may have 1 support person aged 18 or older with them. For treatment visits, patients cannot have anyone with them due to current Covid guidelines and our immunocompromised population.  ?

## 2021-09-28 ENCOUNTER — Telehealth: Payer: Medicare Other | Admitting: Psychiatry

## 2021-10-05 ENCOUNTER — Encounter: Payer: Self-pay | Admitting: Psychiatry

## 2021-10-05 ENCOUNTER — Other Ambulatory Visit: Payer: Self-pay

## 2021-10-05 ENCOUNTER — Telehealth (INDEPENDENT_AMBULATORY_CARE_PROVIDER_SITE_OTHER): Payer: Medicare Other | Admitting: Psychiatry

## 2021-10-05 DIAGNOSIS — F411 Generalized anxiety disorder: Secondary | ICD-10-CM

## 2021-10-05 DIAGNOSIS — F3342 Major depressive disorder, recurrent, in full remission: Secondary | ICD-10-CM

## 2021-10-05 DIAGNOSIS — Z9189 Other specified personal risk factors, not elsewhere classified: Secondary | ICD-10-CM

## 2021-10-05 DIAGNOSIS — F4310 Post-traumatic stress disorder, unspecified: Secondary | ICD-10-CM

## 2021-10-05 DIAGNOSIS — F5000 Anorexia nervosa, unspecified: Secondary | ICD-10-CM | POA: Diagnosis not present

## 2021-10-05 DIAGNOSIS — F5101 Primary insomnia: Secondary | ICD-10-CM

## 2021-10-05 MED ORDER — SERTRALINE HCL 100 MG PO TABS: 200.0000 mg | ORAL_TABLET | Freq: Every day | ORAL | 2 refills | Status: DC

## 2021-10-05 MED ORDER — QUETIAPINE FUMARATE 25 MG PO TABS: 12.5000 mg | ORAL_TABLET | Freq: Every day | ORAL | 0 refills | Status: DC

## 2021-10-05 NOTE — Progress Notes (Signed)
Virtual Visit via Video Note  I connected with Latasha [name] on 10/05/21 at  8:30 AM EST by a video enabled telemedicine application and verified that I am speaking with the correct person using two identifiers.  Location Provider Location : [facility] Patient Location : Home  Participants: Patient , Provider    I discussed the limitations of evaluation and management by telemedicine and the availability of in person appointments. The patient expressed understanding and agreed to proceed.   I discussed the assessment and treatment plan with the patient. The patient was provided an opportunity to ask questions and all were answered. The patient agreed with the plan and demonstrated an understanding of the instructions.   The patient was advised to call back or seek an in-person evaluation if the symptoms worsen or if the condition fails to improve as anticipated.   BH MD OP Progress Note  10/05/2021 8:59 AM Latasha [name]  MRN:  [id number]  Chief Complaint:  Chief Complaint  Patient presents with   Follow-up 36 year-old Caucasian female, with history of GAD, MDD, PTSD, anorexia nervosa, primary insomnia was evaluated for medication management.   HPI: Latasha [name] is a 36 year old Caucasian female, single, on SSD, lives in [place], lives with her children and her parents, history of MDD, GAD, PTSD, anorexia nervosa, primary insomnia, Hirschsprung's disease, iron deficiency anemia, chronic constipation, history of chronic fatigue syndrome was evaluated by telemedicine today.  Patient reports she developed side effects to the mirtazapine.  Had sleepwalking episodes.  Patient hence stopped taking it.  Patient reports she continues to be anxious, worries about different things, easily gets irritable, is nervous and restless often.  Sertraline by itself is not managing her anxiety.  She continues to follow-up with her psychotherapist and reports therapy sessions are  beneficial.  Patient denies any sadness or crying spells.  Reports appetite is fair.  Denies any anorexic episodes.  Denies any suicidality, homicidality or perceptual disturbances.  Denies any other concerns today.  Visit Diagnosis:    ICD-10-CM   1. GAD (generalized anxiety disorder)  F41.1 QUEtiapine (SEROQUEL) 25 MG tablet    sertraline (ZOLOFT) 100 MG tablet    2. MDD (major depressive disorder), recurrent, in full remission (HCC)  F33.42 sertraline (ZOLOFT) 100 MG tablet    3. PTSD (post-traumatic stress disorder)  F43.10 QUEtiapine (SEROQUEL) 25 MG tablet    sertraline (ZOLOFT) 100 MG tablet    4. Anorexia nervosa  F50.00     5. Primary insomnia  F51.01 QUEtiapine (SEROQUEL) 25 MG tablet    6. At risk for prolonged QT interval syndrome  Z91.89       Past Psychiatric History: Reviewed past psychiatric history from progress note on 09/26/2020.  Past trials of Zoloft, Wellbutrin, BuSpar, Prozac, Paxil, Xanax.  Past Medical History:  Past Medical History:  Diagnosis Date   Acid reflux    Anemia    Anxiety    Asthma    Chronic constipation    Hirschsprung disease    Pelvic floor dysfunction    Syncope, cardiogenic     Past Surgical History:  Procedure Laterality Date   abdomial adhesions removed  2012   APPENDECTOMY  1997   bladder repair surgery  1997   c- sections  2013,2014   enodmetiosis removed  2012   OVARIAN CYST REMOVAL  2003, 2012   TONSILLECTOMY  2007    Family Psychiatric History: Reviewed family psychiatric history from progress note on 09/26/2020.  Family History:  Family History  Problem Relation Age of Onset   Cancer Mother    Diabetes Mother    Hypertension Mother    Cancer Father    Alcohol abuse Father    Depression Father    Cancer Maternal Grandfather    Factor IX deficiency Maternal Grandfather    Factor IX deficiency Son    ADD / ADHD Son    Autism Son    Kidney cancer Neg Hx    Bladder Cancer Neg Hx     Social History:  Reviewed social history from progress note on 09/26/2020. Social History   Socioeconomic History   Marital status: Single    Spouse name: Not on file   Number of children: 2   Years of education: 12 th grade, some college   Highest education level: Not on file  Occupational History   Occupation: disabled  Tobacco Use   Smoking status: Never   Smokeless tobacco: Never  Substance and Sexual Activity   Alcohol use: No   Drug use: No   Sexual activity: Yes    Partners: Male    Birth control/protection: Condom, Pill  Other Topics Concern   Not on file  Social History Narrative   Not on file   Social Determinants of Health   Financial Resource Strain: Not on file  Food Insecurity: Not on file  Transportation Needs: Not on file  Physical Activity: Not on file  Stress: Not on file  Social Connections: Not on file    Allergies:  Allergies  Allergen Reactions   Meperidine Anaphylaxis    Other reaction(s): Other (See Comments) Other Reaction: Not Assessed   Lactose     Other reaction(s): Other (See Comments) Other Reaction: GI Upset   Fludrocortisone Hives   Levofloxacin Hives   Sulfamethoxazole-Trimethoprim Other (See Comments)    Other reaction(s): Dizziness    Metabolic Disorder Labs: No results found for: HGBA1C, MPG No results found for: PROLACTIN No results found for: CHOL, TRIG, HDL, CHOLHDL, VLDL, LDLCALC Lab Results  Component Value Date   TSH 1.276 10/30/2020    Therapeutic Level Labs: No results found for: LITHIUM No results found for: VALPROATE No components found for:  CBMZ  Current Medications: Current Outpatient Medications  Medication Sig Dispense Refill   QUEtiapine (SEROQUEL) 25 MG tablet Take 0.5-1 tablets (12.5-25 mg total) by mouth at bedtime. 30 tablet 0   Acidophilus Lactobacillus CAPS Take 1 tablet by mouth daily.     albuterol (VENTOLIN HFA) 108 (90 Base) MCG/ACT inhaler Inhale 1 puff into the lungs every 6 (six) hours as needed for  wheezing or shortness of breath.      busPIRone (BUSPAR) 15 MG tablet Take 1 tablet (15 mg total) by mouth 2 (two) times daily. 60 tablet 1   drospirenone-ethinyl estradiol (YASMIN) 3-0.03 MG tablet Take 1 tablet by mouth daily.     EPINEPHrine (EPIPEN 2-PAK) 0.3 mg/0.3 mL IJ SOAJ injection Inject 0.3 mg into the muscle as needed. Reported on 02/07/2016     fluticasone (FLONASE) 50 MCG/ACT nasal spray SHAKE LQ AND U 2 SPRAYS IEN QD     hydrOXYzine (ATARAX/VISTARIL) 25 MG tablet Take 1 tablet (25 mg total) by mouth 2 (two) times daily as needed. Severe anxiety attacks only 60 tablet 1   metroNIDAZOLE (FLAGYL) 500 MG tablet Take by mouth.     midodrine (PROAMATINE) 10 MG tablet Take 10 mg by mouth daily.      norethindrone-ethinyl estradiol (LOESTRIN) 1-20 MG-MCG tablet Take 1 tablet  by mouth daily.     omeprazole (PRILOSEC) 40 MG capsule   0   ondansetron (ZOFRAN) 4 MG tablet Take 4 mg by mouth every 8 (eight) hours as needed for nausea.     ondansetron (ZOFRAN-ODT) 4 MG disintegrating tablet Take by mouth.     Plecanatide 3 MG TABS Take 3 mg by mouth daily.  (Patient not taking: Reported on 09/26/2020)     promethazine (PHENERGAN) 25 MG tablet Take 25 mg by mouth every 8 (eight) hours as needed for vomiting.     promethazine (PHENERGAN) 25 MG tablet Take by mouth.     sertraline (ZOLOFT) 100 MG tablet Take 2 tablets (200 mg total) by mouth daily. 60 tablet 2   Sodium Phosphates (RA ENEMA) 7-19 GM/118ML ENEM Place rectally as needed (constipation).     No current facility-administered medications for this visit.   Facility-Administered Medications Ordered in Other Visits  Medication Dose Route Frequency Provider Last Rate Last Admin   0.9 %  sodium chloride infusion   Intravenous Continuous Creig Hinesao, Archana C, MD   Stopped at 05/09/21 1055   0.9 %  sodium chloride infusion   Intravenous Continuous Mauro KaufmannBurns, Jennifer E, NP   Stopped at 05/11/21 1051   0.9 %  sodium chloride infusion   Intravenous  Continuous Creig Hinesao, Archana C, MD 10 mL/hr at 05/16/21 1015 New Bag at 05/16/21 1015     Musculoskeletal: Strength & Muscle Tone:  UTA Gait & Station:  Seated Patient leans: N/A  Psychiatric Specialty Exam: Review of Systems  Psychiatric/Behavioral:  Positive for sleep disturbance. The patient is nervous/anxious.   All other systems reviewed and are negative.  There were no vitals taken for this visit.There is no height or weight on file to calculate BMI.  General Appearance: Casual  Eye Contact:  Fair  Speech:  Clear and Coherent  Volume:  Normal  Mood:  Anxious  Affect:  Congruent  Thought Process:  Goal Directed and Descriptions of Associations: Intact  Orientation:  Full (Time, Place, and Person)  Thought Content: Logical   Suicidal Thoughts:  No  Homicidal Thoughts:  No  Memory:  Immediate;   Fair Recent;   Fair Remote;   Fair  Judgement:  Fair  Insight:  Fair  Psychomotor Activity:  Normal  Concentration:  Concentration: Fair and Attention Span: Fair  Recall:  FiservFair  Fund of Knowledge: Fair  Language: Fair  Akathisia:  No  Handed:  Right  AIMS (if indicated): done, 0  Assets:  Communication Skills Desire for Improvement Housing Social Support Transportation  ADL's:  Intact  Cognition: WNL  Sleep:  Poor   Screenings: GAD-7    Flowsheet Row Video [facility]05[facility]20[facility] Video Visit from 09/12/2021 [facility] Video [facility] Video Visit from [facility]2020 [facility] Video [facility]/26[facility]2020 [facility]  Total GAD-7 Score 14 17 10 13 13       PHQ2-9    Flowsheet Row Video [facility] Video Visit from [facility]20[facility] Video Visit from [facility]09[facility]2021 [facility]  [facility] Video Visit from [facility]20[facility]  PHQ-2 Total Score 0 0 2 2 0  PHQ-9 Total Score -- -- 6 10 7       Flowsheet Row Video Visit from 10/05/2021 in  [facility] Counselor from 07/24/2021 in [facility] Office Visit from 04/24/2021 in [facility]  C-SSRS RISK CATEGORY No Risk No Risk No Risk        Assessment and Plan: Latasha [name] is a 36 year old Caucasian female on disability, single, lives at [facility], has a history of MDD, GAD, PTSD was evaluated by telemedicine today.  Patient with anxiety and sleep problems.  Patient with adverse side effects to mirtazapine.  Will benefit from the following plan.  Plan GAD-unstable Continue BuSpar at reduced dose to 15 mg p.o. twice daily Discontinue mirtazapine for side effects Start Seroquel 12.5-25 mg p.o. nightly Continue Zoloft 200 mg p.o. daily Hydroxyzine 25 mg p.o. twice daily as needed for severe anxiety attacks  PTSD-improving Zoloft 200 mg p.o. daily Continue CBT with Ms. Christina Hussami.  MDD in remission Zoloft and BuSpar as prescribed  Anorexia nervosa-stable Continue CBT  Primary insomnia-unstable Discontinue mirtazapine for side effects Start Seroquel 12.5-25 mg p.o. nightly.  Provided medication education, discussed side effects including metabolic syndrome, tardive dyskinesia. Continue mouthguard for bruxism.  At risk for prolonged QT syndrome-we will order EKG.  Provided phone [phone].  Follow-up in clinic in 2 weeks or sooner if needed.  Collaboration of Care: Collaboration of Care: Referral or follow-up with counselor/therapist AEB encouraged to continue psychotherapy sessions with her therapist here at the practice.  Patient/Guardian was advised Release of Information must be obtained prior to any record release  in order to collaborate their care with an outside provider. Patient/Guardian was advised if they have not already done so to contact the registration department to sign all necessary forms in order for [place] to release information regarding their care.   Consent: Patient/Guardian gives verbal consent for treatment and assignment of benefits for services provided during this visit. Patient/Guardian expressed understanding and agreed to proceed.   This note was generated in part or whole with voice recognition software. Voice recognition is usually quite accurate but there are transcription errors that can and very often do occur. I apologize for any typographical errors that were not detected and corrected.      Jomarie Longs, MD 10/05/2021, 8:59 AM

## 2021-10-12 ENCOUNTER — Other Ambulatory Visit: Payer: Self-pay

## 2021-10-12 ENCOUNTER — Ambulatory Visit
Admission: RE | Admit: 2021-10-12 | Discharge: 2021-10-12 | Disposition: A | Discharge status: Home / Self Care | Payer: Medicare Other | Source: Ambulatory Visit | Attending: Psychiatry | Admitting: Psychiatry

## 2021-10-12 DIAGNOSIS — Z9189 Other specified personal risk factors, not elsewhere classified: Secondary | ICD-10-CM | POA: Insufficient documentation

## 2021-10-12 DIAGNOSIS — F4310 Post-traumatic stress disorder, unspecified: Secondary | ICD-10-CM | POA: Insufficient documentation

## 2021-10-12 DIAGNOSIS — F411 Generalized anxiety disorder: Secondary | ICD-10-CM | POA: Insufficient documentation

## 2021-10-12 DIAGNOSIS — F339 Major depressive disorder, recurrent, unspecified: Secondary | ICD-10-CM | POA: Insufficient documentation

## 2021-10-16 ENCOUNTER — Ambulatory Visit (INDEPENDENT_AMBULATORY_CARE_PROVIDER_SITE_OTHER): Payer: Medicare Other | Admitting: Licensed Clinical Social Worker

## 2021-10-16 ENCOUNTER — Other Ambulatory Visit: Payer: Self-pay

## 2021-10-16 DIAGNOSIS — F4310 Post-traumatic stress disorder, unspecified: Secondary | ICD-10-CM | POA: Diagnosis not present

## 2021-10-16 NOTE — Plan of Care (Signed)
°  Problem: Decrease depressive symptoms and improve levels of effective functioning Goal: LTG: Reduce frequency, intensity, and duration of depression symptoms as evidenced by: SSB input needed on appropriate metric Outcome: Progressing Goal: STG: Latasha [name] WILL PARTICIPATE IN AT LEAST 80% OF SCHEDULED INDIVIDUAL PSYCHOTHERAPY SESSIONS Outcome: Progressing Intervention: Encourage new environment or opportunities for social interaction Intervention: Encourage verbalization of feelings/concerns/expectations   Problem: Reduce the negative impact trauma related symptoms have on social, occupational, and family functioning. Goal: LTG: Reduce frequency, intensity, and duration of PTSD symptoms so daily functioning is improved: Input needed on appropriate metric.  pt self report Outcome: Not Progressing Goal: STG: Latasha [name] WILL EXPERIENCE A 80% REDUCTION IN EXAGGERATED BELIEFS ABOUT SELF AND OTHERS THAT INTERFERE WITH TRAUMA RESOLUTION AS EVIDENCED BY SELF-REPORT Outcome: Progressing Intervention: Continue cognitive behavioral therapy for ERP--exposure and response prevention Intervention: Assist with relaxation techniques, as appropriate (deep breathing exercises, meditation, guided imagery) Intervention: Encourage family support Intervention: Monitor coping skills and behavior

## 2021-10-16 NOTE — Progress Notes (Signed)
Virtual Visit via Video Note  I connected with Latasha [name] on 10/16/21 at  9:00 AM EST by a video enabled telemedicine application and verified that I am speaking with the correct person using two identifiers.  Location: Patient: home Provider: remote office [place], [place])   I discussed the limitations of evaluation and management by telemedicine and the availability of in person appointments. The patient expressed understanding and agreed to proceed.   I discussed the assessment and treatment plan with the patient. The patient was provided an opportunity to ask questions and all were answered. The patient agreed with the plan and demonstrated an understanding of the instructions.   The patient was advised to call back or seek an in-person evaluation if the symptoms worsen or if the condition fails to improve as anticipated.  I provided 45 minutes of non-face-to-face time during this encounter.   Girolamo Lortie R Tomma Ehinger, LCSW   THERAPIST PROGRESS NOTE  Session Time: [phone]  Participation Level: Active  Behavioral Response: Neat and Well GroomedAlertAnxious  Type of Therapy: Individual Therapy  Treatment Goals addressed: Problem: Reduce the negative impact trauma related symptoms have on social, occupational, and family functioning. Goal: LTG: Reduce frequency, intensity, and duration of PTSD symptoms so daily functioning is improved: Input needed on appropriate metric.  pt self report--3 out of 5 sessions documented Outcome: Not Progressing  ProgressTowards Goals: Not Progressing  Interventions: CBT and Solution Focused  Summary: Latasha [name] is a 36 y.o. female who presents with continuing symptoms related to PTSD and depression. Pt reports several illness-related incidents that have triggered anxiety, panic, and fainting episodes. Pt reports that while she took one child to the pediatrician, she was unable to drive back and her father had to drive there to pick them  up and take them home. Pt reports that this situation made her feel really bad for her father.   Allowed pt to explore and express thoughts and feelings associated with recent life situations and external stressors. Discussed the incident with her son getting sick including vomiting at the pediatrician's office. Discussed how this was an anxiety-provoking incident and how pt tried hard to prevent the anxiety response. Allowed pt to brainstorm and discussed ways that pt could have felt more in control in the situation versus "the situation controlling me". Discussed having supplies 24/7 for the kids: gloves,   Suicidal/Homicidal: No  Therapist Response: Pt is continuing to apply interventions learned in session into daily life situations. Pt is currently on track to meet goals utilizing interventions mentioned above. Personal growth and progress fluctuating/intermittent at time of session. Treatment to continue as indicated.   Plan: Return again in 4 weeks.  Diagnosis: PTSD (post-traumatic stress disorder)  Collaboration of Care: Other Pt encouraged to continue medication management services with psychiatrist of record, Dr. Elna Breslow  Patient/Guardian was advised Release of Information must be obtained prior to any record release in order to collaborate their care with an outside provider. Patient/Guardian was advised if they have not already done so to contact the registration department to sign all necessary forms in order for [place] to release information regarding their care.   Consent: Patient/Guardian gives verbal consent for treatment and assignment of benefits for services provided during this visit. Patient/Guardian expressed understanding and agreed to proceed.   [name] Aarush Stukey, LCSW 10/16/2021

## 2021-10-22 ENCOUNTER — Encounter: Payer: Self-pay | Admitting: Psychiatry

## 2021-10-22 ENCOUNTER — Telehealth (INDEPENDENT_AMBULATORY_CARE_PROVIDER_SITE_OTHER): Payer: Medicare Other | Admitting: Psychiatry

## 2021-10-22 ENCOUNTER — Other Ambulatory Visit: Payer: Self-pay

## 2021-10-22 DIAGNOSIS — Z9189 Other specified personal risk factors, not elsewhere classified: Secondary | ICD-10-CM

## 2021-10-22 DIAGNOSIS — F3342 Major depressive disorder, recurrent, in full remission: Secondary | ICD-10-CM | POA: Diagnosis not present

## 2021-10-22 DIAGNOSIS — F411 Generalized anxiety disorder: Secondary | ICD-10-CM

## 2021-10-22 DIAGNOSIS — F4310 Post-traumatic stress disorder, unspecified: Secondary | ICD-10-CM | POA: Diagnosis not present

## 2021-10-22 DIAGNOSIS — F5000 Anorexia nervosa, unspecified: Secondary | ICD-10-CM

## 2021-10-22 DIAGNOSIS — F5101 Primary insomnia: Secondary | ICD-10-CM

## 2021-10-22 MED ORDER — ESCITALOPRAM OXALATE 5 MG PO TABS: 5.0000 mg | ORAL_TABLET | Freq: Every day | ORAL | 0 refills | Status: DC

## 2021-10-22 MED ORDER — ZOLPIDEM TARTRATE 5 MG PO TABS: 5.0000 mg | ORAL_TABLET | Freq: Every evening | ORAL | 0 refills | Status: DC | PRN

## 2021-10-22 MED ORDER — SERTRALINE HCL 100 MG PO TABS: 100.0000 mg | ORAL_TABLET | Freq: Every day | ORAL | 2 refills | Status: DC

## 2021-10-22 NOTE — Progress Notes (Signed)
Virtual Visit via Video Note  I connected with Latasha [name] on 10/22/21 at  4:00 PM EST by a video enabled telemedicine application and verified that I am speaking with the correct person using two identifiers.  Location Provider Location : [facility] Patient Location : Home  Participants: Patient , Provider    I discussed the limitations of evaluation and management by telemedicine and the availability of in person appointments. The patient expressed understanding and agreed to proceed.    I discussed the assessment and treatment plan with the patient. The patient was provided an opportunity to ask questions and all were answered. The patient agreed with the plan and demonstrated an understanding of the instructions.   The patient was advised to call back or seek an in-person evaluation if the symptoms worsen or if the condition fails to improve as anticipated.    [facility] MD OP Progress Note  10/22/2021 4:22 PM Latasha [name]  MRN:  [id number]  Chief Complaint:  Chief Complaint  Patient presents with   Follow-up: 36 year old Caucasian female with history of GAD, MDD, PTSD, anorexia nervosa, primary insomnia was evaluated for medication management.   HPI: Latasha [name] is a 36 year old Caucasian female, single, on SSD, lives in [place], lives with her children and her parents, history of MDD, GAD, PTSD, anorexia nervosa, primary insomnia, Hirschsprung's disease, iron deficiency anemia, chronic constipation, history of chronic fatigue syndrome was evaluated by telemedicine today.  Patient today returns reporting that she could not tolerate the Seroquel.  She reports she already struggles with low blood pressure and feels dizzy or lightheaded often, and the Seroquel made it worse for her.  She hence stopped taking it.  Patient used to be on Ambien in the past which worked well for her.  Agreeable to going back on it.  Patient continues to have sleep problems, is often worried, anxious.   Patient continues to be interested in medication changes.  Denies any depressive symptoms.  Reports appetite continues to be fair.  Denies any anorexic episodes.  Continues to be motivated to stay in therapy with her therapist.  Denies any suicidality, homicidality or perceptual disturbances.  Patient is compliant on the sertraline and BuSpar.  Denies side effects.    Visit Diagnosis:    ICD-10-CM   1. GAD (generalized anxiety disorder)  F41.1 sertraline (ZOLOFT) 100 MG tablet    escitalopram (LEXAPRO) 5 MG tablet    2. MDD (major depressive disorder), recurrent, in full remission (HCC)  F33.42 sertraline (ZOLOFT) 100 MG tablet    escitalopram (LEXAPRO) 5 MG tablet    3. PTSD (post-traumatic stress disorder)  F43.10 sertraline (ZOLOFT) 100 MG tablet    4. Anorexia nervosa  F50.00     5. Primary insomnia  F51.01 zolpidem (AMBIEN) 5 MG tablet    6. At risk for prolonged QT interval syndrome  Z91.89       Past Psychiatric History: Reviewed past psychiatric history from progress note on 09/26/2020.  Past trials of Seroquel, Zoloft, Wellbutrin, BuSpar, Prozac, Paxil, Xanax Effexor, Cymbalta, Lexapro.  Past Medical History:  Past Medical History:  Diagnosis Date   Acid reflux    Anemia    Anxiety    Asthma    Chronic constipation    Hirschsprung disease    Pelvic floor dysfunction    Syncope, cardiogenic     Past Surgical History:  Procedure Laterality Date   abdomial adhesions removed  2012   APPENDECTOMY  1997   bladder repair surgery  1997   c- sections  [id number]   enodmetiosis removed  2012   OVARIAN CYST REMOVAL  2003, 2012   TONSILLECTOMY  2007    Family Psychiatric History: Reviewed family psychiatric history from progress note on 09/26/2020.  Family History:  Family History  Problem Relation Age of Onset   Cancer Mother    Diabetes Mother    Hypertension Mother    Cancer Father    Alcohol abuse Father    Depression Father    Cancer Maternal  Grandfather    Factor IX deficiency Maternal Grandfather    Factor IX deficiency Son    ADD / ADHD Son    Autism Son    Kidney cancer Neg Hx    Bladder Cancer Neg Hx     Social History: Reviewed social history from progress note on 09/26/2020. Social History   Socioeconomic History   Marital status: Single    Spouse name: Not on file   Number of children: 2   Years of education: 12 th grade, some college   Highest education level: Not on file  Occupational History   Occupation: disabled  Tobacco Use   Smoking status: Never   Smokeless tobacco: Never  Substance and Sexual Activity   Alcohol use: No   Drug use: No   Sexual activity: Yes    Partners: Male    Birth control/protection: Condom, Pill  Other Topics Concern   Not on file  Social History Narrative   Not on file   Social Determinants of Health   Financial Resource Strain: Not on file  Food Insecurity: Not on file  Transportation Needs: Not on file  Physical Activity: Not on file  Stress: Not on file  Social Connections: Not on file    Allergies:  Allergies  Allergen Reactions   Meperidine Anaphylaxis    Other reaction(s): Other (See Comments) Other Reaction: Not Assessed   Lactose     Other reaction(s): Other (See Comments) Other Reaction: GI Upset   Fludrocortisone Hives   Levofloxacin Hives   Sulfamethoxazole-Trimethoprim Other (See Comments)    Other reaction(s): Dizziness    Metabolic Disorder Labs: No results found for: HGBA1C, MPG No results found for: PROLACTIN No results found for: CHOL, TRIG, HDL, CHOLHDL, VLDL, LDLCALC Lab Results  Component Value Date   TSH 1.276 10/30/2020    Therapeutic Level Labs: No results found for: LITHIUM No results found for: VALPROATE No components found for:  CBMZ  Current Medications: Current Outpatient Medications  Medication Sig Dispense Refill   escitalopram (LEXAPRO) 5 MG tablet Take 1 tablet (5 mg total) by mouth daily with supper. 30 tablet 0    zolpidem (AMBIEN) 5 MG tablet Take 1 tablet (5 mg total) by mouth at bedtime as needed for sleep. 15 tablet 0   Acidophilus Lactobacillus CAPS Take 1 tablet by mouth daily.     albuterol (VENTOLIN HFA) 108 (90 Base) MCG/ACT inhaler Inhale 1 puff into the lungs every 6 (six) hours as needed for wheezing or shortness of breath.      busPIRone (BUSPAR) 15 MG tablet Take 1 tablet (15 mg total) by mouth 2 (two) times daily. 60 tablet 1   drospirenone-ethinyl estradiol (YASMIN) 3-0.03 MG tablet Take 1 tablet by mouth daily.     EPINEPHrine (EPIPEN 2-PAK) 0.3 mg/0.3 mL IJ SOAJ injection Inject 0.3 mg into the muscle as needed. Reported on 02/07/2016     fluticasone (FLONASE) 50 MCG/ACT nasal spray SHAKE LQ AND U 2 SPRAYS IEN  QD     hydrOXYzine (ATARAX/VISTARIL) 25 MG tablet Take 1 tablet (25 mg total) by mouth 2 (two) times daily as needed. Severe anxiety attacks only 60 tablet 1   metroNIDAZOLE (FLAGYL) 500 MG tablet Take by mouth.     midodrine (PROAMATINE) 10 MG tablet Take 10 mg by mouth daily.      norethindrone-ethinyl estradiol (LOESTRIN) 1-20 MG-MCG tablet Take 1 tablet by mouth daily.     omeprazole (PRILOSEC) 40 MG capsule   0   ondansetron (ZOFRAN) 4 MG tablet Take 4 mg by mouth every 8 (eight) hours as needed for nausea.     ondansetron (ZOFRAN-ODT) 4 MG disintegrating tablet Take by mouth.     Plecanatide 3 MG TABS Take 3 mg by mouth daily.  (Patient not taking: Reported on 09/26/2020)     promethazine (PHENERGAN) 25 MG tablet Take 25 mg by mouth every 8 (eight) hours as needed for vomiting.     promethazine (PHENERGAN) 25 MG tablet Take by mouth.     sertraline (ZOLOFT) 100 MG tablet Take 1 tablet (100 mg total) by mouth daily. 60 tablet 2   Sodium Phosphates (RA ENEMA) 7-19 GM/118ML ENEM Place rectally as needed (constipation).     No current facility-administered medications for this visit.   Facility-Administered Medications Ordered in Other Visits  Medication Dose Route Frequency  Provider Last Rate Last Admin   0.9 %  sodium chloride infusion   Intravenous Continuous Creig Hines, MD   Stopped at 05/09/21 1055   0.9 %  sodium chloride infusion   Intravenous Continuous Mauro Kaufmann, NP   Stopped at 05/11/21 1051   0.9 %  sodium chloride infusion   Intravenous Continuous Creig Hines, MD 10 mL/hr at 05/16/21 1015 New Bag at 05/16/21 1015     Musculoskeletal: Strength & Muscle Tone:  UTA Gait & Station:  Seated Patient leans: N/A  Psychiatric Specialty Exam: Review of Systems  Psychiatric/Behavioral:  Positive for sleep disturbance. The patient is nervous/anxious.   All other systems reviewed and are negative.  There were no vitals taken for this visit.There is no height or weight on file to calculate BMI.  General Appearance: Casual  Eye Contact:  Fair  Speech:  Clear and Coherent  Volume:  Normal  Mood:  Anxious  Affect:  Congruent  Thought Process:  Goal Directed and Descriptions of Associations: Intact  Orientation:  Full (Time, Place, and Person)  Thought Content: Logical   Suicidal Thoughts:  No  Homicidal Thoughts:  No  Memory:  Immediate;   Fair Recent;   Fair Remote;   Fair  Judgement:  Fair  Insight:  Fair  Psychomotor Activity:  Normal  Concentration:  Concentration: Fair and Attention Span: Fair  Recall:  [employer] of Knowledge: Fair  Language: Fair  Akathisia:  No  Handed:  Right  AIMS (if indicated): not done  Assets:  Communication Skills Desire for Improvement Housing Social Support  ADL's:  Intact  Cognition: WNL  Sleep:  Poor   Screenings: GAD-7    Flowsheet Row Video Visit from 10/22/2021 in [facility] Video Visit from 10/05/2021 in [facility] Video Visit from 09/12/2021 in [facility] Video Visit from 01/31/2021 in [facility] Video Visit from 11/29/2020 in [facility]   Total GAD-7 Score 10 14 17 10 13       PHQ2-9    Flowsheet Row Video Visit from 10/22/2021 in [facility] Counselor from 10/16/2021 in [facility] Video Visit from 10/05/2021 in [facility] Video Visit from 09/12/2021 in [facility] Video Visit from 07/09/2021 in [facility]  PHQ-2 Total Score 0 0 0 0 2  PHQ-9 Total Score -- -- -- -- 6      Flowsheet Row Counselor from 10/16/2021 in [facility] Video Visit from 10/05/2021 in [facility] Counselor from 07/24/2021 in [facility]  C-SSRS RISK CATEGORY No Risk No Risk No Risk        Assessment and Plan: TESHA FOLGER is a 36 year old Caucasian female on disability, single, lives at [facility], has a history of MDD, GAD, PTSD was evaluated by telemedicine today.  Patient with adverse side effect of Seroquel, continues to have sleep and anxiety problems and will benefit from the following medication changes.  Plan GAD-unstable BuSpar 15 mg p.o. twice daily-reduced dosage Discontinue Seroquel due to side effects. Start tapering off Zoloft, will reduce Zoloft to 100 mg p.o. daily in the morning.  Advised to take it in the morning if she tolerates it okay. Start Lexapro 5 mg p.o. daily in the evening, with supper or at bedtime.  Patient reports previous drowsiness from Lexapro and that is why it was advised to take it in the evening. Hydroxyzine 25 mg p.o. twice daily as needed for severe anxiety attacks  PTSD-improving Zoloft 100 mg p.o. daily Continue CBT with Ms.Christina Hussami  MDD in remission Zoloft and BuSpar as prescribed  Anorexia nervosa-stable Continue CBT  Primary insomnia-unstable Discontinue Seroquel due to side effects. Restart Ambien 5 mg p.o. nightly as needed Reviewed [place] PMP aware.  At  risk for prolonged QT syndrome-reviewed and discussed EKG dated October 12, 2021-sinus bradycardia, otherwise normal EKG, [id number].  Follow-up in clinic in 1 week or sooner if needed.   Collaboration of Care: Collaboration of Care: Other encouraged to continue to follow up with her therapist.  Patient/Guardian was advised Release of Information must be obtained prior to any record release in order to collaborate their care with an outside provider. Patient/Guardian was advised if they have not already done so to contact the registration department to sign all necessary forms in order for [place] to release information regarding their care.   Consent: Patient/Guardian gives verbal consent for treatment and assignment of benefits for services provided during this visit. Patient/Guardian expressed understanding and agreed to proceed.   This note was generated in part or whole with voice recognition software. Voice recognition is usually quite accurate but there are transcription errors that can and very often do occur. I apologize for any typographical errors that were not detected and corrected.      Jomarie Longs, MD 10/22/2021, 4:22 PM

## 2021-10-29 ENCOUNTER — Telehealth (INDEPENDENT_AMBULATORY_CARE_PROVIDER_SITE_OTHER): Payer: Medicare Other | Admitting: Psychiatry

## 2021-10-29 ENCOUNTER — Encounter: Payer: Self-pay | Admitting: Psychiatry

## 2021-10-29 ENCOUNTER — Other Ambulatory Visit: Payer: Self-pay

## 2021-10-29 DIAGNOSIS — F5000 Anorexia nervosa, unspecified: Secondary | ICD-10-CM

## 2021-10-29 DIAGNOSIS — F411 Generalized anxiety disorder: Secondary | ICD-10-CM

## 2021-10-29 DIAGNOSIS — F3342 Major depressive disorder, recurrent, in full remission: Secondary | ICD-10-CM

## 2021-10-29 DIAGNOSIS — F4310 Post-traumatic stress disorder, unspecified: Secondary | ICD-10-CM | POA: Diagnosis not present

## 2021-10-29 DIAGNOSIS — F5101 Primary insomnia: Secondary | ICD-10-CM

## 2021-10-29 MED ORDER — SERTRALINE HCL 50 MG PO TABS: 25.0000 mg | ORAL_TABLET | Freq: Every day | ORAL | 1 refills | Status: DC

## 2021-10-29 MED ORDER — ZOLPIDEM TARTRATE 5 MG PO TABS: 5.0000 mg | ORAL_TABLET | Freq: Every evening | ORAL | 1 refills | Status: DC | PRN

## 2021-10-29 MED ORDER — ESCITALOPRAM OXALATE 10 MG PO TABS: 10.0000 mg | ORAL_TABLET | Freq: Every day | ORAL | 1 refills | Status: DC

## 2021-10-29 NOTE — Progress Notes (Signed)
Virtual Visit via Video Note  I connected with Latasha [name] on 10/29/21 at 11:30 AM EDT by a video enabled telemedicine application and verified that I am speaking with the correct person using two identifiers.  Location Provider Location : [facility] Patient Location : Home  Participants: Patient , Provider   I discussed the limitations of evaluation and management by telemedicine and the availability of in person appointments. The patient expressed understanding and agreed to proceed.    I discussed the assessment and treatment plan with the patient. The patient was provided an opportunity to ask questions and all were answered. The patient agreed with the plan and demonstrated an understanding of the instructions.   The patient was advised to call back or seek an in-person evaluation if the symptoms worsen or if the condition fails to improve as anticipated.   BH MD OP Progress Note  10/29/2021 1:10 PM MICKIE BADDERS  MRN:  [id number]  Chief Complaint:  Chief Complaint  Patient presents with   Follow-up: 36 year old Caucasian female with history of GAD, MDD, PTSD, anorexia nervosa, primary insomnia was evaluated for medication management.   HPI: Latasha [name] is a 36 year old Caucasian female, single, on SSD, lives in [place], has a history of MDD, GAD, PTSD, anorexia nervosa, primary insomnia, Hirschsprung's disease, iron deficiency anemia, chronic constipation, history of chronic fatigue syndrome was evaluated by telemedicine today.  Patient continues to struggle with anxiety symptoms, generalized worry, feeling overwhelmed and on edge often.  Patient is currently being tapered off of the Zoloft and Lexapro is being titrated up.  Patient reports she had significant headaches when she cut back on the Zoloft to100 mg.  She hence went back to 150 mg and that helped with the headaches.  She is currently taking Zoloft 150 mg and Lexapro 5 mg.  Denies any side effects to  Lexapro.  Reports she has started sleeping better on the zolpidem.  She however uses it only every other night or so since when she took it every night in the past it stopped working.  She reports the nights that she takes the zolpidem sleep is good.  Denies side effects.  Denies any suicidality, homicidality or perceptual disturbances.  Patient reports appetite is fair.  Patient is compliant with psychotherapy sessions.  Denies any other concerns today.     Visit Diagnosis:    ICD-10-CM   1. GAD (generalized anxiety disorder)  F41.1     2. MDD (major depressive disorder), recurrent, in full remission (HCC)  F33.42 sertraline (ZOLOFT) 50 MG tablet    3. PTSD (post-traumatic stress disorder)  F43.10 sertraline (ZOLOFT) 50 MG tablet    4. Anorexia nervosa  F50.00     5. Primary insomnia  F51.01 zolpidem (AMBIEN) 5 MG tablet    escitalopram (LEXAPRO) 10 MG tablet      Past Psychiatric History: Reviewed past psychiatric history from progress note on 09/26/2020.  Past trials of Seroquel, Zoloft, Wellbutrin, BuSpar, Prozac, Paxil, Xanax, Effexor, Cymbalta, Lexapro.  Past Medical History:  Past Medical History:  Diagnosis Date   Acid reflux    Anemia    Anxiety    Asthma    Chronic constipation    Hirschsprung disease    Pelvic floor dysfunction    Syncope, cardiogenic     Past Surgical History:  Procedure Laterality Date   abdomial adhesions removed  2012   APPENDECTOMY  1997   bladder repair surgery  1997   c- sections  2013,2014  enodmetiosis removed  2012   OVARIAN CYST REMOVAL  2003, 2012   TONSILLECTOMY  2007    Family Psychiatric History: Reviewed family psychiatric history from progress note on 09/26/2020.  Family History:  Family History  Problem Relation Age of Onset   Cancer Mother    Diabetes Mother    Hypertension Mother    Cancer Father    Alcohol abuse Father    Depression Father    Cancer Maternal Grandfather    Factor IX deficiency Maternal  Grandfather    Factor IX deficiency Son    ADD / ADHD Son    Autism Son    Kidney cancer Neg Hx    Bladder Cancer Neg Hx     Social History: Reviewed social history from progress note on 09/26/2020. Social History   Socioeconomic History   Marital status: Single    Spouse name: Not on file   Number of children: 2   Years of education: 12 th grade, some college   Highest education level: Not on file  Occupational History   Occupation: disabled  Tobacco Use   Smoking status: Never   Smokeless tobacco: Never  Substance and Sexual Activity   Alcohol use: No   Drug use: No   Sexual activity: Yes    Partners: Male    Birth control/protection: Condom, Pill  Other Topics Concern   Not on file  Social History Narrative   Not on file   Social Determinants of Health   Financial Resource Strain: Not on file  Food Insecurity: Not on file  Transportation Needs: Not on file  Physical Activity: Not on file  Stress: Not on file  Social Connections: Not on file    Allergies:  Allergies  Allergen Reactions   Meperidine Anaphylaxis    Other reaction(s): Other (See Comments) Other Reaction: Not Assessed   Lactose     Other reaction(s): Other (See Comments) Other Reaction: GI Upset   Fludrocortisone Hives   Levofloxacin Hives   Sulfamethoxazole-Trimethoprim Other (See Comments)    Other reaction(s): Dizziness    Metabolic Disorder Labs: No results found for: HGBA1C, MPG No results found for: PROLACTIN No results found for: CHOL, TRIG, HDL, CHOLHDL, VLDL, LDLCALC Lab Results  Component Value Date   TSH 1.276 10/30/2020    Therapeutic Level Labs: No results found for: LITHIUM No results found for: VALPROATE No components found for:  CBMZ  Current Medications: Current Outpatient Medications  Medication Sig Dispense Refill   escitalopram (LEXAPRO) 10 MG tablet Take 1 tablet (10 mg total) by mouth daily. 30 tablet 1   sertraline (ZOLOFT) 50 MG tablet Take 0.5-1 tablets  (25-50 mg total) by mouth daily. Take 1 tablet for 1 week and half tablet for another week and stop taking 14 tablet 1   Acidophilus Lactobacillus CAPS Take 1 tablet by mouth daily.     albuterol (VENTOLIN HFA) 108 (90 Base) MCG/ACT inhaler Inhale 1 puff into the lungs every 6 (six) hours as needed for wheezing or shortness of breath.      busPIRone (BUSPAR) 15 MG tablet Take 1 tablet (15 mg total) by mouth 2 (two) times daily. 60 tablet 1   drospirenone-ethinyl estradiol (YASMIN) 3-0.03 MG tablet Take 1 tablet by mouth daily.     EPINEPHrine (EPIPEN 2-PAK) 0.3 mg/0.3 mL IJ SOAJ injection Inject 0.3 mg into the muscle as needed. Reported on 02/07/2016     fluticasone (FLONASE) 50 MCG/ACT nasal spray SHAKE LQ AND U 2 SPRAYS IEN QD  hydrOXYzine (ATARAX/VISTARIL) 25 MG tablet Take 1 tablet (25 mg total) by mouth 2 (two) times daily as needed. Severe anxiety attacks only 60 tablet 1   metroNIDAZOLE (FLAGYL) 500 MG tablet Take by mouth.     midodrine (PROAMATINE) 10 MG tablet Take 10 mg by mouth daily.      norethindrone-ethinyl estradiol (LOESTRIN) 1-20 MG-MCG tablet Take 1 tablet by mouth daily.     omeprazole (PRILOSEC) 40 MG capsule   0   ondansetron (ZOFRAN) 4 MG tablet Take 4 mg by mouth every 8 (eight) hours as needed for nausea.     ondansetron (ZOFRAN-ODT) 4 MG disintegrating tablet Take by mouth.     Plecanatide 3 MG TABS Take 3 mg by mouth daily.  (Patient not taking: Reported on 09/26/2020)     promethazine (PHENERGAN) 25 MG tablet Take 25 mg by mouth every 8 (eight) hours as needed for vomiting.     promethazine (PHENERGAN) 25 MG tablet Take by mouth.     Sodium Phosphates (RA ENEMA) 7-19 GM/118ML ENEM Place rectally as needed (constipation).     zolpidem (AMBIEN) 5 MG tablet Take 1 tablet (5 mg total) by mouth at bedtime as needed for sleep. 30 tablet 1   No current facility-administered medications for this visit.   Facility-Administered Medications Ordered in Other Visits   Medication Dose Route Frequency Provider Last Rate Last Admin   0.9 %  sodium chloride infusion   Intravenous Continuous Creig Hinesao, Archana C, MD   Stopped at 05/09/21 1055   0.9 %  sodium chloride infusion   Intravenous Continuous Mauro KaufmannBurns, Jennifer E, NP   Stopped at 05/11/21 1051   0.9 %  sodium chloride infusion   Intravenous Continuous Creig Hinesao, Archana C, MD 10 mL/hr at 05/16/21 1015 New Bag at 05/16/21 1015     Musculoskeletal: Strength & Muscle Tone:  UTA Gait & Station:  Seated Patient leans: N/A  Psychiatric Specialty Exam: Review of Systems  Psychiatric/Behavioral:  Positive for sleep disturbance. The patient is nervous/anxious.   All other systems reviewed and are negative.  There were no vitals taken for this visit.There is no height or weight on file to calculate BMI.  General Appearance: Casual  Eye Contact:  Fair  Speech:  Clear and Coherent  Volume:  Normal  Mood:  Anxious  Affect:  Congruent  Thought Process:  Goal Directed and Descriptions of Associations: Intact  Orientation:  Full (Time, Place, and Person)  Thought Content: Logical   Suicidal Thoughts:  No  Homicidal Thoughts:  No  Memory:  Immediate;   Fair Recent;   Fair Remote;   Fair  Judgement:  Fair  Insight:  Fair  Psychomotor Activity:  Normal  Concentration:  Concentration: Fair and Attention Span: Fair  Recall:  FiservFair  Fund of Knowledge: Fair  Language: Fair  Akathisia:  No  Handed:  Right  AIMS (if indicated): not done  Assets:  Communication Skills Desire for Improvement Housing Social Support Transportation  ADL's:  Intact  Cognition: WNL  Sleep:   improving   Screenings: GAD-7    Flowsheet Row Video [facility]20[facility] Video [facility] Video [facility]12[facility]2021 [facility] Video Visit from [facility]/31[facility]2021 [facility] Video [facility]  [facility]  Total GAD-7 Score 10 14 17 10 13       PHQ2-9    Flowsheet Row Video Visit from 10/22/2021  in [facility] Counselor from 10/16/2021 in [facility] Video Visit from 10/05/2021 in [facility] Video Visit from 09/12/2021 in [facility] Video Visit from 07/09/2021 in [facility]  PHQ-2 Total Score 0 0 0 0 2  PHQ-9 Total Score -- -- -- -- 6      Flowsheet Row Counselor from 10/16/2021 in [facility] Video Visit from 10/05/2021 in [facility] Counselor from 07/24/2021 in [facility]  C-SSRS RISK CATEGORY No Risk No Risk No Risk        Assessment and Plan: BRAYLEE LAL is a 36 year old Caucasian female on disability, single, lives at [facility], has a history of MDD, GAD, PTSD was evaluated by telemedicine today.  Patient with anxiety symptoms currently cross titrating Lexapro with Zoloft.  Patient will benefit from the following plan.  Plan GAD-unstable Increase Lexapro to 10 mg daily Taper off Zoloft, start Zoloft 50 mg p.o. daily for the next 1 week, Zoloft 25 mg p.o. daily for a week after that and stop taking. Hydroxyzine 25 mg p.o. twice daily as needed for severe anxiety attacks Continue CBT with Ms. Christina Hussami  PTSD-improving Increase Lexapro to 10 mg p.o. daily Continue CBT Continue BuSpar 15 mg p.o. twice daily-reduced dosage  MDD in remission Continue medications as noted above   Anorexia nervosa-stable Continue CBT  Primary insomnia-improving Zolpidem 5 mg p.o. nightly Reviewed [place] PMP aware  Discussed drug to drug interaction including serotonin syndrome, patient to go to the nearest urgent care if she has any significant side effects.  Follow-up in clinic in 4 to 6 weeks or sooner  if needed.   Collaboration of Care: Collaboration of Care: Referral or follow-up with counselor/therapist AEB encouraged to continue to follow up with a therapist.  Patient/Guardian was advised Release of Information must be obtained prior to any record release in order to collaborate their care with an outside provider. Patient/Guardian was advised if they have not already done so to contact the registration department to sign all necessary forms in order for [place] to release information regarding their care.   Consent: Patient/Guardian gives verbal consent for treatment and assignment of benefits for services provided during this visit. Patient/Guardian expressed understanding and agreed to proceed.   This note was generated in part or whole with voice recognition software. Voice recognition is usually quite accurate but there are transcription errors that can and very often do occur. I apologize for any typographical errors that were not detected and corrected.      Jomarie Longs, MD 10/29/2021, 1:10 PM

## 2021-11-14 ENCOUNTER — Other Ambulatory Visit: Payer: Self-pay | Admitting: *Deleted

## 2021-11-14 DIAGNOSIS — D509 Iron deficiency anemia, unspecified: Secondary | ICD-10-CM

## 2021-11-22 ENCOUNTER — Inpatient Hospital Stay: Payer: Medicare Other | Attending: Nurse Practitioner

## 2021-11-22 DIAGNOSIS — D500 Iron deficiency anemia secondary to blood loss (chronic): Secondary | ICD-10-CM | POA: Insufficient documentation

## 2021-11-22 DIAGNOSIS — N920 Excessive and frequent menstruation with regular cycle: Secondary | ICD-10-CM | POA: Insufficient documentation

## 2021-11-22 DIAGNOSIS — D509 Iron deficiency anemia, unspecified: Secondary | ICD-10-CM

## 2021-11-22 LAB — CBC WITH DIFFERENTIAL/PLATELET
Abs Immature Granulocytes: 0.01 10*3/uL (ref 0.00–0.07)
Basophils Absolute: 0 10*3/uL (ref 0.0–0.1)
Basophils Relative: 1 %
Eosinophils Absolute: 0.1 10*3/uL (ref 0.0–0.5)
Eosinophils Relative: 2 %
HCT: 39.6 % (ref 36.0–46.0)
Hemoglobin: 13 g/dL (ref 12.0–15.0)
Immature Granulocytes: 0 %
Lymphocytes Relative: 33 %
Lymphs Abs: 2.2 10*3/uL (ref 0.7–4.0)
MCH: 31.3 pg (ref 26.0–34.0)
MCHC: 32.8 g/dL (ref 30.0–36.0)
MCV: 95.4 fL (ref 80.0–100.0)
Monocytes Absolute: 0.6 10*3/uL (ref 0.1–1.0)
Monocytes Relative: 9 %
Neutro Abs: 3.6 10*3/uL (ref 1.7–7.7)
Neutrophils Relative %: 55 %
Platelets: 209 10*3/uL (ref 150–400)
RBC: 4.15 MIL/uL (ref 3.87–5.11)
RDW: 11.9 % (ref 11.5–15.5)
WBC: 6.5 10*3/uL (ref 4.0–10.5)
nRBC: 0 % (ref 0.0–0.2)

## 2021-11-22 LAB — IRON AND TIBC
Iron: 127 ug/dL (ref 28–170)
Saturation Ratios: 40 % — ABNORMAL HIGH (ref 10.4–31.8)
TIBC: 321 ug/dL (ref 250–450)
UIBC: 194 ug/dL

## 2021-11-22 LAB — FERRITIN: Ferritin: 49 ng/mL (ref 11–307)

## 2021-11-30 ENCOUNTER — Inpatient Hospital Stay: Payer: Medicare Other

## 2021-11-30 ENCOUNTER — Encounter: Payer: Self-pay | Admitting: Psychiatry

## 2021-11-30 ENCOUNTER — Ambulatory Visit (INDEPENDENT_AMBULATORY_CARE_PROVIDER_SITE_OTHER): Payer: Medicare Other | Admitting: Licensed Clinical Social Worker

## 2021-11-30 ENCOUNTER — Inpatient Hospital Stay: Payer: Medicare Other | Admitting: Internal Medicine

## 2021-11-30 ENCOUNTER — Telehealth (INDEPENDENT_AMBULATORY_CARE_PROVIDER_SITE_OTHER): Payer: Medicare Other | Admitting: Psychiatry

## 2021-11-30 DIAGNOSIS — F5000 Anorexia nervosa, unspecified: Secondary | ICD-10-CM | POA: Diagnosis not present

## 2021-11-30 DIAGNOSIS — F4310 Post-traumatic stress disorder, unspecified: Secondary | ICD-10-CM | POA: Diagnosis not present

## 2021-11-30 DIAGNOSIS — F411 Generalized anxiety disorder: Secondary | ICD-10-CM

## 2021-11-30 DIAGNOSIS — F3342 Major depressive disorder, recurrent, in full remission: Secondary | ICD-10-CM

## 2021-11-30 DIAGNOSIS — F5101 Primary insomnia: Secondary | ICD-10-CM

## 2021-11-30 MED ORDER — ESCITALOPRAM OXALATE 10 MG PO TABS: 15.0000 mg | ORAL_TABLET | Freq: Every day | ORAL | 1 refills | Status: DC

## 2021-11-30 NOTE — Progress Notes (Signed)
Virtual Visit via Video Note ? ?I connected with Latasha [name] on 11/30/21 at 10:20 AM EDT by a video enabled telemedicine application and verified that I am speaking with the correct person using two identifiers. ? ?Location ?Provider Location : [facility] ?Patient Location : Home ? ?Participants: Patient , Provider ? ?  ?I discussed the limitations of evaluation and management by telemedicine and the availability of in person appointments. The patient expressed understanding and agreed to proceed. ?  ?I discussed the assessment and treatment plan with the patient. The patient was provided an opportunity to ask questions and all were answered. The patient agreed with the plan and demonstrated an understanding of the instructions. ?  ?The patient was advised to call back or seek an in-person evaluation if the symptoms worsen or if the condition fails to improve as anticipated. ? ? ? ?BH MD OP Progress Note ? ?11/30/2021 12:54 PM ?Latasha [name]  ?MRN:  [id number] ? ?Chief Complaint:  ?Chief Complaint  ?Patient presents with  ? Follow-up: 36 year old Caucasian female with history of MDD, PTSD, anorexia nervosa, insomnia was evaluated for medication management.  ? ?HPI: Latasha [name] is a 36 year old Caucasian female, single, on SSD, lives in [place], has a history of PTSD, anorexia nervosa, primary insomnia, Hirschsprung's disease, iron deficiency anemia, chronic constipation, chronic fatigue syndrome was evaluated by telemedicine today. ? ?Patient today reports she has noticed that her mood gets worse around the time of her menstrual cycle.  She reports 2 weeks prior to her menstrual period as well as the week of her period she feels irritable, has sadness, anxiety, sleep problems.  She reports she has not noticed much benefit from any of the medications she has tried with these symptoms.  She however would like to give the Lexapro a chance and is interested in dosage increase. ? ?Patient reports her eating  disorder problems, anorexia also gets worse around this time.  Currently reports appetite is fair. ? ?Patient denies any suicidality, homicidality or perceptual disturbances. ? ?Continues to follow-up with her therapist, has upcoming appointment. ? ?Denies any other concerns today. ? ? ? ?Visit Diagnosis: R/O PMDD ?  ICD-10-CM   ?1. GAD (generalized anxiety disorder)  F41.1   ?  ?2. MDD (major depressive disorder), recurrent, in full remission (HCC)  F33.42   ?  ?3. PTSD (post-traumatic stress disorder)  F43.10   ?  ?4. Anorexia nervosa  F50.00   ?  ?5. Primary insomnia  F51.01 escitalopram (LEXAPRO) 10 MG tablet  ?  ? ? ?Past Psychiatric History: Reviewed past psychiatric history from progress note on 09/26/2020.  Past trials of Seroquel, Wellbutrin, BuSpar, Prozac, Paxil, Xanax, Effexor, Lexapro, Zoloft, Cymbalta. ? ?Past Medical History:  ?Past Medical History:  ?Diagnosis Date  ? Acid reflux   ? Anemia   ? Anxiety   ? Asthma   ? Chronic constipation   ? Hirschsprung disease   ? Pelvic floor dysfunction   ? Syncope, cardiogenic   ?  ?Past Surgical History:  ?Procedure Laterality Date  ? abdomial adhesions removed  2012  ? APPENDECTOMY  1997  ? bladder repair surgery  1997  ? c- sections  2013,2014  ? enodmetiosis removed  2012  ? OVARIAN CYST REMOVAL  2003, 2012  ? TONSILLECTOMY  2007  ? ? ?Family Psychiatric History: Reviewed family psychiatric history from progress note on 09/26/2020. ? ?Family History:  ?Family History  ?Problem Relation Age of Onset  ? Cancer Mother   ?  Diabetes Mother   ? Hypertension Mother   ? Cancer Father   ? Alcohol abuse Father   ? Depression Father   ? Cancer Maternal Grandfather   ? Factor IX deficiency Maternal Grandfather   ? Factor IX deficiency Son   ? ADD / ADHD Son   ? Autism Son   ? Kidney cancer Neg Hx   ? Bladder Cancer Neg Hx   ? ? ?Social History: Reviewed social history from progress note on 09/26/2020. ?Social History  ? ?Socioeconomic History  ? Marital status: Single  ?   Spouse name: Not on file  ? Number of children: 2  ? Years of education: 12 th grade, some college  ? Highest education level: Not on file  ?Occupational History  ? Occupation: disabled  ?Tobacco Use  ? Smoking status: Never  ? Smokeless tobacco: Never  ?Substance and Sexual Activity  ? Alcohol use: No  ? Drug use: No  ? Sexual activity: Yes  ?  Partners: Male  ?  Birth control/protection: Condom, Pill  ?Other Topics Concern  ? Not on file  ?Social History Narrative  ? Not on file  ? ?Social Determinants of Health  ? ?Financial Resource Strain: Not on file  ?Food Insecurity: Not on file  ?Transportation Needs: Not on file  ?Physical Activity: Not on file  ?Stress: Not on file  ?Social Connections: Not on file  ? ? ?Allergies:  ?Allergies  ?Allergen Reactions  ? Meperidine Anaphylaxis  ?  Other reaction(s): Other (See Comments) ?Other Reaction: Not Assessed  ? Lactose   ?  Other reaction(s): Other (See Comments) ?Other Reaction: GI Upset  ? Fludrocortisone Hives  ? Levofloxacin Hives  ? Sulfamethoxazole-Trimethoprim Other (See Comments)  ?  Other reaction(s): Dizziness  ? ? ?Metabolic Disorder Labs: ?No results found for: HGBA1C, MPG ?No results found for: PROLACTIN ?No results found for: CHOL, TRIG, HDL, CHOLHDL, VLDL, LDLCALC ?Lab Results  ?Component Value Date  ? TSH 1.276 10/30/2020  ? ? ?Therapeutic Level Labs: ?No results found for: LITHIUM ?No results found for: VALPROATE ?No components found for:  CBMZ ? ?Current Medications: ?Current Outpatient Medications  ?Medication Sig Dispense Refill  ? Acidophilus Lactobacillus CAPS Take 1 tablet by mouth daily.    ? albuterol (VENTOLIN HFA) 108 (90 Base) MCG/ACT inhaler Inhale 1 puff into the lungs every 6 (six) hours as needed for wheezing or shortness of breath.     ? busPIRone (BUSPAR) 15 MG tablet Take 1 tablet (15 mg total) by mouth 2 (two) times daily. 60 tablet 1  ? drospirenone-ethinyl estradiol (YASMIN) 3-0.03 MG tablet Take 1 tablet by mouth daily.    ?  EPINEPHrine (EPIPEN 2-PAK) 0.3 mg/0.3 mL IJ SOAJ injection Inject 0.3 mg into the muscle as needed. Reported on 02/07/2016    ? escitalopram (LEXAPRO) 10 MG tablet Take 1.5 tablets (15 mg total) by mouth daily. 45 tablet 1  ? fluticasone (FLONASE) 50 MCG/ACT nasal spray SHAKE LQ AND U 2 SPRAYS IEN QD    ? hydrOXYzine (ATARAX/VISTARIL) 25 MG tablet Take 1 tablet (25 mg total) by mouth 2 (two) times daily as needed. Severe anxiety attacks only 60 tablet 1  ? metroNIDAZOLE (FLAGYL) 500 MG tablet Take by mouth.    ? midodrine (PROAMATINE) 10 MG tablet Take 10 mg by mouth daily.     ? norethindrone-ethinyl estradiol (LOESTRIN) 1-20 MG-MCG tablet Take 1 tablet by mouth daily.    ? omeprazole (PRILOSEC) 40 MG capsule   0  ?  ondansetron (ZOFRAN) 4 MG tablet Take 4 mg by mouth every 8 (eight) hours as needed for nausea.    ? ondansetron (ZOFRAN-ODT) 4 MG disintegrating tablet Take by mouth.    ? Plecanatide 3 MG TABS Take 3 mg by mouth daily.  (Patient not taking: Reported on 09/26/2020)    ? promethazine (PHENERGAN) 25 MG tablet Take 25 mg by mouth every 8 (eight) hours as needed for vomiting.    ? promethazine (PHENERGAN) 25 MG tablet Take by mouth.    ? Sodium Phosphates (RA ENEMA) 7-19 GM/118ML ENEM Place rectally as needed (constipation).    ? zolpidem (AMBIEN) 5 MG tablet Take 1 tablet (5 mg total) by mouth at bedtime as needed for sleep. 30 tablet 1  ? ?No current facility-administered medications for this visit.  ? ?Facility-Administered Medications Ordered in Other Visits  ?Medication Dose Route Frequency Provider Last Rate Last Admin  ? 0.9 %  sodium chloride infusion   Intravenous Continuous Creig Hines, MD   Stopped at 05/09/21 1055  ? 0.9 %  sodium chloride infusion   Intravenous Continuous Mauro Kaufmann, NP   Stopped at 05/11/21 1051  ? 0.9 %  sodium chloride infusion   Intravenous Continuous Creig Hines, MD 10 mL/hr at 05/16/21 1015 New Bag at 05/16/21 1015  ? ? ? ?Musculoskeletal: ?Strength & Muscle  Tone:  UTA ?Gait & Station:  Seated ?Patient leans: N/A ? ?Psychiatric Specialty Exam: ?Review of Systems  ?Psychiatric/Behavioral:  The patient is nervous/anxious.   ?     Mood lability  ?All other systems revie

## 2021-12-03 NOTE — Progress Notes (Signed)
Virtual Visit via Video Note ? ?I connected with Latasha [name] on 12/03/21 at 11:00 AM EDT by a video enabled telemedicine application and verified that I am speaking with the correct person using two identifiers. ? ?Location: ?Patient: home ?Provider: remote office Latasha [name], Latasha [name]) ?  ?I discussed the limitations of evaluation and management by telemedicine and the availability of in person appointments. The patient expressed understanding and agreed to proceed. ?  ?I discussed the assessment and treatment plan with the patient. The patient was provided an opportunity to ask questions and all were answered. The patient agreed with the plan and demonstrated an understanding of the instructions. ?  ?The patient was advised to call back or seek an in-person evaluation if the symptoms worsen or if the condition fails to improve as anticipated. ? ?I provided 45 minutes of non-face-to-face time during this encounter. ? ? ?Kristyna Bradstreet R Destyni Hoppel, LCSW ? ? ?THERAPIST PROGRESS NOTE ? ?Session Time: [id number] ? ?Participation Level: Active ? ?Behavioral Response: Neat and Well GroomedAlertAnxious ? ?Type of Therapy: Individual Therapy ? ?Treatment Goals addressed:Problem: Decrease depressive symptoms and improve levels of effective functioning ?Goal: LTG: Reduce frequency, intensity, and duration of depression symptoms as evidenced by: SSB input needed on appropriate metric ?Outcome: Progressing ?Goal: STG: Latasha [name] WILL PARTICIPATE IN AT LEAST 80% OF SCHEDULED INDIVIDUAL PSYCHOTHERAPY SESSIONS ?Outcome: Progressing ?  ?Problem: Reduce the negative impact trauma related symptoms have on social, occupational, and family functioning. ?Goal: LTG: Reduce frequency, intensity, and duration of PTSD symptoms so daily functioning is improved: Input needed on appropriate metric.  pt self report ?Outcome: Progressing ?Goal: STG: Latasha [name] WILL EXPERIENCE A 80% REDUCTION IN EXAGGERATED BELIEFS ABOUT SELF AND OTHERS THAT INTERFERE WITH  TRAUMA RESOLUTION AS EVIDENCED BY SELF-REPORT ?Outcome: Progressing ? ?ProgressTowards Goals: Progressing ? ?Interventions: CBT and Solution Focused ? ?Summary: Latasha [name] is a 36 y.o. female who presents with continuing symptoms related to PTSD and depression. Pt reports that she is managing generalized anxiety and health-related anxiety better since last session.  ? ?Allowed pt to explore and express thoughts and feelings associated with recent life situations and external stressors. Pt reports that recently there was some conflict between her and her children's father about her letting the children stay home from school because she heard that norovirus was going around. Pt also expressed feelings about recently reaching out to her bio father and feeling rejected after he did not offer for pt and her children to come and visit when she goes up to [place]. "People don't change".  Discussed his battle with alcoholism and overall psychological impact.  ? ?Overall pt admits that she has a lot of health-related anxiety regarding herself and her children--pt feels that she is managing her anxiety and stress well. Pt doesn't feel her anxiety is keeping her or her children from experiencing "fun things".  ? ?Continued recommendations are as follows: self care behaviors, positive social engagements, focusing on overall work/home/life balance, and focusing on positive physical and emotional wellness.  ? ?Suicidal/Homicidal: No ? ?Therapist Response: Pt is continuing to apply interventions learned in session into daily life situations. Pt is currently on track to meet goals utilizing interventions mentioned above. Personal growth and progress fluctuating/intermittent at time of session. Treatment to continue as indicated.  ? ?Plan: Return again in 4 weeks. ? ?Diagnosis: PTSD (post-traumatic stress disorder) ? ?GAD (generalized anxiety disorder) ? ?Collaboration of Care: Other Pt encouraged to continue medication  management services with psychiatrist of record, Dr. Shea Evans ? ?Patient/Guardian was  advised Release of Information must be obtained prior to any record release in order to collaborate their care with an outside provider. Patient/Guardian was advised if they have not already done so to contact the registration department to sign all necessary forms in order for [place] to release information regarding their care.  ? ?Consent: Patient/Guardian gives verbal consent for treatment and assignment of benefits for services provided during this visit. Patient/Guardian expressed understanding and agreed to proceed.  ? ?Latasha Graeff R Haile Bosler, LCSW ?12/03/2021 ?

## 2021-12-03 NOTE — Plan of Care (Signed)
?  Problem: Decrease depressive symptoms and improve levels of effective functioning ?Goal: LTG: Reduce frequency, intensity, and duration of depression symptoms as evidenced by: SSB input needed on appropriate metric ?Outcome: Progressing ?Goal: STG: Chela WILL PARTICIPATE IN AT LEAST 80% OF SCHEDULED INDIVIDUAL PSYCHOTHERAPY SESSIONS ?Outcome: Progressing ?  ?Problem: Reduce the negative impact trauma related symptoms have on social, occupational, and family functioning. ?Goal: LTG: Reduce frequency, intensity, and duration of PTSD symptoms so daily functioning is improved: Input needed on appropriate metric.  pt self report ?Outcome: Progressing ?Goal: STG: Lissandra WILL EXPERIENCE A 80% REDUCTION IN EXAGGERATED BELIEFS ABOUT SELF AND OTHERS THAT INTERFERE WITH TRAUMA RESOLUTION AS EVIDENCED BY SELF-REPORT ?Outcome: Progressing ?  ?

## 2022-01-01 ENCOUNTER — Inpatient Hospital Stay: Payer: Medicare Other

## 2022-01-01 ENCOUNTER — Inpatient Hospital Stay: Payer: Medicare Other | Admitting: Internal Medicine

## 2022-01-01 ENCOUNTER — Telehealth: Payer: Self-pay | Admitting: Internal Medicine

## 2022-01-01 NOTE — Telephone Encounter (Signed)
patient on the way to ER will call to reschedule. BC ?

## 2022-01-02 ENCOUNTER — Encounter: Payer: Self-pay | Admitting: Psychiatry

## 2022-01-02 ENCOUNTER — Telehealth (INDEPENDENT_AMBULATORY_CARE_PROVIDER_SITE_OTHER): Payer: Medicare Other | Admitting: Psychiatry

## 2022-01-02 DIAGNOSIS — F411 Generalized anxiety disorder: Secondary | ICD-10-CM | POA: Diagnosis not present

## 2022-01-02 DIAGNOSIS — F3342 Major depressive disorder, recurrent, in full remission: Secondary | ICD-10-CM | POA: Diagnosis not present

## 2022-01-02 DIAGNOSIS — F5101 Primary insomnia: Secondary | ICD-10-CM

## 2022-01-02 DIAGNOSIS — F5000 Anorexia nervosa, unspecified: Secondary | ICD-10-CM | POA: Diagnosis not present

## 2022-01-02 DIAGNOSIS — F4310 Post-traumatic stress disorder, unspecified: Secondary | ICD-10-CM | POA: Diagnosis not present

## 2022-01-02 MED ORDER — ESCITALOPRAM OXALATE 10 MG PO TABS: 15.0000 mg | ORAL_TABLET | Freq: Every day | ORAL | 1 refills | Status: DC

## 2022-01-02 MED ORDER — ZOLPIDEM TARTRATE 5 MG PO TABS: 5.0000 mg | ORAL_TABLET | Freq: Every evening | ORAL | 1 refills | Status: DC | PRN

## 2022-01-02 NOTE — Progress Notes (Signed)
Virtual Visit via Video Note ? ?I connected with Latasha [name] on 01/02/22 at  8:30 AM EDT by a video enabled telemedicine application and verified that I am speaking with the correct person using two identifiers. ? ?Location ?Provider Location : [facility] ?Patient Location : Home ? ?Participants: Patient , Provider ?  ?I discussed the limitations of evaluation and management by telemedicine and the availability of in person appointments. The patient expressed understanding and agreed to proceed. ? ?  ?I discussed the assessment and treatment plan with the patient. The patient was provided an opportunity to ask questions and all were answered. The patient agreed with the plan and demonstrated an understanding of the instructions. ?  ?The patient was advised to call back or seek an in-person evaluation if the symptoms worsen or if the condition fails to improve as anticipated. ? ? ? ?BH MD OP Progress Note ? ?01/02/2022 8:51 AM ?Latasha [name]  ?MRN:  [id number] ? ?Chief Complaint:  ?Chief Complaint  ?Patient presents with  ? Follow-up: 36 year old Caucasian female with history of MDD, PTSD, anorexia nervosa, insomnia was evaluated for medication management.  ? ?HPI: Latasha [name] is a 36 year old Caucasian female, single, on SSD, lives in [place], has a history of PTSD, anorexia nervosa, primary insomnia, Hirschsprung's disease, iron deficiency anemia, chronic constipation, chronic fatigue syndrome was evaluated by telemedicine today. ? ?Patient today reports she had to go to the emergency department yesterday for complications from an IUD.  Patient reports she is currently getting better, IUD has been removed, bleeding is getting better.  Patient however reports being in the emergency department triggered some of her posttraumatic stress disorder symptoms, intrusive memories about her previous health problems as a child and anxiety.  Patient has been working on her coping strategies which has  helped. ? ?Patient reports other than that the Lexapro higher dosage as beneficial. ? ?Patient denies side effects. ? ?Reports sleep is overall good, takes the zolpidem only as needed, limiting use. ? ?Reports appetite is fair. ? ?Denies any suicidality, homicidality or perceptual disturbances. ? ?Patient denies any other concerns today. ? ?Visit Diagnosis:  ?  ICD-10-CM   ?1. GAD (generalized anxiety disorder)  F41.1   ?  ?2. MDD (major depressive disorder), recurrent, in full remission (HCC)  F33.42   ?  ?3. PTSD (post-traumatic stress disorder)  F43.10   ?  ?4. Anorexia nervosa  F50.00   ?  ?5. Primary insomnia  F51.01 zolpidem (AMBIEN) 5 MG tablet  ?  escitalopram (LEXAPRO) 10 MG tablet  ?  ? ? ?Past Psychiatric History: Reviewed past psychiatric history from progress note on 09/26/2020.  Past trials of Seroquel, Wellbutrin, BuSpar, Prozac, Paxil, Xanax, Effexor, Lexapro, Zoloft, Cymbalta. ? ?Past Medical History:  ?Past Medical History:  ?Diagnosis Date  ? Acid reflux   ? Anemia   ? Anxiety   ? Asthma   ? Chronic constipation   ? Hirschsprung disease   ? Pelvic floor dysfunction   ? Syncope, cardiogenic   ?  ?Past Surgical History:  ?Procedure Laterality Date  ? abdomial adhesions removed  2012  ? APPENDECTOMY  1997  ? bladder repair surgery  1997  ? c- sections  2013,2014  ? enodmetiosis removed  2012  ? OVARIAN CYST REMOVAL  2003, 2012  ? TONSILLECTOMY  2007  ? ? ?Family Psychiatric History: Reviewed family psychiatric history from progress note on 09/26/2020. ? ?Family History:  ?Family History  ?Problem Relation Age of Onset  ?  Cancer Mother   ? Diabetes Mother   ? Hypertension Mother   ? Cancer Father   ? Alcohol abuse Father   ? Depression Father   ? Cancer Maternal Grandfather   ? Factor IX deficiency Maternal Grandfather   ? Factor IX deficiency Son   ? ADD / ADHD Son   ? Autism Son   ? Kidney cancer Neg Hx   ? Bladder Cancer Neg Hx   ? ? ?Social History: Reviewed social history from progress note on  09/26/2020. ?Social History  ? ?Socioeconomic History  ? Marital status: Single  ?  Spouse name: Not on file  ? Number of children: 2  ? Years of education: 12 th grade, some college  ? Highest education level: Not on file  ?Occupational History  ? Occupation: disabled  ?Tobacco Use  ? Smoking status: Never  ? Smokeless tobacco: Never  ?Substance and Sexual Activity  ? Alcohol use: No  ? Drug use: No  ? Sexual activity: Yes  ?  Partners: Male  ?  Birth control/protection: Condom, Pill  ?Other Topics Concern  ? Not on file  ?Social History Narrative  ? Not on file  ? ?Social Determinants of Health  ? ?Financial Resource Strain: Not on file  ?Food Insecurity: Not on file  ?Transportation Needs: Not on file  ?Physical Activity: Not on file  ?Stress: Not on file  ?Social Connections: Not on file  ? ? ?Allergies:  ?Allergies  ?Allergen Reactions  ? Meperidine Anaphylaxis  ?  Other reaction(s): Other (See Comments) ?Other Reaction: Not Assessed  ? Lactose   ?  Other reaction(s): Other (See Comments) ?Other Reaction: GI Upset  ? Fludrocortisone Hives  ? Levofloxacin Hives  ? Sulfamethoxazole-Trimethoprim Other (See Comments)  ?  Other reaction(s): Dizziness  ? ? ?Metabolic Disorder Labs: ?No results found for: HGBA1C, MPG ?No results found for: PROLACTIN ?No results found for: CHOL, TRIG, HDL, CHOLHDL, VLDL, LDLCALC ?Lab Results  ?Component Value Date  ? TSH 1.276 10/30/2020  ? ? ?Therapeutic Level Labs: ?No results found for: LITHIUM ?No results found for: VALPROATE ?No components found for:  CBMZ ? ?Current Medications: ?Current Outpatient Medications  ?Medication Sig Dispense Refill  ? Acidophilus Lactobacillus CAPS Take 1 tablet by mouth daily.    ? albuterol (VENTOLIN HFA) 108 (90 Base) MCG/ACT inhaler Inhale 1 puff into the lungs every 6 (six) hours as needed for wheezing or shortness of breath.     ? busPIRone (BUSPAR) 15 MG tablet Take 1 tablet (15 mg total) by mouth 2 (two) times daily. 60 tablet 1  ? EPINEPHrine  (EPIPEN 2-PAK) 0.3 mg/0.3 mL IJ SOAJ injection Inject 0.3 mg into the muscle as needed. Reported on 02/07/2016    ? fluticasone (FLONASE) 50 MCG/ACT nasal spray SHAKE LQ AND U 2 SPRAYS IEN QD    ? hydrOXYzine (ATARAX/VISTARIL) 25 MG tablet Take 1 tablet (25 mg total) by mouth 2 (two) times daily as needed. Severe anxiety attacks only 60 tablet 1  ? midodrine (PROAMATINE) 10 MG tablet Take 10 mg by mouth daily.     ? norethindrone (AYGESTIN) 5 MG tablet Take by mouth.    ? omeprazole (PRILOSEC) 40 MG capsule   0  ? ondansetron (ZOFRAN) 4 MG tablet Take 4 mg by mouth every 8 (eight) hours as needed for nausea.    ? promethazine (PHENERGAN) 25 MG tablet Take 25 mg by mouth every 8 (eight) hours as needed for vomiting.    ? [START  ON 01/27/2022] escitalopram (LEXAPRO) 10 MG tablet Take 1.5 tablets (15 mg total) by mouth daily. 45 tablet 1  ? norethindrone-ethinyl estradiol (LOESTRIN) 1-20 MG-MCG tablet Take 1 tablet by mouth daily. (Patient not taking: Reported on 01/02/2022)    ? Plecanatide 3 MG TABS Take 3 mg by mouth daily.  (Patient not taking: Reported on 09/26/2020)    ? Sodium Phosphates (RA ENEMA) 7-19 GM/118ML ENEM Place rectally as needed (constipation). (Patient not taking: Reported on 01/02/2022)    ? zolpidem (AMBIEN) 5 MG tablet Take 1 tablet (5 mg total) by mouth at bedtime as needed for sleep. 30 tablet 1  ? ?No current facility-administered medications for this visit.  ? ?Facility-Administered Medications Ordered in Other Visits  ?Medication Dose Route Frequency Provider Last Rate Last Admin  ? 0.9 %  sodium chloride infusion   Intravenous Continuous Creig Hines, MD   Stopped at 05/09/21 1055  ? 0.9 %  sodium chloride infusion   Intravenous Continuous Mauro Kaufmann, NP   Stopped at 05/11/21 1051  ? 0.9 %  sodium chloride infusion   Intravenous Continuous Creig Hines, MD 10 mL/hr at 05/16/21 1015 New Bag at 05/16/21 1015  ? ? ? ?Musculoskeletal: ?Strength & Muscle Tone:  UTA ?Gait & Station:   Seated ?Patient leans: N/A ? ?Psychiatric Specialty Exam: ?Review of Systems  ?Genitourinary:  Positive for vaginal bleeding.  ?Psychiatric/Behavioral:  The patient is nervous/anxious.   ?All other systems reviewed and are negativ

## 2022-01-09 ENCOUNTER — Inpatient Hospital Stay (HOSPITAL_BASED_OUTPATIENT_CLINIC_OR_DEPARTMENT_OTHER): Payer: Medicare Other | Admitting: Internal Medicine

## 2022-01-09 ENCOUNTER — Encounter: Payer: Self-pay | Admitting: Internal Medicine

## 2022-01-09 ENCOUNTER — Inpatient Hospital Stay: Payer: Medicare Other | Attending: Nurse Practitioner

## 2022-01-09 VITALS — BP 102/74 | HR 52

## 2022-01-09 DIAGNOSIS — D509 Iron deficiency anemia, unspecified: Secondary | ICD-10-CM | POA: Diagnosis not present

## 2022-01-09 DIAGNOSIS — Q431 Hirschsprung's disease: Secondary | ICD-10-CM | POA: Insufficient documentation

## 2022-01-09 DIAGNOSIS — D649 Anemia, unspecified: Secondary | ICD-10-CM

## 2022-01-09 DIAGNOSIS — D508 Other iron deficiency anemias: Secondary | ICD-10-CM

## 2022-01-09 MED ORDER — IRON SUCROSE 20 MG/ML IV SOLN
100.0000 mg | Freq: Once | INTRAVENOUS | Status: AC
  Administered 2022-01-09: 100 mg via INTRAVENOUS
  Filled 2022-01-09 (×2): qty 5

## 2022-01-09 MED ORDER — SODIUM CHLORIDE 0.9 % IV SOLN: 100.0000 mg | Freq: Once | INTRAVENOUS | Status: DC

## 2022-01-09 MED ORDER — SODIUM CHLORIDE 0.9 % IV SOLN
Freq: Once | INTRAVENOUS | Status: AC
  Filled 2022-01-09: qty 250

## 2022-01-09 NOTE — Patient Instructions (Signed)

## 2022-01-09 NOTE — Progress Notes (Signed)
[facility] CONSULT NOTE  Patient Care Team: Rayetta Humphrey, MD as PCP - General (Family Medicine) Rayetta Humphrey, MD (Family Medicine) Erie Noe, MD as Referring Physician (Gastroenterology) Earna Coder, MD as Consulting Physician (Oncology)  CHIEF COMPLAINTS/PURPOSE OF CONSULTATION: ANEMIA   HEMATOLOGY HISTORY  # ANEMIA[Hb; MCV-platelets- WBC; Iron sat; ferritin;  GFR- CT/US; EGD/colonoscopy-  Hirshsprung's disease/chronic constipation-    HISTORY OF PRESENTING ILLNESS:  Latasha [name] 36 y.o.  female pleasant patient with a history of spinal disease/constipation with iron deficient anemia/?  Menorrhagia is here for follow-up.  Patient complains of mild to moderate fatigue.  Denies any blood in stools or blood questions.  Chronic fatigue   Review of Systems  Constitutional:  Positive for malaise/fatigue. Negative for chills, diaphoresis, fever and weight loss.  HENT:  Negative for nosebleeds and sore throat.   Eyes:  Negative for double vision.  Respiratory:  Negative for cough, hemoptysis, sputum production, shortness of breath and wheezing.   Cardiovascular:  Negative for chest pain, palpitations, orthopnea and leg swelling.  Gastrointestinal:  Positive for constipation. Negative for abdominal pain, blood in stool, diarrhea, heartburn, melena, nausea and vomiting.  Genitourinary:  Negative for dysuria, frequency and urgency.  Musculoskeletal:  Negative for back pain and joint pain.  Skin: Negative.  Negative for itching and rash.  Neurological:  Negative for dizziness, tingling, focal weakness, weakness and headaches.  Endo/Heme/Allergies:  Does not bruise/bleed easily.  Psychiatric/Behavioral:  Negative for depression. The patient is not nervous/anxious and does not have insomnia.     MEDICAL HISTORY:  Past Medical History:  Diagnosis Date   Acid reflux    Anemia    Anxiety    Asthma    Chronic constipation    Hirschsprung  disease    Pelvic floor dysfunction    Syncope, cardiogenic     SURGICAL HISTORY: Past Surgical History:  Procedure Laterality Date   abdomial adhesions removed  2012   APPENDECTOMY  1997   bladder repair surgery  1997   c- sections  2013,2014   enodmetiosis removed  2012   OVARIAN CYST REMOVAL  2003, 2012   TONSILLECTOMY  2007    SOCIAL HISTORY: Social History   Socioeconomic History   Marital status: Single    Spouse name: Not on file   Number of children: 2   Years of education: 12 th grade, some college   Highest education level: Not on file  Occupational History   Occupation: disabled  Tobacco Use   Smoking status: Never   Smokeless tobacco: Never  Substance and Sexual Activity   Alcohol use: No   Drug use: No   Sexual activity: Yes    Partners: Male    Birth control/protection: Condom, Pill  Other Topics Concern   Not on file  Social History Narrative   Not on file   Social Determinants of Health   Financial Resource Strain: Not on file  Food Insecurity: Not on file  Transportation Needs: Not on file  Physical Activity: Not on file  Stress: Not on file  Social Connections: Not on file  Intimate Partner Violence: Not on file    FAMILY HISTORY: Family History  Problem Relation Age of Onset   Cancer Mother    Diabetes Mother    Hypertension Mother    Cancer Father    Alcohol abuse Father    Depression Father    Cancer Maternal Grandfather    Factor IX deficiency Maternal Grandfather  Factor IX deficiency Son    ADD / ADHD Son    Autism Son    Kidney cancer Neg Hx    Bladder Cancer Neg Hx     ALLERGIES:  is allergic to meperidine, lactose, fludrocortisone, levofloxacin, and sulfamethoxazole-trimethoprim.  MEDICATIONS:  Current Outpatient Medications  Medication Sig Dispense Refill   Acidophilus Lactobacillus CAPS Take 1 tablet by mouth daily.     albuterol (VENTOLIN HFA) 108 (90 Base) MCG/ACT inhaler Inhale 1 puff into the lungs every 6  (six) hours as needed for wheezing or shortness of breath.      busPIRone (BUSPAR) 15 MG tablet Take 1 tablet (15 mg total) by mouth 2 (two) times daily. 60 tablet 1   EPINEPHrine (EPIPEN 2-PAK) 0.3 mg/0.3 mL IJ SOAJ injection Inject 0.3 mg into the muscle as needed. Reported on 02/07/2016     [START ON 01/27/2022] escitalopram (LEXAPRO) 10 MG tablet Take 1.5 tablets (15 mg total) by mouth daily. 45 tablet 1   fluticasone (FLONASE) 50 MCG/ACT nasal spray SHAKE LQ AND U 2 SPRAYS IEN QD     hydrOXYzine (ATARAX/VISTARIL) 25 MG tablet Take 1 tablet (25 mg total) by mouth 2 (two) times daily as needed. Severe anxiety attacks only 60 tablet 1   midodrine (PROAMATINE) 10 MG tablet Take 10 mg by mouth daily.      omeprazole (PRILOSEC) 40 MG capsule   0   ondansetron (ZOFRAN) 4 MG tablet Take 4 mg by mouth every 8 (eight) hours as needed for nausea.     promethazine (PHENERGAN) 25 MG tablet Take 25 mg by mouth every 8 (eight) hours as needed for vomiting.     Sodium Phosphates (RA ENEMA) 7-19 GM/118ML ENEM Place rectally as needed (constipation).     zolpidem (AMBIEN) 5 MG tablet Take 1 tablet (5 mg total) by mouth at bedtime as needed for sleep. 30 tablet 1   norethindrone (AYGESTIN) 5 MG tablet Take by mouth.     No current facility-administered medications for this visit.   Facility-Administered Medications Ordered in Other Visits  Medication Dose Route Frequency Provider Last Rate Last Admin   0.9 %  sodium chloride infusion   Intravenous Continuous Creig Hinesao, Archana C, MD   Stopped at 05/09/21 1055   0.9 %  sodium chloride infusion   Intravenous Continuous Mauro KaufmannBurns, Jennifer E, NP   Stopped at 05/11/21 1051   0.9 %  sodium chloride infusion   Intravenous Continuous Creig Hinesao, Archana C, MD 10 mL/hr at 05/16/21 1015 New Bag at 05/16/21 1015     .  PHYSICAL EXAMINATION:   Vitals:   01/09/22 1417  BP: 110/69  Pulse: 60  Temp: 97.9 F (36.6 C)  SpO2: 100%   Filed Weights   01/09/22 1417  Weight: 136 lb  9.6 oz (62 kg)    Physical Exam Vitals and nursing note reviewed.  HENT:     Head: Normocephalic and atraumatic.     Mouth/Throat:     Pharynx: Oropharynx is clear.  Eyes:     Extraocular Movements: Extraocular movements intact.     Pupils: Pupils are equal, round, and reactive to light.  Cardiovascular:     Rate and Rhythm: Normal rate and regular rhythm.  Pulmonary:     Comments: Decreased breath sounds bilaterally.  Abdominal:     Palpations: Abdomen is soft.  Musculoskeletal:        General: Normal range of motion.     Cervical back: Normal range of motion.  Skin:  General: Skin is warm.  Neurological:     General: No focal deficit present.     Mental Status: She is alert and oriented to person, place, and time.  Psychiatric:        Behavior: Behavior normal.        Judgment: Judgment normal.     LABORATORY DATA:  I have reviewed the data as listed Lab Results  Component Value Date   WBC 6.5 11/22/2021   HGB 13.0 11/22/2021   HCT 39.6 11/22/2021   MCV 95.4 11/22/2021   PLT 209 11/22/2021   No results for input(s): NA, K, CL, CO2, GLUCOSE, BUN, CREATININE, CALCIUM, GFRNONAA, GFRAA, PROT, ALBUMIN, AST, ALT, ALKPHOS, BILITOT, BILIDIR, IBILI in the last 8760 hours.   No results found.  ASSESSMENT & PLAN:   Normocytic anemia #  Iron deficient anemia: Question menorrhagia/poor tolerance of oral iron [constipation/history of disease].  Currently on maintenance IV iron  #April 2023 -ferrritin- 48; I sat- 41%-hemoglobin 12; proceed with Venofer.  I discussed with the patient regarding various forms of dietary iron as it is present universally in meat.  Also discussed regarding plant sources-including but not limited to leafy vegetables [spinach], broccoli.  # Hirschsprung disease: Constipation stable.  # Menorrhagia [[facility]]-defer to GYN.  # DISPOSITION: # venofer today # venofer weekly x 2 more # Follow up in 4 Month NP; Labs- cbc/bmp; irons studies/ ferritin;  venofer- Dr.B    All questions were answered. The patient knows to call the clinic with any problems, questions or concerns.    Earna Coder, MD 01/14/2022 4:03 PM

## 2022-01-09 NOTE — Progress Notes (Signed)
Has been menstruating for 25 days, has been seen in the [facility] ED.

## 2022-01-09 NOTE — Assessment & Plan Note (Addendum)
#    Iron deficient anemia: Question menorrhagia/poor tolerance of oral iron [constipation/history of disease].  Currently on maintenance IV iron  #April 2023 -ferrritin- 48; I sat- 41%-hemoglobin 12; proceed with Venofer.  I discussed with the patient regarding various forms of dietary iron as it is present universally in meat.  Also discussed regarding plant sources-including but not limited to leafy vegetables [spinach], broccoli.  # Hirschsprung disease: Constipation stable.  # Menorrhagia [[facility]]-defer to GYN.  # DISPOSITION: # venofer today # venofer weekly x 2 more # Follow up in 4 Month NP; Labs- cbc/bmp; irons studies/ ferritin; venofer- Dr.B

## 2022-01-14 ENCOUNTER — Encounter: Payer: Self-pay | Admitting: Internal Medicine

## 2022-01-18 ENCOUNTER — Inpatient Hospital Stay: Payer: Medicare Other | Attending: Nurse Practitioner

## 2022-01-18 VITALS — BP 112/63 | HR 54 | Temp 96.9°F | Resp 16

## 2022-01-18 DIAGNOSIS — D509 Iron deficiency anemia, unspecified: Secondary | ICD-10-CM | POA: Insufficient documentation

## 2022-01-18 DIAGNOSIS — Z79899 Other long term (current) drug therapy: Secondary | ICD-10-CM | POA: Insufficient documentation

## 2022-01-18 DIAGNOSIS — D508 Other iron deficiency anemias: Secondary | ICD-10-CM

## 2022-01-18 MED ORDER — IRON SUCROSE 20 MG/ML IV SOLN
100.0000 mg | Freq: Once | INTRAVENOUS | Status: AC
  Administered 2022-01-18: 100 mg via INTRAVENOUS
  Filled 2022-01-18: qty 5

## 2022-01-18 MED ORDER — SODIUM CHLORIDE 0.9 % IV SOLN: 100.0000 mg | Freq: Once | INTRAVENOUS | Status: DC

## 2022-01-18 MED ORDER — SODIUM CHLORIDE 0.9 % IV SOLN
Freq: Once | INTRAVENOUS | Status: AC
  Filled 2022-01-18: qty 250

## 2022-01-18 NOTE — Patient Instructions (Signed)

## 2022-01-22 ENCOUNTER — Ambulatory Visit: Payer: Medicare Other | Admitting: Licensed Clinical Social Worker

## 2022-01-24 ENCOUNTER — Inpatient Hospital Stay: Payer: Medicare Other

## 2022-01-28 ENCOUNTER — Inpatient Hospital Stay: Payer: Medicare Other

## 2022-01-28 VITALS — BP 107/68 | HR 49 | Temp 97.7°F | Resp 20

## 2022-01-28 DIAGNOSIS — D509 Iron deficiency anemia, unspecified: Secondary | ICD-10-CM | POA: Diagnosis not present

## 2022-01-28 DIAGNOSIS — D508 Other iron deficiency anemias: Secondary | ICD-10-CM

## 2022-01-28 MED ORDER — SODIUM CHLORIDE 0.9 % IV SOLN: 100.0000 mg | Freq: Once | INTRAVENOUS | Status: DC

## 2022-01-28 MED ORDER — IRON SUCROSE 20 MG/ML IV SOLN
100.0000 mg | Freq: Once | INTRAVENOUS | Status: AC
  Administered 2022-01-28: 100 mg via INTRAVENOUS
  Filled 2022-01-28: qty 5

## 2022-01-28 MED ORDER — SODIUM CHLORIDE 0.9 % IV SOLN
INTRAVENOUS | Status: DC
  Filled 2022-01-28: qty 250

## 2022-02-20 ENCOUNTER — Ambulatory Visit (INDEPENDENT_AMBULATORY_CARE_PROVIDER_SITE_OTHER): Payer: Medicare Other | Admitting: Licensed Clinical Social Worker

## 2022-02-20 DIAGNOSIS — F4310 Post-traumatic stress disorder, unspecified: Secondary | ICD-10-CM

## 2022-02-20 DIAGNOSIS — F422 Mixed obsessional thoughts and acts: Secondary | ICD-10-CM

## 2022-02-20 NOTE — Progress Notes (Signed)
Virtual Visit via Video Note  I connected with Latasha [name] on 02/20/22 at  8:00 AM EDT by a video enabled telemedicine application and verified that I am speaking with the correct person using two identifiers.  Location: Patient: home Provider: [facility]   I discussed the limitations of evaluation and management by telemedicine and the availability of in person appointments. The patient expressed understanding and agreed to proceed.   I discussed the assessment and treatment plan with the patient. The patient was provided an opportunity to ask questions and all were answered. The patient agreed with the plan and demonstrated an understanding of the instructions.   The patient was advised to call back or seek an in-person evaluation if the symptoms worsen or if the condition fails to improve as anticipated.  I provided 50 minutes of non-face-to-face time during this encounter.   Cyanne Delmar R Nandini Bogdanski, LCSW   THERAPIST PROGRESS NOTE  Session Time: 8-850a  Participation Level: Active  Behavioral Response: Neat and Well GroomedAlertAnxious  Type of Therapy: Individual Therapy  Treatment Goals addressed:  ProgressTowards Goals: Progressing  Interventions: CBT and Solution Focused  Summary: Latasha [name] is a 36 y.o. female who presents with continuing symptoms related to PTSD and depression. Pt reports that she is managing generalized anxiety and health-related anxiety better since last session.   Allowed pt to explore and express thoughts and feelings associated with recent life situations and external stressors.  Discussed pts recent trip to [place] to visit her biological father--pt, pts sons, pt mother, and pt stepfather all drove to [place] so that pt could visit with her father. Pt reports that things were "OK" there but pt states her anxiety levels were higher. Pt states it was good to visit with Bio father's family (pt grew up there)  and to see extended family members and allow her kids to participate in activities that she enjoyed as a child.   Discussed relationships with children--one child has ABA therapist with him 40 hours per week and other child has bleeding disorder. Pt feels overwhelmed with their needs and states that sometimes she just can't face the day and will stay in the bed. When asked frequency of this pt states "one to two times a week". Pt states her parents get mad and "they tell me to get up and be a parent to my kids but its so hard".  Pt states that she is fearful if something happens to her parents, pt is unaware of how she will take care of her kids. Allowed pt to identify internal strengths and discussed using those strengths to build confidence in herself as a fully functioning parent to her children. Pt reflects understanding and will continue working on building confidence in self.   Continued recommendations are as follows: self care behaviors, positive social engagements, focusing on overall work/home/life balance, and focusing on positive physical and emotional wellness.   Suicidal/Homicidal: No  Therapist Response: Pt is continuing to apply interventions learned in session into daily life situations. Pt is currently on track to meet goals utilizing interventions mentioned above. Personal growth and progress fluctuating/intermittent at time of session. Treatment to continue as indicated.   Plan: Return again in 4 weeks.  Diagnosis: PTSD (post-traumatic stress disorder)  Mixed obsessional thoughts and acts  Collaboration of Care: Other Pt encouraged to continue medication management services with psychiatrist of record, Dr. Elna Breslow  Patient/Guardian was advised Release of Information must be obtained prior to any record release in  order to collaborate their care with an outside provider. Patient/Guardian was advised if they have not already done so to contact the registration department to sign  all necessary forms in order for [place] to release information regarding their care.   Consent: Patient/Guardian gives verbal consent for treatment and assignment of benefits for services provided during this visit. Patient/Guardian expressed understanding and agreed to proceed.   [name] Danton Palmateer, LCSW 02/20/2022

## 2022-03-20 ENCOUNTER — Encounter: Payer: Self-pay | Admitting: Psychiatry

## 2022-03-20 ENCOUNTER — Telehealth (INDEPENDENT_AMBULATORY_CARE_PROVIDER_SITE_OTHER): Payer: Medicare Other | Admitting: Psychiatry

## 2022-03-20 DIAGNOSIS — F3342 Major depressive disorder, recurrent, in full remission: Secondary | ICD-10-CM

## 2022-03-20 DIAGNOSIS — F411 Generalized anxiety disorder: Secondary | ICD-10-CM | POA: Diagnosis not present

## 2022-03-20 DIAGNOSIS — F4310 Post-traumatic stress disorder, unspecified: Secondary | ICD-10-CM | POA: Diagnosis not present

## 2022-03-20 DIAGNOSIS — F5101 Primary insomnia: Secondary | ICD-10-CM

## 2022-03-20 DIAGNOSIS — F5000 Anorexia nervosa, unspecified: Secondary | ICD-10-CM

## 2022-03-20 MED ORDER — BUSPIRONE HCL 15 MG PO TABS: 15.0000 mg | ORAL_TABLET | Freq: Two times a day (BID) | ORAL | 2 refills | Status: DC

## 2022-03-20 MED ORDER — ZOLPIDEM TARTRATE 5 MG PO TABS: 5.0000 mg | ORAL_TABLET | Freq: Every evening | ORAL | 2 refills | Status: DC | PRN

## 2022-03-20 MED ORDER — ESCITALOPRAM OXALATE 10 MG PO TABS: 15.0000 mg | ORAL_TABLET | Freq: Every day | ORAL | 2 refills | Status: DC

## 2022-03-20 NOTE — Progress Notes (Unsigned)
Virtual Visit via Video Note  I connected with Latasha [name] on 03/20/22 at  4:40 PM EDT by a video enabled telemedicine application and verified that I am speaking with the correct person using two identifiers.  Location Provider Location : [facility] Patient Location : Home  Participants: Patient , Provider   I discussed the limitations of evaluation and management by telemedicine and the availability of in person appointments. The patient expressed understanding and agreed to proceed.   I discussed the assessment and treatment plan with the patient. The patient was provided an opportunity to ask questions and all were answered. The patient agreed with the plan and demonstrated an understanding of the instructions.   The patient was advised to call back or seek an in-person evaluation if the symptoms worsen or if the condition fails to improve as anticipated.                                                                     [facility] MD OP Progress Note  03/20/2022 5:14 PM IVET SZYMBORSKI  MRN:  [id number]  Chief Complaint:  Chief Complaint  Patient presents with   Follow-up: 36 year old Caucasian female with history of MDD, PTSD, anorexia nervosa, was evaluated for medication management.   HPI: Latasha [name] is a 36 year old Caucasian female, single, on SSD, lives in [place], has a history of PTSD, anorexia nervosa, primary insomnia, Hirschsprung's disease, iron deficiency anemia, chronic constipation, chronic fatigue syndrome was evaluated by telemedicine today.  Patient today reports she is currently stable with regards to her mood.  Denies any significant sadness, lack of motivation or anhedonia.  She does have upcoming hysterectomy coming up in December.  That does worry her although she is coping well.  Denies any suicidality, homicidality or perceptual disturbances.  Patient reports sleep as good.  Reports appetite is fair.  Denies any anorexic episodes.  Denies any binge  eating episodes.  Reports she is compliant on medications.  Denies side effects.  Denies any other concerns today.  Visit Diagnosis:    ICD-10-CM   1. GAD (generalized anxiety disorder)  F41.1 busPIRone (BUSPAR) 15 MG tablet    2. MDD (major depressive disorder), recurrent, in full remission ([place])  F33.42 busPIRone (BUSPAR) 15 MG tablet    3. PTSD (post-traumatic stress disorder)  F43.10     4. Anorexia nervosa  F50.00     5. Primary insomnia  F51.01 zolpidem (AMBIEN) 5 MG tablet    escitalopram (LEXAPRO) 10 MG tablet      Past Psychiatric History: Reviewed past psychiatric history from progress note on 09/26/2020.  Past trials of Seroquel, Wellbutrin, BuSpar, Prozac, Paxil, Xanax, Effexor, Lexapro, Zoloft, Cymbalta.  Past Medical History:  Past Medical History:  Diagnosis Date   Acid reflux    Anemia    Anxiety    Asthma    Chronic constipation    Hirschsprung disease    Pelvic floor dysfunction    Syncope, cardiogenic     Past Surgical History:  Procedure Laterality Date   abdomial adhesions removed  2012   APPENDECTOMY  1997   bladder repair surgery  1997   c- sections  2013,2014   enodmetiosis removed  2012   OVARIAN CYST REMOVAL  2003,  2012   TONSILLECTOMY  2007    Family Psychiatric History: Reviewed family psychiatric history from progress note on 09/26/2020.  Family History:  Family History  Problem Relation Age of Onset   Cancer Mother    Diabetes Mother    Hypertension Mother    Cancer Father    Alcohol abuse Father    Depression Father    Cancer Maternal Grandfather    Factor IX deficiency Maternal Grandfather    Factor IX deficiency Son    ADD / ADHD Son    Autism Son    Kidney cancer Neg Hx    Bladder Cancer Neg Hx     Social History: Reviewed social history from progress note on 09/26/2020. Social History   Socioeconomic History   Marital status: Single    Spouse name: Not on file   Number of children: 2   Years of education: 12 th  grade, some college   Highest education level: Not on file  Occupational History   Occupation: disabled  Tobacco Use   Smoking status: Never   Smokeless tobacco: Never  Substance and Sexual Activity   Alcohol use: No   Drug use: No   Sexual activity: Yes    Partners: Male    Birth control/protection: Condom, Pill  Other Topics Concern   Not on file  Social History Narrative   Not on file   Social Determinants of Health   Financial Resource Strain: Not on file  Food Insecurity: Not on file  Transportation Needs: Not on file  Physical Activity: Not on file  Stress: Not on file  Social Connections: Not on file    Allergies:  Allergies  Allergen Reactions   Meperidine Anaphylaxis    Other reaction(s): Other (See Comments) Other Reaction: Not Assessed   Lactose     Other reaction(s): Other (See Comments) Other Reaction: GI Upset   Fludrocortisone Hives   Levofloxacin Hives   Sulfamethoxazole-Trimethoprim Other (See Comments)    Other reaction(s): Dizziness    Metabolic Disorder Labs: No results found for: "HGBA1C", "MPG" No results found for: "PROLACTIN" No results found for: "CHOL", "TRIG", "HDL", "CHOLHDL", "VLDL", "LDLCALC" Lab Results  Component Value Date   TSH 1.276 10/30/2020    Therapeutic Level Labs: No results found for: "LITHIUM" No results found for: "VALPROATE" No results found for: "CBMZ"  Current Medications: Current Outpatient Medications  Medication Sig Dispense Refill   Acidophilus Lactobacillus CAPS Take 1 tablet by mouth daily.     albuterol (VENTOLIN HFA) 108 (90 Base) MCG/ACT inhaler Inhale 1 puff into the lungs every 6 (six) hours as needed for wheezing or shortness of breath.      EPINEPHrine (EPIPEN 2-PAK) 0.3 mg/0.3 mL IJ SOAJ injection Inject 0.3 mg into the muscle as needed. Reported on 02/07/2016     fluticasone (FLONASE) 50 MCG/ACT nasal spray SHAKE LQ AND U 2 SPRAYS IEN QD     hydrOXYzine (ATARAX/VISTARIL) 25 MG tablet Take 1  tablet (25 mg total) by mouth 2 (two) times daily as needed. Severe anxiety attacks only 60 tablet 1   metroNIDAZOLE (METROGEL) 0.75 % vaginal gel Place vaginally at bedtime.     midodrine (PROAMATINE) 10 MG tablet Take 10 mg by mouth daily.      norgestimate-ethinyl estradiol (SPRINTEC 28) 0.25-35 MG-MCG tablet TAKE 1 TABLET BY MOUTH THREE TIMES DAILY FOR 3 DAYS THEN TAKE 1 TABLET BY MOUTH TWICE DAILY FOR 3 DAYS THEN TAKE 1 TABLET BY MOUTH DAILY     ondansetron (  ZOFRAN) 4 MG tablet Take 4 mg by mouth every 8 (eight) hours as needed for nausea.     promethazine (PHENERGAN) 25 MG tablet Take 25 mg by mouth every 8 (eight) hours as needed for vomiting.     busPIRone (BUSPAR) 15 MG tablet Take 1 tablet (15 mg total) by mouth 2 (two) times daily. 60 tablet 2   escitalopram (LEXAPRO) 10 MG tablet Take 1.5 tablets (15 mg total) by mouth daily. 45 tablet 2   ondansetron (ZOFRAN-ODT) 4 MG disintegrating tablet DISSOLVE 1 TABLET(4 MG) ON THE TONGUE TWICE DAILY (Patient not taking: Reported on 03/20/2022)     Sodium Phosphates (RA ENEMA) 7-19 GM/118ML ENEM Place rectally as needed (constipation). (Patient not taking: Reported on 03/20/2022)     [START ON 04/05/2022] zolpidem (AMBIEN) 5 MG tablet Take 1 tablet (5 mg total) by mouth at bedtime as needed for sleep. 30 tablet 2   No current facility-administered medications for this visit.   Facility-Administered Medications Ordered in Other Visits  Medication Dose Route Frequency Provider Last Rate Last Admin   0.9 %  sodium chloride infusion   Intravenous Continuous Sindy Guadeloupe, MD   Stopped at 05/09/21 1055   0.9 %  sodium chloride infusion   Intravenous Continuous Jacquelin Hawking, NP   Stopped at 05/11/21 1051   0.9 %  sodium chloride infusion   Intravenous Continuous Sindy Guadeloupe, MD 10 mL/hr at 05/16/21 1015 New Bag at 05/16/21 1015     Musculoskeletal: Strength & Muscle Tone:  UTA Gait & Station:  Seated Patient leans: N/A  Psychiatric  Specialty Exam: Review of Systems  Genitourinary:  Positive for vaginal bleeding (Chronic).  Psychiatric/Behavioral:  The patient is nervous/anxious (Coping well).   All other systems reviewed and are negative.   There were no vitals taken for this visit.There is no height or weight on file to calculate BMI.  General Appearance: Casual  Eye Contact:  Fair  Speech:  Normal Rate  Volume:  Normal  Mood:  Anxious coping well  Affect:  Full Range  Thought Process:  Goal Directed and Descriptions of Associations: Intact  Orientation:  Full (Time, Place, and Person)  Thought Content: Logical   Suicidal Thoughts:  No  Homicidal Thoughts:  No  Memory:  Immediate;   Fair Recent;   Fair Remote;   Fair  Judgement:  Fair  Insight:  Fair  Psychomotor Activity:  Normal  Concentration:  Concentration: Fair and Attention Span: Fair  Recall:  [employer] of Knowledge: Fair  Language: Fair  Akathisia:  No  Handed:  Right  AIMS (if indicated): not done  Assets:  Communication Skills Desire for Improvement Housing Talents/Skills Transportation Vocational/Educational  ADL's:  Intact  Cognition: WNL  Sleep:  Fair   Screenings: AIMS    Flowsheet Row Video Visit from 01/02/2022 in [facility] Total Score 0      GAD-7    Flowsheet Row Video Visit from 03/20/2022 in [facility] Video Visit from 01/02/2022 in [facility] Video Visit from 11/30/2021 in [facility] Video Visit from 10/22/2021 in [facility] Video Visit from 10/05/2021 in [facility]  Total GAD-7 Score 1 4 8 10 14       PHQ2-9    Flowsheet Row Video Visit from 03/20/2022 in [facility] Video Visit from 01/02/2022 in [facility] Video Visit from 11/30/2021 in [facility]  Video Visit from 10/22/2021 in [facility] Counselor from 10/16/2021 in [facility]  PHQ-2 Total Score 0 1 2 0 0  PHQ-9 Total Score -- -- 4 -- --      Flowsheet Row Video Visit from 03/20/2022 in [facility] Video Visit from 01/02/2022 in [facility] Video Visit from 11/30/2021 in [facility]  C-SSRS RISK CATEGORY No Risk No Risk No Risk        Assessment and Plan: ODIE RAUEN is a 36 year old Caucasian female on disability, single, lives at [facility], has a history of MDD, GAD, PTSD was evaluated by telemedicine today.  Patient is currently stable.  Plan  GAD-stable Lexapro 15 mg p.o. daily Hydroxyzine 25 mg p.o. twice daily as needed for severe anxiety attacks Continue CBT with Ms. Christina Hussami  PTSD-stable BuSpar 15 mg p.o. twice daily-reduced dosage Lexapro 15 mg p.o. daily Continue CBT  MDD in remission Continue Lexapro and BuSpar as prescribed Continue CBT  Anorexia nervosa-stable Continue CBT  Primary insomnia-improving Zolpidem 5 mg p.o. nightly Reviewed [place] PMP aware  Follow-up in clinic in 3 months or sooner if needed.  Collaboration of Care: Collaboration of Care: Referral or follow-up with counselor/therapist AEB encouraged to continue to follow up with therapist.  Patient/Guardian was advised Release of Information must be obtained prior to any record release in order to collaborate their care with an outside provider. Patient/Guardian was advised if they have not already done so to contact the registration department to sign all necessary forms in order for [place] to release information regarding their care.   Consent: Patient/Guardian gives verbal consent for treatment and assignment of benefits for services provided during this visit. Patient/Guardian expressed understanding and agreed to proceed.   This note was  generated in part or whole with voice recognition software. Voice recognition is usually quite accurate but there are transcription errors that can and very often do occur. I apologize for any typographical errors that were not detected and corrected.      Jomarie Longs, MD 03/21/2022, 8:15 AM

## 2022-04-24 ENCOUNTER — Ambulatory Visit (INDEPENDENT_AMBULATORY_CARE_PROVIDER_SITE_OTHER): Payer: Medicare Other | Admitting: Licensed Clinical Social Worker

## 2022-04-24 DIAGNOSIS — F411 Generalized anxiety disorder: Secondary | ICD-10-CM

## 2022-04-24 DIAGNOSIS — F422 Mixed obsessional thoughts and acts: Secondary | ICD-10-CM | POA: Diagnosis not present

## 2022-04-24 DIAGNOSIS — F4310 Post-traumatic stress disorder, unspecified: Secondary | ICD-10-CM

## 2022-04-24 NOTE — Plan of Care (Signed)
  Problem: Decrease depressive symptoms and improve levels of effective functioning Goal: LTG: Reduce frequency, intensity, and duration of depression symptoms as evidenced by: SSB input needed on appropriate metric Outcome: Not Progressing Goal: STG: Zaylia WILL PARTICIPATE IN AT LEAST 80% OF SCHEDULED INDIVIDUAL PSYCHOTHERAPY SESSIONS Outcome: Progressing Intervention: Encourage verbalization of feelings/concerns/expectations Note: Allowed  Intervention: Assist with coping skills and behavior Note: Reviewed  Intervention: Encourage self-care activities Note: Reviewed  Intervention: Encourage family support Note: Continued to encourage   Problem: Reduce the negative impact trauma related symptoms have on social, occupational, and family functioning. Goal: LTG: Reduce frequency, intensity, and duration of PTSD symptoms so daily functioning is improved: Input needed on appropriate metric.  pt self report Outcome: Not Progressing Goal: STG: Latasha [name] WILL EXPERIENCE A 80% REDUCTION IN EXAGGERATED BELIEFS ABOUT SELF AND OTHERS THAT INTERFERE WITH TRAUMA RESOLUTION AS EVIDENCED BY SELF-REPORT Outcome: Progressing Intervention: Encourage patient to identify triggers Note: Assisted with identification  Intervention: Assist with relaxation techniques, as appropriate (deep breathing exercises, meditation, guided imagery) Note: Reviewed skills

## 2022-04-24 NOTE — Progress Notes (Signed)
Virtual Visit via Audio [name]  I connected with Latasha [name] on 04/24/22 at  9:00 AM EDT by an audio enabled telemedicine application and verified that I am speaking with the correct person using two identifiers.  Video connection was lost when less than 50% of the duration of the visit was complete, at which time the remainder of the visit was completed via audio only.  Pt provided verbal consent for student, Latasha [name], to be present throughout session.   Location: Patient: home Provider: [facility]   I discussed the limitations of evaluation and management by telemedicine and the availability of in person appointments. The patient expressed understanding and agreed to proceed.   I discussed the assessment and treatment plan with the patient. The patient was provided an opportunity to ask questions and all were answered. The patient agreed with the plan and demonstrated an understanding of the instructions.   The patient was advised to call back or seek an in-person evaluation if the symptoms worsen or if the condition fails to improve as anticipated.  I provided 45 minutes of non-face-to-face time during this encounter.   Latasha [name] R Latasha [name], Latasha [name]   Latasha [name]  Session Time: [id number]  Participation Level: Active  Behavioral Response: Neat and Well GroomedAlertAnxious  Type of Therapy: Individual Therapy  Treatment Goals addressed:Problem: Decrease depressive symptoms and improve levels of effective functioning Goal: LTG: Reduce frequency, intensity, and duration of depression symptoms as evidenced by: SSB input needed on appropriate metric Outcome: Not Progressing Goal: STG: Latasha [name] WILL PARTICIPATE IN AT LEAST 80% OF SCHEDULED INDIVIDUAL PSYCHOTHERAPY SESSIONS Outcome: Progressing Intervention: Encourage verbalization of feelings/concerns/expectations [name]: Allowed  Intervention: Assist with coping skills and  behavior [name]: Reviewed  Intervention: Encourage self-care activities [name]: Reviewed  Intervention: Encourage family support [name]: Continued to encourage   Problem: Reduce the negative impact trauma related symptoms have on social, occupational, and family functioning. Goal: LTG: Reduce frequency, intensity, and duration of PTSD symptoms so daily functioning is improved: Input needed on appropriate metric.  pt self report Outcome: Not Progressing Goal: STG: Latasha [name] WILL EXPERIENCE A 80% REDUCTION IN EXAGGERATED BELIEFS ABOUT SELF AND OTHERS THAT INTERFERE WITH TRAUMA RESOLUTION AS EVIDENCED BY SELF-REPORT Outcome: Progressing Intervention: Encourage patient to identify triggers [name]: Assisted with identification  Intervention: Assist with relaxation techniques, as appropriate (deep breathing exercises, meditation, guided imagery) [name]: Reviewed skills   ProgressTowards Goals: Not Progressing/fluctuating due to acute external stressors (recovering from surgery)  Interventions: CBT, Motivational Interviewing, and Supportive  Summary: Latasha [name] is a 36 y.o. female who presents with continuing symptoms related to PTSD and depression.   Allowed pt to explore and express thoughts and feelings associated with recent life situations and external stressors. Pt reports that she had a surgical procedure done (hysterectomy) and pt is recovering from this procedure. Pt has been more preoccupied with her health, recovery, and things that could go wrong. Pt admits that she has spent a lot of time "googling" symptoms, side effects, and recovery issues.   Pt reports that this procedure was a very triggering situation since pts PTSD trigger is hospitals/hospitalization. Allowed pt safe space to explore triggers, thoughts, and feelings. Discussed management of thoughts/feelings and utilized some positive imagery/thoughts. Allowed pt to identify anxiety coping skills that are helpful when anxiety levels  are high.   Pt states that her children have been spending time with their father more since she has been recovering. Pt reports that she finds this helpful, as her  parents have been their primary caregivers since recent surgery  Continued recommendations are as follows: self care behaviors, positive social engagements, focusing on overall work/home/life balance, and focusing on positive physical and emotional wellness.   Suicidal/Homicidal: No  Latasha Response: Pt is continuing to apply interventions learned in session into daily life situations. Pt is currently on track to meet goals utilizing interventions mentioned above. Personal growth and progress fluctuating/intermittent at time of session. Treatment to continue as indicated.   Plan: Return again in 4 weeks.  Diagnosis:  Encounter Diagnoses  Name Primary?   PTSD (post-traumatic stress disorder) Yes   Mixed obsessional thoughts and acts    GAD (generalized anxiety disorder)    Collaboration of Care: Other Pt encouraged to continue medication management services with psychiatrist of record, Dr. Elna [name]  Patient/Guardian was advised Release of Information must be obtained prior to any record release in order to collaborate their care with an outside provider. Patient/Guardian was advised if they have not already done so to contact the registration department to sign all necessary forms in order for [place] to release information regarding their care.   Consent: Patient/Guardian gives verbal consent for treatment and assignment of benefits for services provided during this visit. Patient/Guardian expressed understanding and agreed to proceed.   Latasha [name] Latasha [name], Latasha [name] 04/24/2022

## 2022-05-13 ENCOUNTER — Inpatient Hospital Stay: Payer: Medicare Other

## 2022-05-13 ENCOUNTER — Inpatient Hospital Stay: Payer: Medicare Other | Admitting: Nurse Practitioner

## 2022-05-16 MED FILL — Iron Sucrose Inj 20 MG/ML (Fe Equiv): INTRAVENOUS | Qty: 5 | Status: AC

## 2022-05-17 ENCOUNTER — Encounter: Payer: Self-pay | Admitting: Nurse Practitioner

## 2022-05-17 ENCOUNTER — Inpatient Hospital Stay: Payer: Medicare Other

## 2022-05-17 ENCOUNTER — Inpatient Hospital Stay: Payer: Medicare Other | Attending: Nurse Practitioner | Admitting: Nurse Practitioner

## 2022-05-17 VITALS — BP 103/70 | HR 65 | Temp 98.3°F | Resp 16 | Ht 65.25 in | Wt 133.0 lb

## 2022-05-17 DIAGNOSIS — D509 Iron deficiency anemia, unspecified: Secondary | ICD-10-CM | POA: Insufficient documentation

## 2022-05-17 DIAGNOSIS — Z90710 Acquired absence of both cervix and uterus: Secondary | ICD-10-CM | POA: Insufficient documentation

## 2022-05-17 DIAGNOSIS — Q431 Hirschsprung's disease: Secondary | ICD-10-CM | POA: Diagnosis not present

## 2022-05-17 DIAGNOSIS — D649 Anemia, unspecified: Secondary | ICD-10-CM

## 2022-05-17 DIAGNOSIS — N920 Excessive and frequent menstruation with regular cycle: Secondary | ICD-10-CM | POA: Insufficient documentation

## 2022-05-17 LAB — CBC WITH DIFFERENTIAL/PLATELET
Abs Immature Granulocytes: 0.02 10*3/uL (ref 0.00–0.07)
Basophils Absolute: 0 10*3/uL (ref 0.0–0.1)
Basophils Relative: 1 %
Eosinophils Absolute: 0.2 10*3/uL (ref 0.0–0.5)
Eosinophils Relative: 2 %
HCT: 35.8 % — ABNORMAL LOW (ref 36.0–46.0)
Hemoglobin: 12.4 g/dL (ref 12.0–15.0)
Immature Granulocytes: 0 %
Lymphocytes Relative: 30 %
Lymphs Abs: 2.2 10*3/uL (ref 0.7–4.0)
MCH: 32 pg (ref 26.0–34.0)
MCHC: 34.6 g/dL (ref 30.0–36.0)
MCV: 92.5 fL (ref 80.0–100.0)
Monocytes Absolute: 0.5 10*3/uL (ref 0.1–1.0)
Monocytes Relative: 7 %
Neutro Abs: 4.4 10*3/uL (ref 1.7–7.7)
Neutrophils Relative %: 60 %
Platelets: 181 10*3/uL (ref 150–400)
RBC: 3.87 MIL/uL (ref 3.87–5.11)
RDW: 11.5 % (ref 11.5–15.5)
WBC: 7.3 10*3/uL (ref 4.0–10.5)
nRBC: 0 % (ref 0.0–0.2)

## 2022-05-17 LAB — FERRITIN: Ferritin: 63 ng/mL (ref 11–307)

## 2022-05-17 LAB — BASIC METABOLIC PANEL
Anion gap: 1 — ABNORMAL LOW (ref 5–15)
BUN: 15 mg/dL (ref 6–20)
CO2: 30 mmol/L (ref 22–32)
Calcium: 8.7 mg/dL — ABNORMAL LOW (ref 8.9–10.3)
Chloride: 108 mmol/L (ref 98–111)
Creatinine, Ser: 0.81 mg/dL (ref 0.44–1.00)
GFR, Estimated: >60 mL/min (ref >60)
Glucose, Bld: 89 mg/dL (ref 70–99)
Potassium: 4.1 mmol/L (ref 3.5–5.1)
Sodium: 139 mmol/L (ref 135–145)

## 2022-05-17 LAB — IRON AND TIBC
Iron: 91 ug/dL (ref 28–170)
Saturation Ratios: 33 % — ABNORMAL HIGH (ref 10.4–31.8)
TIBC: 279 ug/dL (ref 250–450)
UIBC: 188 ug/dL

## 2022-05-17 NOTE — Progress Notes (Signed)
Per NP note, no venofer today.

## 2022-05-17 NOTE — Progress Notes (Signed)
[facility] CONSULT NOTE  Patient Care Team: Rayetta Humphrey, MD as PCP - General (Family Medicine) Rayetta Humphrey, MD (Family Medicine) Erie Noe, MD as Referring Physician (Gastroenterology) Earna Coder, MD as Consulting Physician (Oncology)  CHIEF COMPLAINTS/PURPOSE OF CONSULTATION: ANEMIA   HEMATOLOGY HISTORY  # ANEMIA[Hb; MCV-platelets- WBC; Iron sat; ferritin;  GFR- CT/US; EGD/colonoscopy-  Hirshsprung's disease/chronic constipation-    HISTORY OF PRESENTING ILLNESS:  Latasha [name] 36 y.o.  female pleasant patient with a history of spinal disease/constipation with iron deficient anemia, possibly related to menorrhagia, returns to clinic for follow up. She reports feeling at baseline and denies complaints. Denies any neurologic complaints. Denies recent fevers or illnesses. Denies any easy bleeding or bruising. No melena or hematochezia. No pica or restless leg. Reports good appetite and denies weight loss. Denies chest pain. Denies any nausea, vomiting, constipation, or diarrhea. Denies urinary complaints. Patient offers no further specific complaints today.    Review of Systems  Constitutional:  Negative for chills, diaphoresis, fever, malaise/fatigue and weight loss.  HENT:  Negative for congestion, ear discharge, ear pain, nosebleeds, sinus pain and sore throat.   Eyes:  Negative for pain and redness.  Respiratory:  Negative for cough, hemoptysis, sputum production, shortness of breath, wheezing and stridor.   Cardiovascular:  Negative for chest pain, palpitations and leg swelling.  Gastrointestinal:  Negative for abdominal pain, blood in stool, constipation, diarrhea, melena, nausea and vomiting.  Musculoskeletal:  Negative for falls, joint pain and myalgias.  Skin:  Negative for rash.  Neurological:  Negative for dizziness, loss of consciousness, weakness and headaches.  Psychiatric/Behavioral:  Negative for depression. The patient is  not nervous/anxious and does not have insomnia.   All other systems reviewed and are negative.  MEDICAL HISTORY:  Past Medical History:  Diagnosis Date   Acid reflux    Anemia    Anxiety    Asthma    Chronic constipation    Hirschsprung disease    Pelvic floor dysfunction    Syncope, cardiogenic     SURGICAL HISTORY: Past Surgical History:  Procedure Laterality Date   abdomial adhesions removed  2012   APPENDECTOMY  1997   bladder repair surgery  1997   c- sections  2013,2014   enodmetiosis removed  2012   OVARIAN CYST REMOVAL  2003, 2012   TONSILLECTOMY  2007    SOCIAL HISTORY: Social History   Socioeconomic History   Marital status: Single    Spouse name: Not on file   Number of children: 2   Years of education: 12 th grade, some college   Highest education level: Not on file  Occupational History   Occupation: disabled  Tobacco Use   Smoking status: Never   Smokeless tobacco: Never  Substance and Sexual Activity   Alcohol use: No   Drug use: No   Sexual activity: Yes    Partners: Male    Birth control/protection: Condom, Pill  Other Topics Concern   Not on file  Social History Narrative   Not on file   Social Determinants of Health   Financial Resource Strain: Not on file  Food Insecurity: Not on file  Transportation Needs: Not on file  Physical Activity: Not on file  Stress: Not on file  Social Connections: Not on file  Intimate Partner Violence: Not on file    FAMILY HISTORY: Family History  Problem Relation Age of Onset   Cancer Mother    Diabetes Mother  Hypertension Mother    Cancer Father    Alcohol abuse Father    Depression Father    Cancer Maternal Grandfather    Factor IX deficiency Maternal Grandfather    Factor IX deficiency Son    ADD / ADHD Son    Autism Son    Kidney cancer Neg Hx    Bladder Cancer Neg Hx     ALLERGIES:  is allergic to meperidine, lactose, fludrocortisone, levofloxacin, and  sulfamethoxazole-trimethoprim.  MEDICATIONS:  Current Outpatient Medications  Medication Sig Dispense Refill   Acidophilus Lactobacillus CAPS Take 1 tablet by mouth daily.     albuterol (VENTOLIN HFA) 108 (90 Base) MCG/ACT inhaler Inhale 1 puff into the lungs every 6 (six) hours as needed for wheezing or shortness of breath.      busPIRone (BUSPAR) 15 MG tablet Take 1 tablet (15 mg total) by mouth 2 (two) times daily. 60 tablet 2   EPINEPHrine (EPIPEN 2-PAK) 0.3 mg/0.3 mL IJ SOAJ injection Inject 0.3 mg into the muscle as needed. Reported on 02/07/2016     escitalopram (LEXAPRO) 10 MG tablet Take 1.5 tablets (15 mg total) by mouth daily. 45 tablet 2   fluticasone (FLONASE) 50 MCG/ACT nasal spray SHAKE LQ AND U 2 SPRAYS IEN QD     hydrOXYzine (ATARAX/VISTARIL) 25 MG tablet Take 1 tablet (25 mg total) by mouth 2 (two) times daily as needed. Severe anxiety attacks only 60 tablet 1   metroNIDAZOLE (METROGEL) 0.75 % vaginal gel Place vaginally at bedtime.     midodrine (PROAMATINE) 10 MG tablet Take 10 mg by mouth daily.      ondansetron (ZOFRAN) 4 MG tablet Take 4 mg by mouth every 8 (eight) hours as needed for nausea.     ondansetron (ZOFRAN-ODT) 4 MG disintegrating tablet      promethazine (PHENERGAN) 25 MG tablet Take 25 mg by mouth every 8 (eight) hours as needed for vomiting.     zolpidem (AMBIEN) 5 MG tablet Take 1 tablet (5 mg total) by mouth at bedtime as needed for sleep. 30 tablet 2   norgestimate-ethinyl estradiol (SPRINTEC 28) 0.25-35 MG-MCG tablet TAKE 1 TABLET BY MOUTH THREE TIMES DAILY FOR 3 DAYS THEN TAKE 1 TABLET BY MOUTH TWICE DAILY FOR 3 DAYS THEN TAKE 1 TABLET BY MOUTH DAILY (Patient not taking: Reported on 05/17/2022)     Sodium Phosphates (RA ENEMA) 7-19 GM/118ML ENEM Place rectally as needed (constipation). (Patient not taking: Reported on 03/20/2022)     No current facility-administered medications for this visit.   Facility-Administered Medications Ordered in Other Visits   Medication Dose Route Frequency Provider Last Rate Last Admin   0.9 %  sodium chloride infusion   Intravenous Continuous Creig Hines, MD   Stopped at 05/09/21 1055   0.9 %  sodium chloride infusion   Intravenous Continuous Mauro Kaufmann, NP   Stopped at 05/11/21 1051   0.9 %  sodium chloride infusion   Intravenous Continuous Creig Hines, MD 10 mL/hr at 05/16/21 1015 New Bag at 05/16/21 1015  .  PHYSICAL EXAMINATION: Vitals:   05/17/22 1405  BP: 103/70  Pulse: 65  Resp: 16  Temp: 98.3 F (36.8 C)  SpO2: 99%   Filed Weights   05/17/22 1405  Weight: 133 lb (60.3 kg)    Physical Exam Constitutional:      Appearance: She is not ill-appearing.  Cardiovascular:     Rate and Rhythm: Normal rate and regular rhythm.  Abdominal:  General: There is no distension.     Palpations: Abdomen is soft.     Tenderness: There is no abdominal tenderness. There is no guarding.  Lymphadenopathy:     Cervical: No cervical adenopathy.  Skin:    General: Skin is warm and dry.     Coloration: Skin is not pale.  Neurological:     Mental Status: She is alert and oriented to person, place, and time.  Psychiatric:        Mood and Affect: Mood normal.        Behavior: Behavior normal.      LABORATORY DATA:  I have reviewed the data as listed Lab Results  Component Value Date   WBC 7.3 05/17/2022   HGB 12.4 05/17/2022   HCT 35.8 (L) 05/17/2022   MCV 92.5 05/17/2022   PLT 181 05/17/2022   Recent Labs    05/17/22 1335  NA 139  K 4.1  CL 108  CO2 30  GLUCOSE 89  BUN 15  CREATININE 0.81  CALCIUM 8.7*  GFRNONAA >60  Iron/TIBC/Ferritin/ %Sat    Component Value Date/Time   IRON 91 05/17/2022 1335   TIBC 279 05/17/2022 1335   FERRITIN 63 05/17/2022 1335   IRONPCTSAT 33 (H) 05/17/2022 1335      No results found.  ASSESSMENT & PLAN:   No problem-specific Assessment & Plan notes found for this encounter.  Normocytic anemia # Iron deficient anemia: Question  secondary to menorrhagia. Unable to tolerate oral iron d/t constipation and GI intolerance. Receives iv venofer. Has tolerated well. Last venofer 100 mg 01/28/22. Hmg 12.4. Ferritin 63, Iron sat 33%. She is asymptomatic. Continue iron rich foods. Hold venofer today.   # Menorrhagia- managed by [facility]. S/p hysterectomy on 04/15/22 for hx of endometriosis and leiomyoma.   # Hirschsprung disease: Constipation. Stable.    DISPOSITION: No iron today 4 mo- lab (cbc, ferritin, iron studies) Day to week later- virtual visit with Dr. Rogue Bussing- [place]  All questions were answered. The patient knows to call the clinic with any problems, questions or concerns.  Thank you for allowing me to participate in the care of this pleasant patient.   Verlon Au, NP 05/17/2022

## 2022-05-27 ENCOUNTER — Encounter: Payer: Self-pay | Admitting: Internal Medicine

## 2022-06-05 ENCOUNTER — Ambulatory Visit (INDEPENDENT_AMBULATORY_CARE_PROVIDER_SITE_OTHER): Payer: Medicare Other | Admitting: Licensed Clinical Social Worker

## 2022-06-05 DIAGNOSIS — F422 Mixed obsessional thoughts and acts: Secondary | ICD-10-CM | POA: Diagnosis not present

## 2022-06-05 DIAGNOSIS — F4310 Post-traumatic stress disorder, unspecified: Secondary | ICD-10-CM | POA: Diagnosis not present

## 2022-06-05 NOTE — Progress Notes (Signed)
Virtual Visit via Audio Note  I connected with Latasha [name] on 06/05/22 at  9:00 AM EDT by an audio enabled telemedicine application and verified that I am speaking with the correct person using two identifiers.  Video connection was lost when less than 50% of the duration of the visit was complete, at which time the remainder of the visit was completed via audio only.  Location: Patient: home Provider: [facility]   I discussed the limitations of evaluation and management by telemedicine and the availability of in person appointments. The patient expressed understanding and agreed to proceed.   I discussed the assessment and treatment plan with the patient. The patient was provided an opportunity to ask questions and all were answered. The patient agreed with the plan and demonstrated an understanding of the instructions.   The patient was advised to call back or seek an in-person evaluation if the symptoms worsen or if the condition fails to improve as anticipated.  I provided 45 minutes of non-face-to-face time during this encounter.   [place], LCSW   THERAPIST PROGRESS NOTE  Session Time: 9-10a  Participation Level: Active  Behavioral Response: Neat and Well GroomedAlertAnxious  Type of Therapy: Individual Therapy  Treatment Goals addressed:Problem: Decrease depressive symptoms and improve levels of effective functioning Goal: LTG: Reduce frequency, intensity, and duration of depression symptoms as evidenced by: SSB input needed on appropriate metric Outcome: Progressing Goal: STG: Latasha [name] WILL PARTICIPATE IN AT LEAST 80% OF SCHEDULED INDIVIDUAL PSYCHOTHERAPY SESSIONS Outcome: Progressing Intervention: WORK WITH Latasha [name] TO IDENTIFY THE MAJOR COMPONENTS OF A RECENT EPISODE OF DEPRESSION: PHYSICAL SYMPTOMS, MAJOR THOUGHTS AND IMAGES, AND MAJOR BEHAVIORS THEY EXPERIENCED Note: Reviewed    Intervention: Encourage  verbalization of feelings/concerns/expectations Note: Allowed/explored    Problem: Reduce the negative impact trauma related symptoms have on social, occupational, and family functioning. Goal: LTG: Reduce frequency, intensity, and duration of PTSD symptoms so daily functioning is improved: Input needed on appropriate metric.  pt self report Outcome: Progressing Goal: STG: Latasha [name] WILL EXPERIENCE A 80% REDUCTION IN EXAGGERATED BELIEFS ABOUT SELF AND OTHERS THAT INTERFERE WITH TRAUMA RESOLUTION AS EVIDENCED BY SELF-REPORT Outcome: Progressing  ProgressTowards Goals: Not Progressing/fluctuating   Interventions: CBT, Motivational Interviewing, and Supportive. Completed YBOCS   Summary: Latasha [name] is a 36 y.o. female who presents with continuing symptoms related to PTSD and depression.   Allowed pt to explore and express thoughts and feelings associated with recent life situations and external stressors. Patient reports one of her primary stressors is her continued symptoms following a hysterectomy that was done seven weeks ago. Patient reports that she had her six week post op appointment, but she missed the appointment. Patient reports that she still does not feel like herself since having the surgery. Patient reports that she can't do anything except walk slowly. Patient reports that she feels that her parents are getting frustrated with her because of her health issues. Patient reports that she has stress associated with her physical limitations, because she is not able to clean her home the way that she wants it clean. Patient also reports that she has stress associated with her inability to exercise with the intensity that she wants to exercise. Patient reports that she feels "high" about recent weight loss, which triggers her thoughts and feelings associated with previous eating disorder diagnosis.  Patient reports that she has continuing anxiety associated with the health of her children.  Patient reports that they are in school, and exposed to  germs, which is a big trigger for her. Patient reports that one primary concern is her son with autism will get very physically aggressive when he runs a fever.  Due to multiple expressions of excessive preoccupation with multiple things, clinician administered Yale-Brown Obsessive Compulsive Scale (YBOCS) and pt scored a 30.   Continued recommendations are as follows: self care behaviors, positive social engagements, focusing on overall work/home/life balance, and focusing on positive physical and emotional wellness.   Suicidal/Homicidal: No  Therapist Response: Pt is continuing to apply interventions learned in session into daily life situations. Pt is currently on track to meet goals utilizing interventions mentioned above. Personal growth and progress fluctuating/intermittent at time of session. Treatment to continue as indicated.   Plan: Return again in 4 weeks.  Diagnosis:  Encounter Diagnoses  Name Primary?   PTSD (post-traumatic stress disorder) Yes   Mixed obsessional thoughts and acts     Collaboration of Care: Other Pt encouraged to continue medication management services with psychiatrist of record, Dr. Shea [name]  Patient/Guardian was advised Release of Information must be obtained prior to any record release in order to collaborate their care with an outside provider. Patient/Guardian was advised if they have not already done so to contact the registration department to sign all necessary forms in order for [place] to release information regarding their care.   Consent: Patient/Guardian gives verbal consent for treatment and assignment of benefits for services provided during this visit. Patient/Guardian expressed understanding and agreed to proceed.   [place], LCSW 06/05/2022

## 2022-06-08 NOTE — Plan of Care (Signed)
  Problem: Decrease depressive symptoms and improve levels of effective functioning Goal: LTG: Reduce frequency, intensity, and duration of depression symptoms as evidenced by: SSB input needed on appropriate metric Outcome: Progressing Goal: STG: Latasha [name] WILL PARTICIPATE IN AT LEAST 80% OF SCHEDULED INDIVIDUAL PSYCHOTHERAPY SESSIONS Outcome: Progressing Intervention: WORK WITH Latasha [name] TO IDENTIFY THE MAJOR COMPONENTS OF A RECENT EPISODE OF DEPRESSION: PHYSICAL SYMPTOMS, MAJOR THOUGHTS AND IMAGES, AND MAJOR BEHAVIORS THEY EXPERIENCED Note: Reviewed   Intervention: Encourage verbalization of feelings/concerns/expectations Note: Allowed/explored    Problem: Reduce the negative impact trauma related symptoms have on social, occupational, and family functioning. Goal: LTG: Reduce frequency, intensity, and duration of PTSD symptoms so daily functioning is improved: Input needed on appropriate metric.  pt self report Outcome: Progressing Goal: STG: Latasha [name] WILL EXPERIENCE A 80% REDUCTION IN EXAGGERATED BELIEFS ABOUT SELF AND OTHERS THAT INTERFERE WITH TRAUMA RESOLUTION AS EVIDENCED BY SELF-REPORT Outcome: Progressing

## 2022-06-19 ENCOUNTER — Other Ambulatory Visit: Payer: Self-pay

## 2022-06-19 ENCOUNTER — Emergency Department: Payer: Medicare Other

## 2022-06-19 ENCOUNTER — Encounter: Payer: Self-pay | Admitting: Emergency Medicine

## 2022-06-19 ENCOUNTER — Emergency Department
Admission: EM | Admit: 2022-06-19 | Discharge: 2022-06-19 | Disposition: A | Discharge status: Home / Self Care | Payer: Medicare Other | Attending: Emergency Medicine | Admitting: Emergency Medicine

## 2022-06-19 DIAGNOSIS — R000 Tachycardia, unspecified: Secondary | ICD-10-CM | POA: Insufficient documentation

## 2022-06-19 DIAGNOSIS — R0789 Other chest pain: Secondary | ICD-10-CM | POA: Diagnosis not present

## 2022-06-19 LAB — BASIC METABOLIC PANEL
Anion gap: 6 (ref 5–15)
BUN: 21 mg/dL — ABNORMAL HIGH (ref 6–20)
CO2: 27 mmol/L (ref 22–32)
Calcium: 9.5 mg/dL (ref 8.9–10.3)
Chloride: 107 mmol/L (ref 98–111)
Creatinine, Ser: 0.79 mg/dL (ref 0.44–1.00)
GFR, Estimated: >60 mL/min (ref >60)
Glucose, Bld: 150 mg/dL — ABNORMAL HIGH (ref 70–99)
Potassium: 3.5 mmol/L (ref 3.5–5.1)
Sodium: 140 mmol/L (ref 135–145)

## 2022-06-19 LAB — CBC
HCT: 40.2 % (ref 36.0–46.0)
Hemoglobin: 13.7 g/dL (ref 12.0–15.0)
MCH: 31.1 pg (ref 26.0–34.0)
MCHC: 34.1 g/dL (ref 30.0–36.0)
MCV: 91.4 fL (ref 80.0–100.0)
Platelets: 210 10*3/uL (ref 150–400)
RBC: 4.4 MIL/uL (ref 3.87–5.11)
RDW: 11.9 % (ref 11.5–15.5)
WBC: 7.8 10*3/uL (ref 4.0–10.5)
nRBC: 0 % (ref 0.0–0.2)

## 2022-06-19 LAB — TROPONIN I (HIGH SENSITIVITY): Troponin I (High Sensitivity): 6 ng/L (ref <18)

## 2022-06-19 MED ORDER — IOHEXOL 350 MG/ML SOLN
75.0000 mL | Freq: Once | INTRAVENOUS | Status: AC | PRN
  Administered 2022-06-19: 75 mL via INTRAVENOUS

## 2022-06-19 NOTE — ED Provider Notes (Signed)
[facility] Provider Note    Event Date/Time   First MD Initiated Contact with Patient 06/19/22 1957     (approximate)   History   Chest Pain   HPI  Latasha [name] is a 36 y.o. female with a history of cardiogenic syncope who presents with complaints of chest discomfort and high heart rate.  Patient reports she had a hysterectomy 9 weeks ago, today was the first day that she was allowed to exert herself again.  She went to play basketball with her kids at around 3 PM and felt a slight discomfort in her chest, she looked at her Apple Watch and noted her heart rate was elevated.  She felt that it took longer to decrease than expected.  She eventually decided to come to the emergency department for evaluation.  She reports chest pain has resolved.  She recently had a 6-hour ride, no history of PE reported     Physical Exam   Triage Vital Signs: ED Triage Vitals  Enc Vitals Group     BP 06/19/22 1945 (!) 126/90     Pulse Rate 06/19/22 1945 (!) 104     Resp 06/19/22 1945 16     Temp 06/19/22 1945 98.5 F (36.9 C)     Temp Source 06/19/22 1945 Oral     SpO2 06/19/22 1945 95 %     Weight 06/19/22 1946 57.6 kg (127 lb)     Height 06/19/22 1946 1.651 m (5\' 5" )     Head Circumference --      Peak Flow --      Pain Score 06/19/22 1950 4     Pain Loc --      Pain Edu? --      Excl. in [place]? --     Most recent vital signs: Vitals:   06/19/22 1945  BP: (!) 126/90  Pulse: (!) 104  Resp: 16  Temp: 98.5 F (36.9 C)  SpO2: 95%     General: Awake, no distress.  CV:  Good peripheral perfusion.  Regular rate and rhythm Resp:  Normal effort.  CTA bilaterally Abd:  No distention.  Other:  No calf pain or swelling   ED Results / Procedures / Treatments   Labs (all labs ordered are listed, but only abnormal results are displayed) Labs Reviewed  BASIC METABOLIC PANEL - Abnormal; Notable for the following components:      Result Value   Glucose, Bld  150 (*)    BUN 21 (*)    All other components within normal limits  CBC  TROPONIN I (HIGH SENSITIVITY)     EKG  ED ECG REPORT I, Lavonia Drafts, the attending physician, personally viewed and interpreted this ECG.  Date: 06/19/2022  Rhythm: normal sinus rhythm QRS Axis: normal Intervals: normal ST/T Wave abnormalities: Nonspecific changes Narrative Interpretation: no evidence of acute ischemia    RADIOLOGY Chest x-ray viewed and interpreted by me, no acute abnormality    PROCEDURES:  Critical Care performed:   Procedures   MEDICATIONS ORDERED IN ED: Medications  iohexol (OMNIPAQUE) 350 MG/ML injection 75 mL (75 mLs Intravenous Contrast Given 06/19/22 2039)     IMPRESSION / MDM / ASSESSMENT AND PLAN / ED COURSE  I reviewed the triage vital signs and the nursing notes. Patient's presentation is most consistent with acute presentation with potential threat to life or bodily function.   Patient presents with chest pain as described above.  Differential includes arrhythmia, less likely ACS,  PE, pneumonia  Overall patient is well-appearing and in no acute distress, no cough or fever to suggest pneumonia, chest x-ray is clear.  No edema.  EKG not consistent with ACS, high sensitive troponin is normal.  Sent for CT angiography to rule out PE, reassuring scan  Patient has been on the monitor in the emergency department, no evidence of arrhythmia, heart rate is remained in sinus rhythm, rate around 80  Offered admission/observation for the patient however she feels comfortable with discharge given that she can easily see her cardiologist Dr. Darrold Junker.  She agrees to return immediately if any worsening symptoms.  Patient and family agree with this plan       FINAL CLINICAL IMPRESSION(S) / ED DIAGNOSES   Final diagnoses:  None     Rx / DC Orders   ED Discharge Orders     None        Note:  This document was prepared using Dragon voice recognition  software and may include unintentional dictation errors.   Jene Every, MD 06/19/22 2157

## 2022-06-19 NOTE — ED Triage Notes (Signed)
Pt to ED via EMS from home c/o mid chest pain and SOB today.  States was playing basketball with her kids when sharp pain came on, felt like heart racing, SOB to talk as well.  Denies recent illness, n/v/d, or cough.  States hx of hysterectomy 9 weeks ago and cleared for physical activity today.  Hx of neurocardiogenic syncope as well.  A&Ox4, chest rise even and unlabored, skin WNL and in NAD at this time.

## 2022-06-19 NOTE — ED Notes (Signed)
DC instructions given verbally and in writing, understanding voiced, signature obtained.  Pt left in stable condition. 

## 2022-06-20 ENCOUNTER — Telehealth: Payer: Medicare Other | Admitting: Psychiatry

## 2022-07-03 ENCOUNTER — Other Ambulatory Visit: Payer: Self-pay | Admitting: Psychiatry

## 2022-07-03 DIAGNOSIS — F3342 Major depressive disorder, recurrent, in full remission: Secondary | ICD-10-CM

## 2022-07-03 DIAGNOSIS — F411 Generalized anxiety disorder: Secondary | ICD-10-CM

## 2022-07-19 ENCOUNTER — Ambulatory Visit (INDEPENDENT_AMBULATORY_CARE_PROVIDER_SITE_OTHER): Payer: Medicare Other | Admitting: Licensed Clinical Social Worker

## 2022-07-19 DIAGNOSIS — F422 Mixed obsessional thoughts and acts: Secondary | ICD-10-CM

## 2022-07-19 DIAGNOSIS — F410 Panic disorder [episodic paroxysmal anxiety] without agoraphobia: Secondary | ICD-10-CM | POA: Diagnosis not present

## 2022-07-19 DIAGNOSIS — Z8659 Personal history of other mental and behavioral disorders: Secondary | ICD-10-CM | POA: Diagnosis not present

## 2022-07-19 DIAGNOSIS — F4310 Post-traumatic stress disorder, unspecified: Secondary | ICD-10-CM

## 2022-07-19 NOTE — Progress Notes (Unsigned)
Virtual Visit via Audio Note  I connected with Latasha [name] on 07/19/22 at  9:00 AM EST by an audio enabled telemedicine application and verified that I am speaking with the correct person using two identifiers.  Video connection was lost when less than 50% of the duration of the visit was complete, at which time the remainder of the visit was completed via audio only.  Location: Patient: home Provider: [facility]   I discussed the limitations of evaluation and management by telemedicine and the availability of in person appointments. The patient expressed understanding and agreed to proceed.   I discussed the assessment and treatment plan with the patient. The patient was provided an opportunity to ask questions and all were answered. The patient agreed with the plan and demonstrated an understanding of the instructions.   The patient was advised to call back or seek an in-person evaluation if the symptoms worsen or if the condition fails to improve as anticipated.  I provided 45 minutes of non-face-to-face time during this encounter.   Randall Rampersad R Rhythm Wigfall, LCSW   THERAPIST PROGRESS NOTE  Session Time: [phone]  Participation Level: Active  Behavioral Response: Neat and Well GroomedAlertAnxious  Type of Therapy: Individual Therapy  Treatment Goals addressed:   Problem: Decrease depressive symptoms and improve levels of effective functioning Goal: LTG: Reduce frequency, intensity, and duration of depression symptoms as evidenced by: SSB input needed on appropriate metric Outcome: Progressing Goal: STG: Latasha [name] WILL PARTICIPATE IN AT LEAST 80% OF SCHEDULED INDIVIDUAL PSYCHOTHERAPY SESSIONS Outcome: Progressing Intervention: Encourage verbalization of feelings/concerns/expectations Note: Allowed pt to explore and express   Problem: Reduce the negative impact trauma related symptoms have on social, occupational, and family  functioning. Goal: LTG: Reduce frequency, intensity, and duration of PTSD symptoms so daily functioning is improved: Input needed on appropriate metric.  pt self report Outcome: Progressing Goal: STG: Latasha [name] WILL EXPERIENCE A 80% REDUCTION IN EXAGGERATED BELIEFS ABOUT SELF AND OTHERS THAT INTERFERE WITH TRAUMA RESOLUTION AS EVIDENCED BY SELF-REPORT Outcome: Progressing Intervention: Encourage patient to identify triggers Note: Allowed pt to identify triggers past/present Intervention: Assist with relaxation techniques, as appropriate (deep breathing exercises, meditation, guided imagery) Note: Reviewed    Problem: Thought Disorders-obsessional preoccupation Goal: LTG: Sustain symptom remission and continue the gains made during the acute phase of treatment:  Input needed on appropriate metric Outcome: Progressing Goal: LTG: Increase coping skills to promote long-term recovery and improve ability to perform daily activities Outcome: Progressing      ProgressTowards Goals: Progressing  Interventions: CBT, Motivational Interviewing, and Supportive.  Summary: Latasha [name] is a 36 y.o. female who presents with improving symptoms related to PTSD and depression. Pt reports that mood and anxiety are fluctuating due to external stressors (acute health related concerns). Pt compliant with medications.   Allowed pt to explore and express thoughts and feelings associated with recent life situations and external stressors.Pt reports that she is still experiencing some stress associated with concerns over heart function. Pt reports that she had an incident of heart racing while playing basketball with her sons at the [facility] recently. Pt states that her heart rate stayed high for quite a while after she stopped playing and this triggered concern. Pt went to the ED and everything checked out OK. Pt wearing a halter and had echo done. Pt states she goes back to the DR on 12/21 for results.   Pt states  mood is "a roller coaster of ups and downs". Pt states that this  new heart-related concern is hard for her to let go, and she has more anxiety now that she is in the "waiting" phase of test results. Reviewed relaxation strategies and things that help pt find peace throughout the day, including taking personal time outs if necessary. Pt reports that she does have flashbacks at times of herself as a child admitted to the hospital. Reviewed grounding exercises and ways to feel safe in the moment.   Discussed eating behaviors--pt states that she is being intentional about eating at least 3 times per day. Pt is concerned that blood glucose was high--pt will continue to monitor that at times.   Continued recommendations are as follows: self care behaviors, positive social engagements, focusing on overall work/home/life balance, and focusing on positive physical and emotional wellness.   Suicidal/Homicidal: No  Therapist Response: Pt is continuing to apply interventions learned in session into daily life situations. Pt is currently on track to meet goals utilizing interventions mentioned above. Personal growth and progress fluctuating/intermittent at time of session. Treatment to continue as indicated.   Plan: Return again in 4 weeks.  Diagnosis:  Encounter Diagnoses  Name Primary?   PTSD (post-traumatic stress disorder) Yes   Mixed obsessional thoughts and acts    History of anorexia nervosa    Panic attacks     Collaboration of Care: Other Pt encouraged to continue medication management services with psychiatrist of record, Dr. Elna Breslow  Patient/Guardian was advised Release of Information must be obtained prior to any record release in order to collaborate their care with an outside provider. Patient/Guardian was advised if they have not already done so to contact the registration department to sign all necessary forms in order for [place] to release information regarding their care.   Consent:  Patient/Guardian gives verbal consent for treatment and assignment of benefits for services provided during this visit. Patient/Guardian expressed understanding and agreed to proceed.   [name] Docie Abramovich, LCSW 07/20/2022

## 2022-07-19 NOTE — Plan of Care (Signed)
  Problem: Decrease depressive symptoms and improve levels of effective functioning Goal: LTG: Reduce frequency, intensity, and duration of depression symptoms as evidenced by: SSB input needed on appropriate metric Outcome: Progressing Goal: STG: Keeshia WILL PARTICIPATE IN AT LEAST 80% OF SCHEDULED INDIVIDUAL PSYCHOTHERAPY SESSIONS Outcome: Progressing Intervention: Encourage verbalization of feelings/concerns/expectations Note: Allowed pt to explore and express   Problem: Reduce the negative impact trauma related symptoms have on social, occupational, and family functioning. Goal: LTG: Reduce frequency, intensity, and duration of PTSD symptoms so daily functioning is improved: Input needed on appropriate metric.  pt self report Outcome: Progressing Goal: STG: Ladine WILL EXPERIENCE A 80% REDUCTION IN EXAGGERATED BELIEFS ABOUT SELF AND OTHERS THAT INTERFERE WITH TRAUMA RESOLUTION AS EVIDENCED BY SELF-REPORT Outcome: Progressing Intervention: Encourage patient to identify triggers Note: Allowed pt to identify triggers past/present Intervention: Assist with relaxation techniques, as appropriate (deep breathing exercises, meditation, guided imagery) Note: Reviewed    Problem: Thought Disorders-obsessional preoccupation Goal: LTG: Sustain symptom remission and continue the gains made during the acute phase of treatment:  Input needed on appropriate metric Outcome: Progressing Goal: LTG: Increase coping skills to promote long-term recovery and improve ability to perform daily activities Outcome: Progressing

## 2022-08-05 ENCOUNTER — Encounter: Payer: Self-pay | Admitting: Psychiatry

## 2022-08-05 ENCOUNTER — Telehealth (INDEPENDENT_AMBULATORY_CARE_PROVIDER_SITE_OTHER): Payer: Medicare Other | Admitting: Psychiatry

## 2022-08-05 DIAGNOSIS — F5101 Primary insomnia: Secondary | ICD-10-CM

## 2022-08-05 DIAGNOSIS — F4310 Post-traumatic stress disorder, unspecified: Secondary | ICD-10-CM

## 2022-08-05 DIAGNOSIS — F331 Major depressive disorder, recurrent, moderate: Secondary | ICD-10-CM | POA: Diagnosis not present

## 2022-08-05 DIAGNOSIS — R002 Palpitations: Secondary | ICD-10-CM | POA: Insufficient documentation

## 2022-08-05 DIAGNOSIS — F411 Generalized anxiety disorder: Secondary | ICD-10-CM | POA: Diagnosis not present

## 2022-08-05 DIAGNOSIS — F5000 Anorexia nervosa, unspecified: Secondary | ICD-10-CM

## 2022-08-05 MED ORDER — ESCITALOPRAM OXALATE 20 MG PO TABS: 20.0000 mg | ORAL_TABLET | Freq: Every day | ORAL | 1 refills | Status: DC

## 2022-08-05 NOTE — Progress Notes (Unsigned)
Virtual Visit via Video Note  I connected with Latasha [name] on 08/05/22 at  2:30 PM EST by a video enabled telemedicine application and verified that I am speaking with the correct person using two identifiers.  Location Provider Location : [facility] Patient Location : Home  Participants: Patient , Provider    I discussed the limitations of evaluation and management by telemedicine and the availability of in person appointments. The patient expressed understanding and agreed to proceed.    I discussed the assessment and treatment plan with the patient. The patient was provided an opportunity to ask questions and all were answered. The patient agreed with the plan and demonstrated an understanding of the instructions.   The patient was advised to call back or seek an in-person evaluation if the symptoms worsen or if the condition fails to improve as anticipated.  BH MD OP Progress Note  08/06/2022 10:03 AM Latasha [name]  MRN:  [id number]  Chief Complaint:  Chief Complaint  Patient presents with   Follow-up   Medication Refill   Anxiety   HPI: Latasha [name] is a 36 year old Caucasian female on SSD, lives in [place], has a history of GAD, PTSD, MDD, anorexia nervosa, primary insomnia, Hirschsprung's disease, iron deficiency anemia, chronic constipation, chronic fatigue syndrome was evaluated by telemedicine today.  Patient today reports she is currently struggling with depressive symptoms, struggling with sadness, anhedonia, increased sleepiness during the day, low energy, concentration problem.  This has been getting worse since the past several days.  Reports she has several situational stressors.  Currently struggling with health problems.  Reports she had to go to the emergency department recently with tachycardia.  She was playing basketball with her children and her heart rate went up and would not stop for several hours.  Patient reports she is currently awaiting a  follow-up with cardiology.  Had to do several testing including Holter monitor for a week or so.  Patient also reports she has a mole on her back, suspicious for melanoma and had to do biopsy.  Currently awaiting results.  All this does make her anxious and likely contributing to depressive symptoms.  Patient currently compliant on medications.  Denies side effects to Lexapro or BuSpar.  Uses the zolpidem only as needed.  Follows up with her therapist on a regular basis, motivated to stay in therapy.  Denies any suicidality, homicidality or perceptual disturbances.  Patient denies any other concerns today.  Visit Diagnosis:    ICD-10-CM   1. GAD (generalized anxiety disorder)  F41.1 escitalopram (LEXAPRO) 20 MG tablet    2. MDD (major depressive disorder), recurrent episode, moderate (HCC)  F33.1 escitalopram (LEXAPRO) 20 MG tablet    3. PTSD (post-traumatic stress disorder)  F43.10 escitalopram (LEXAPRO) 20 MG tablet    4. Anorexia nervosa  F50.00     5. Primary insomnia  F51.01       Past Psychiatric History: Reviewed past psychiatric history from progress note on 09/26/2020.  Past trials of Seroquel, Wellbutrin, BuSpar, Prozac, Paxil, Xanax, Effexor, Lexapro, Zoloft, Cymbalta.  Past Medical History:  Past Medical History:  Diagnosis Date   Acid reflux    Anemia    Anxiety    Asthma    Chronic constipation    Hirschsprung disease    Pelvic floor dysfunction    Syncope, cardiogenic     Past Surgical History:  Procedure Laterality Date   abdomial adhesions removed  08/19/2010   ABDOMINAL HYSTERECTOMY  APPENDECTOMY  08/20/1995   bladder repair surgery  08/20/1995   c- sections  [id number]   enodmetiosis removed  08/19/2010   OVARIAN CYST REMOVAL  2003, 2012   TONSILLECTOMY  08/19/2005    Family Psychiatric History: Reviewed family psych history from progress note on 09/26/2020.  Family History:  Family History  Problem Relation Age of Onset   Cancer Mother     Diabetes Mother    Hypertension Mother    Cancer Father    Alcohol abuse Father    Depression Father    Cancer Maternal Grandfather    Factor IX deficiency Maternal Grandfather    Factor IX deficiency Son    ADD / ADHD Son    Autism Son    Kidney cancer Neg Hx    Bladder Cancer Neg Hx     Social History: Reviewed social history from progress note on 09/26/2020. Social History   Socioeconomic History   Marital status: Single    Spouse name: Not on file   Number of children: 2   Years of education: 12 th grade, some college   Highest education level: Not on file  Occupational History   Occupation: disabled  Tobacco Use   Smoking status: Never   Smokeless tobacco: Never  Substance and Sexual Activity   Alcohol use: No   Drug use: No   Sexual activity: Yes    Partners: Male    Birth control/protection: Condom, Pill  Other Topics Concern   Not on file  Social History Narrative   Not on file   Social Determinants of Health   Financial Resource Strain: Not on file  Food Insecurity: Not on file  Transportation Needs: Not on file  Physical Activity: Not on file  Stress: Not on file  Social Connections: Not on file    Allergies:  Allergies  Allergen Reactions   Meperidine Anaphylaxis    Other reaction(s): Other (See Comments) Other Reaction: Not Assessed   Lactose     Other reaction(s): Other (See Comments) Other Reaction: GI Upset   Fludrocortisone Hives   Levofloxacin Hives   Sulfamethoxazole-Trimethoprim Other (See Comments)    Other reaction(s): Dizziness    Metabolic Disorder Labs: No results found for: "HGBA1C", "MPG" No results found for: "PROLACTIN" No results found for: "CHOL", "TRIG", "HDL", "CHOLHDL", "VLDL", "LDLCALC" Lab Results  Component Value Date   TSH 1.276 10/30/2020    Therapeutic Level Labs: No results found for: "LITHIUM" No results found for: "VALPROATE" No results found for: "CBMZ"  Current Medications: Current Outpatient  Medications  Medication Sig Dispense Refill   Acidophilus Lactobacillus CAPS Take 1 tablet by mouth daily.     albuterol (VENTOLIN HFA) 108 (90 Base) MCG/ACT inhaler Inhale 1 puff into the lungs every 6 (six) hours as needed for wheezing or shortness of breath.      busPIRone (BUSPAR) 15 MG tablet TAKE 1 TABLET(15 MG) BY MOUTH TWICE DAILY 60 tablet 2   EPINEPHrine (EPIPEN 2-PAK) 0.3 mg/0.3 mL IJ SOAJ injection Inject 0.3 mg into the muscle as needed. Reported on 02/07/2016     escitalopram (LEXAPRO) 20 MG tablet Take 1 tablet (20 mg total) by mouth daily. 30 tablet 1   fluticasone (FLONASE) 50 MCG/ACT nasal spray SHAKE LQ AND U 2 SPRAYS IEN QD     hydrOXYzine (ATARAX/VISTARIL) 25 MG tablet Take 1 tablet (25 mg total) by mouth 2 (two) times daily as needed. Severe anxiety attacks only 60 tablet 1   midodrine (PROAMATINE) 10 MG tablet  Take 10 mg by mouth daily.      ondansetron (ZOFRAN) 4 MG tablet Take 4 mg by mouth every 8 (eight) hours as needed for nausea.     ondansetron (ZOFRAN-ODT) 4 MG disintegrating tablet      promethazine (PHENERGAN) 25 MG tablet Take 25 mg by mouth every 8 (eight) hours as needed for vomiting.     Sodium Phosphates (RA ENEMA) 7-19 GM/118ML ENEM Place rectally as needed (constipation).     zolpidem (AMBIEN) 5 MG tablet Take 1 tablet (5 mg total) by mouth at bedtime as needed for sleep. 30 tablet 2   No current facility-administered medications for this visit.   Facility-Administered Medications Ordered in Other Visits  Medication Dose Route Frequency Provider Last Rate Last Admin   0.9 %  sodium chloride infusion   Intravenous Continuous Sindy Guadeloupe, MD   Stopped at 05/09/21 1055   0.9 %  sodium chloride infusion   Intravenous Continuous Jacquelin Hawking, NP   Stopped at 05/11/21 1051   0.9 %  sodium chloride infusion   Intravenous Continuous Sindy Guadeloupe, MD 10 mL/hr at 05/16/21 1015 New Bag at 05/16/21 1015     Musculoskeletal: Strength & Muscle Tone:   UTA Gait & Station:  Seated Patient leans: N/A  Psychiatric Specialty Exam: Review of Systems  Constitutional:  Positive for fatigue.  Psychiatric/Behavioral:  Positive for decreased concentration, dysphoric mood and sleep disturbance. The patient is nervous/anxious.   All other systems reviewed and are negative.   Last menstrual period 05/06/2017.There is no height or weight on file to calculate BMI.  General Appearance: Casual  Eye Contact:  Fair  Speech:  Clear and Coherent  Volume:  Normal  Mood:  Anxious and Depressed  Affect:  Congruent  Thought Process:  Goal Directed and Descriptions of Associations: Intact  Orientation:  Full (Time, Place, and Person)  Thought Content: Logical   Suicidal Thoughts:  No  Homicidal Thoughts:  No  Memory:  Immediate;   Fair Recent;   Fair Remote;   Fair  Judgement:  Fair  Insight:  Fair  Psychomotor Activity:  Normal  Concentration:  Concentration: Fair and Attention Span: Fair  Recall:  [employer] of Knowledge: Fair  Language: Fair  Akathisia:  No  Handed:  Right  AIMS (if indicated): not done  Assets:  Communication Skills Desire for Improvement Housing Social Support  ADL's:  Intact  Cognition: WNL  Sleep:   Excessive   Screenings: AIMS    Flowsheet Row Video Visit from 01/02/2022 in [place] Total Score 0      GAD-7    Flowsheet Row Video Visit from 03/20/2022 in [place] Video Visit from 01/02/2022 in [place] Video Visit from 11/30/2021 in [place] Video Visit from 10/22/2021 in [place] Video Visit from 10/05/2021 in [place]  Total GAD-7 Score 1 4 8 10 14       PHQ2-9    [place] Video Visit from 08/05/2022 in [place] from 07/19/2022 in [place] Video Visit from 03/20/2022 in [place] Video Visit from 01/02/2022 in [place] Video Visit from 11/30/2021 in [place]  PHQ-2 Total Score 6 0 0 1 2  PHQ-9 Total Score 15 -- -- -- 4      Flowsheet Row Video Visit from 08/05/2022 in [place]  [facility] Counselor from 07/19/2022 in [place] ED from 06/19/2022 in [place]  C-SSRS RISK CATEGORY No Risk No Risk No Risk        Assessment and Plan: Latasha [name] is a 36 year old Caucasian female on disability, single, lives at [facility] has a history of MDD, GAD, PTSD was evaluated by telemedicine today.  Patient is currently struggling with depression symptoms, will benefit from the following plan.  Plan GAD-unstable Patient advised to have more frequent psychotherapy sessions with Ms. Christina Hussami Hydroxyzine 25 mg p.o. twice daily as needed for severe anxiety Continue Buspar 15 mg p.o. twice daily-reduced dosage  MDD-unstable Increase Lexapro to 20 milligram p.o. daily Continue CBT  PTSD-stable BuSpar and Lexapro as prescribed Continue CBT  Anorexia nervosa-stable Continue CBT  Primary insomnia-improving Patient currently depressed and struggling with excessive sleepiness. Patient uses the zolpidem only as needed.  Will continue the same. Reviewed [place] PMP AWARxE  Follow-up in clinic in 4 weeks or sooner if needed.  Collaboration of Care: Collaboration of Care: Referral or follow-up with counselor/therapist AEB encouraged to follow up with therapist as well as cardiologist.  Will coordinate care.  Patient to sign an ROI to obtain medical records from cardiology.  Patient/Guardian was advised Release of Information must be obtained prior to any record release in order to collaborate their care with an outside provider.  Patient/Guardian was advised if they have not already done so to contact the registration department to sign all necessary forms in order for [place] to release information regarding their care.   Consent: Patient/Guardian gives verbal consent for treatment and assignment of benefits for services provided during this visit. Patient/Guardian expressed understanding and agreed to proceed.   This note was generated in part or whole with voice recognition software. Voice recognition is usually quite accurate but there are transcription errors that can and very often do occur. I apologize for any typographical errors that were not detected and corrected.      Ursula Alert, MD 08/06/2022, 10:03 AM

## 2022-09-02 ENCOUNTER — Ambulatory Visit (INDEPENDENT_AMBULATORY_CARE_PROVIDER_SITE_OTHER): Payer: Medicare Other | Admitting: Licensed Clinical Social Worker

## 2022-09-02 DIAGNOSIS — F4310 Post-traumatic stress disorder, unspecified: Secondary | ICD-10-CM

## 2022-09-02 DIAGNOSIS — F422 Mixed obsessional thoughts and acts: Secondary | ICD-10-CM | POA: Diagnosis not present

## 2022-09-02 DIAGNOSIS — F5000 Anorexia nervosa, unspecified: Secondary | ICD-10-CM | POA: Diagnosis not present

## 2022-09-02 NOTE — Progress Notes (Signed)
Virtual Visit via Audio Note  I connected with Latasha [name] on 07/19/22 at  9:00 AM EST by an audio enabled telemedicine application and verified that I am speaking with the correct person using two identifiers.  Video connection was lost when less than 50% of the duration of the visit was complete, at which time the remainder of the visit was completed via audio only.  Location: Patient: home Provider: [facility]   I discussed the limitations of evaluation and management by telemedicine and the availability of in person appointments. The patient expressed understanding and agreed to proceed.   I discussed the assessment and treatment plan with the patient. The patient was provided an opportunity to ask questions and all were answered. The patient agreed with the plan and demonstrated an understanding of the instructions.   The patient was advised to call back or seek an in-person evaluation if the symptoms worsen or if the condition fails to improve as anticipated.  I provided 45 minutes of non-face-to-face time during this encounter.   [place], LCSW   THERAPIST PROGRESS NOTE  Session Time: [id number]  Participation Level: Active  Behavioral Response: Neat and Well GroomedAlertAnxious  Type of Therapy: Individual Therapy  Treatment Goals addressed:Increase coping skills to promote long-term recovery and improve ability to perform daily activities  Reduce frequency, intensity, and duration of PTSD symptoms so daily functioning is improved: Input needed on appropriate metric. pt self report   Reduce frequency, intensity, and duration of depression symptoms as evidenced by: SSB input needed on appropriate metric   ProgressTowards Goals: Progressing  Interventions: CBT, Motivational Interviewing, and Supportive.  Summary: Latasha [name] is a 37 y.o. female who presents with improving symptoms related to PTSD and depression. Pt  reports that mood and anxiety are fluctuating due to external stressors (acute health related concerns). Pt compliant with medications.   Allowed pt to explore and express thoughts and feelings associated with recent life situations and external stressorsPatient reports ongoing stress associated with health related concerns in herself, and in her children. Patient reports she also has been worried about the mental health of her children's father--patient states that he was recently hospitalized due to having a seizure at the movie theater. Patient states that he said to her that he took too many CBD gummies, and that was what triggered the seizure. Patient states they are not together romantically but he continues to be her best friend, so she's very worried about his health. patient states that she has a lesion that was found to be cancerous that needs to be removed surgically--patient will have that done later on in January.  Allowed patient safe space to explore her family relationships and to identify anxiety and stress triggers. Discussed patients coping mechanisms--patient states she's doing the best she can right now. States that she fears that she has some cardiac issues going on because of chest pains she experiences intermittently.   Patient reports that she feels her eating behaviors are not healthy, and she would like to take next steps to manage her eating behaviors. Discussed need for patient to find a therapist that works with eating disorders, or reach out to psychiatrist about referring for a possible inpatient admission. patient reports that she does not feel that she meets the BMI criteria for eating disorder. Encourage patient to reach out to her insurance company for individual therapists, and crisis eating disorder resources in [place] and [place]. Patient states that she is  motivated to take next steps on her own  patient reports that she does not feel improvement and would  like to engage in a higher level of outpatient or inpatient care at this time. Patient states her mother also has concerns of her overall well-being.  Continued recommendations are as follows: self care behaviors, positive social engagements, focusing on overall work/home/life balance, and focusing on positive physical and emotional wellness.   Suicidal/Homicidal: No  Therapist Response: Pt is continuing to apply interventions learned in session into daily life situations. Pt is currently on track to meet goals utilizing interventions mentioned above. Personal growth and progress fluctuating/intermittent at time of session. Treatment to continue as indicated.   Plan: Return again in 4 weeks. Pt to seek higher level of OPT care: more frequent in person sessions or inpatient. Consult w/ psychiatrist re: next steps. Encouraged pt to go ahead and reach out to community eating disorder clinics and/or outpatient therapists focusing on intensive eating disorder work.   Diagnosis:  Encounter Diagnoses  Name Primary?   PTSD (post-traumatic stress disorder) Yes   Mixed obsessional thoughts and acts    Anorexia nervosa     Collaboration of Care: Other Pt encouraged to continue medication management services with psychiatrist of record, Dr. Shea Evans  Patient/Guardian was advised Release of Information must be obtained prior to any record release in order to collaborate their care with an outside provider. Patient/Guardian was advised if they have not already done so to contact the registration department to sign all necessary forms in order for [place] to release information regarding their care.   Consent: Patient/Guardian gives verbal consent for treatment and assignment of benefits for services provided during this visit. Patient/Guardian expressed understanding and agreed to proceed.   [place], LCSW 09/02/2022

## 2022-09-04 ENCOUNTER — Other Ambulatory Visit: Payer: Self-pay | Admitting: Psychiatry

## 2022-09-04 DIAGNOSIS — F331 Major depressive disorder, recurrent, moderate: Secondary | ICD-10-CM

## 2022-09-04 DIAGNOSIS — F411 Generalized anxiety disorder: Secondary | ICD-10-CM

## 2022-09-04 DIAGNOSIS — F4310 Post-traumatic stress disorder, unspecified: Secondary | ICD-10-CM

## 2022-09-16 ENCOUNTER — Inpatient Hospital Stay: Payer: Medicare Other

## 2022-09-19 ENCOUNTER — Ambulatory Visit: Payer: Medicare Other | Admitting: Psychiatry

## 2022-09-23 ENCOUNTER — Telehealth: Payer: Medicare Other | Admitting: Internal Medicine

## 2022-09-30 ENCOUNTER — Ambulatory Visit (HOSPITAL_COMMUNITY): Payer: Medicare Other | Admitting: Licensed Clinical Social Worker

## 2022-10-03 ENCOUNTER — Encounter: Payer: Self-pay | Admitting: Psychiatry

## 2022-10-03 ENCOUNTER — Ambulatory Visit (INDEPENDENT_AMBULATORY_CARE_PROVIDER_SITE_OTHER): Payer: Medicare Other | Admitting: Psychiatry

## 2022-10-03 VITALS — BP 112/75 | HR 71 | Temp 98.1°F | Ht 65.0 in | Wt 125.4 lb

## 2022-10-03 DIAGNOSIS — F411 Generalized anxiety disorder: Secondary | ICD-10-CM | POA: Diagnosis not present

## 2022-10-03 DIAGNOSIS — F5000 Anorexia nervosa, unspecified: Secondary | ICD-10-CM

## 2022-10-03 DIAGNOSIS — F5101 Primary insomnia: Secondary | ICD-10-CM

## 2022-10-03 DIAGNOSIS — F3341 Major depressive disorder, recurrent, in partial remission: Secondary | ICD-10-CM | POA: Diagnosis not present

## 2022-10-03 DIAGNOSIS — F4310 Post-traumatic stress disorder, unspecified: Secondary | ICD-10-CM | POA: Diagnosis not present

## 2022-10-03 MED ORDER — ESCITALOPRAM OXALATE 20 MG PO TABS: 20.0000 mg | ORAL_TABLET | Freq: Every day | ORAL | 2 refills | Status: DC

## 2022-10-03 MED ORDER — BUSPIRONE HCL 15 MG PO TABS: ORAL_TABLET | ORAL | 2 refills | Status: DC

## 2022-10-03 NOTE — Progress Notes (Signed)
[facility] OP Progress Note  10/03/2022 10:03 AM [name]  MRN:  [id number]  Chief Complaint:  Chief Complaint  Patient presents with   Follow-up   Anxiety   Medication Refill   HPI: Latasha [name] is a 37 year old Caucasian female on SSD, lives in [place], has a history of GAD, PTSD, MDD, anorexia nervosa, primary insomnia, Hirschsprung's disease, iron deficiency anemia, chronic constipation, chronic fatigue syndrome was evaluated in office today.  Patient today reports she is currently established care with a new therapist at [facility] in [place].  She reports she is currently seeing her therapist on a weekly basis and that has tremendously helped.  She reports she was going through a lot of situational stressors in January, she was just diagnosed with skin cancer and other problems.  She however reports she currently feels better.  She had her skin cancer surgery completed and is currently recovering.  She reports she is managing her eating habits better.  Reports she continues to restrict however she is taking a healthy diet, eats 2-3 meals per day.  Has not lost any significant weight in the past 3 months.  Denies any other eating disorder symptoms.  Patient appeared to be alert, oriented to person place time situation.  Denies any suicidality, homicidality or perceptual disturbances.  Currently compliant on Lexapro, BuSpar, denies side effects.  Denies any other concerns today.  Visit Diagnosis:    ICD-10-CM   1. GAD (generalized anxiety disorder)  F41.1 busPIRone (BUSPAR) 15 MG tablet    escitalopram (LEXAPRO) 20 MG tablet    2. Recurrent major depressive disorder, in partial remission (HCC)  F33.41 busPIRone (BUSPAR) 15 MG tablet    3. PTSD (post-traumatic stress disorder)  F43.10 escitalopram (LEXAPRO) 20 MG tablet    4. Anorexia nervosa  F50.00     5. Primary insomnia  F51.01       Past Psychiatric History: Reviewed past psychiatric history  from progress note on 09/26/2020.  Past trials of Seroquel, Wellbutrin, BuSpar, Prozac, Paxil, Xanax, Effexor, Lexapro, Zoloft, Cymbalta.  Past Medical History:  Past Medical History:  Diagnosis Date   Acid reflux    Anemia    Anxiety    Asthma    Chronic constipation    Hirschsprung disease    Pelvic floor dysfunction    Syncope, cardiogenic     Past Surgical History:  Procedure Laterality Date   abdomial adhesions removed  08/19/2010   ABDOMINAL HYSTERECTOMY     APPENDECTOMY  08/20/1995   bladder repair surgery  08/20/1995   c- sections  [id number]   enodmetiosis removed  08/19/2010   OVARIAN CYST REMOVAL  2003, 2012   TONSILLECTOMY  08/19/2005    Family Psychiatric History: Reviewed family psychiatric history from progress note on 09/26/2020.  Family History:  Family History  Problem Relation Age of Onset   Cancer Mother    Diabetes Mother    Hypertension Mother    Cancer Father    Alcohol abuse Father    Depression Father    Cancer Maternal Grandfather    Factor IX deficiency Maternal Grandfather    Factor IX deficiency Son    ADD / ADHD Son    Autism Son    Kidney cancer Neg Hx    Bladder Cancer Neg Hx     Social History: Reviewed social history from progress note on 09/26/2020. Social History   Socioeconomic History   Marital status: Single    Spouse name: Not on  file   Number of children: 2   Years of education: 12 th grade, some college   Highest education level: Not on file  Occupational History   Occupation: disabled  Tobacco Use   Smoking status: Never   Smokeless tobacco: Never  Substance and Sexual Activity   Alcohol use: No   Drug use: No   Sexual activity: Yes    Partners: Male    Birth control/protection: Condom, Pill  Other Topics Concern   Not on file  Social History Narrative   Not on file   Social Determinants of Health   Financial Resource Strain: Not on file  Food Insecurity: Not on file  Transportation Needs: Not on file   Physical Activity: Not on file  Stress: Not on file  Social Connections: Not on file    Allergies:  Allergies  Allergen Reactions   Meperidine Anaphylaxis    Other reaction(s): Other (See Comments) Other Reaction: Not Assessed   Lactose     Other reaction(s): Other (See Comments) Other Reaction: GI Upset   Fludrocortisone Hives   Levofloxacin Hives   Sulfamethoxazole-Trimethoprim Other (See Comments)    Other reaction(s): Dizziness    Metabolic Disorder Labs: No results found for: "HGBA1C", "MPG" No results found for: "PROLACTIN" No results found for: "CHOL", "TRIG", "HDL", "CHOLHDL", "VLDL", "LDLCALC" Lab Results  Component Value Date   TSH 1.276 10/30/2020    Therapeutic Level Labs: No results found for: "LITHIUM" No results found for: "VALPROATE" No results found for: "CBMZ"  Current Medications: Current Outpatient Medications  Medication Sig Dispense Refill   Acidophilus Lactobacillus CAPS Take 1 tablet by mouth daily.     albuterol (VENTOLIN HFA) 108 (90 Base) MCG/ACT inhaler Inhale 1 puff into the lungs every 6 (six) hours as needed for wheezing or shortness of breath.      EPINEPHrine (EPIPEN 2-PAK) 0.3 mg/0.3 mL IJ SOAJ injection Inject 0.3 mg into the muscle as needed. Reported on 02/07/2016     fluticasone (FLONASE) 50 MCG/ACT nasal spray SHAKE LQ AND U 2 SPRAYS IEN QD     hydrOXYzine (ATARAX/VISTARIL) 25 MG tablet Take 1 tablet (25 mg total) by mouth 2 (two) times daily as needed. Severe anxiety attacks only 60 tablet 1   midodrine (PROAMATINE) 10 MG tablet Take 10 mg by mouth daily.      ondansetron (ZOFRAN) 4 MG tablet Take 4 mg by mouth every 8 (eight) hours as needed for nausea.     ondansetron (ZOFRAN-ODT) 4 MG disintegrating tablet      promethazine (PHENERGAN) 25 MG tablet Take 25 mg by mouth every 8 (eight) hours as needed for vomiting.     Sodium Phosphates (RA ENEMA) 7-19 GM/118ML ENEM Place rectally as needed (constipation).     zolpidem (AMBIEN)  5 MG tablet Take 1 tablet (5 mg total) by mouth at bedtime as needed for sleep. 30 tablet 2   busPIRone (BUSPAR) 15 MG tablet TAKE 1 TABLET(15 MG) BY MOUTH TWICE DAILY 60 tablet 2   escitalopram (LEXAPRO) 20 MG tablet Take 1 tablet (20 mg total) by mouth daily. 30 tablet 2   No current facility-administered medications for this visit.   Facility-Administered Medications Ordered in Other Visits  Medication Dose Route Frequency Provider Last Rate Last Admin   0.9 %  sodium chloride infusion   Intravenous Continuous Sindy Guadeloupe, MD   Stopped at 05/09/21 1055   0.9 %  sodium chloride infusion   Intravenous Continuous Quay Burow Wandra Feinstein, NP  Stopped at 05/11/21 1051   0.9 %  sodium chloride infusion   Intravenous Continuous Sindy Guadeloupe, MD 10 mL/hr at 05/16/21 1015 New Bag at 05/16/21 1015     Musculoskeletal: Strength & Muscle Tone: within normal limits Gait & Station: normal Patient leans: N/A  Psychiatric Specialty Exam: Review of Systems  Psychiatric/Behavioral:  The patient is nervous/anxious.   All other systems reviewed and are negative.   Blood pressure 112/75, pulse 71, temperature 98.1 F (36.7 C), temperature source Skin, height 5' 5"$  (1.651 m), weight 125 lb 6.4 oz (56.9 kg), last menstrual period 05/06/2017.Body mass index is 20.87 kg/m.  General Appearance: Casual  Eye Contact:  Fair  Speech:  Clear and Coherent  Volume:  Normal  Mood:  Anxious  Affect:  Appropriate  Thought Process:  Goal Directed and Descriptions of Associations: Intact  Orientation:  Full (Time, Place, and Person)  Thought Content: Logical   Suicidal Thoughts:  No  Homicidal Thoughts:  No  Memory:  Immediate;   Fair Recent;   Fair Remote;   Fair  Judgement:  Fair  Insight:  Fair  Psychomotor Activity:  Normal  Concentration:  Concentration: Fair and Attention Span: Fair  Recall:  [employer] of Knowledge: Fair  Language: Fair  Akathisia:  No  Handed:  Right  AIMS (if indicated): not  done  Assets:  Communication Skills Desire for Improvement Housing Social Support  ADL's:  Intact  Cognition: WNL  Sleep:  Fair   Screenings: AIMS    Flowsheet Row Video Visit from 01/02/2022 in [facility] Total Score 0      [facility] Office Visit from 10/03/2022 in [facility] Video Visit from 03/20/2022 in [facility] Video Visit from 01/02/2022 in [facility] Video Visit from 11/30/2021 in [facility] Video Visit from 10/22/2021 in [facility]  Total GAD-7 Score 9 1 4 8 10      $ PHQ2-9    [facility] Office Visit from 10/03/2022 in [facility] Video Visit from 08/05/2022 in [facility] Counselor from 07/19/2022 in [facility] at [facility] Video Visit from 03/20/2022 in [facility] Video Visit from 01/02/2022 in [facility]  PHQ-2 Total Score 3 6 0 0 1  PHQ-9 Total Score 8 15 -- -- --      [place] Office Visit from 10/03/2022 in [facility] Video Visit from 08/05/2022 in [facility] Counselor from 07/19/2022 in [place] at [facility] No Risk No Risk No Risk        Assessment and Plan: Latasha [name] is a 37 year old Caucasian female on disability, single, lives at [facility], has a history of MDD, GAD, PTSD was evaluated in office today.  Patient is currently improving, plan as noted below.  Plan  GAD-improving BuSpar 15 mg p.o. twice daily-reduced dosage Hydroxyzine 25 mg p.o. twice daily  as needed Continue psychotherapy with new therapist-[facility], [place]  MDD in partial remission Lexapro 20 mg p.o. daily Continue CBT  Anorexia Nervosa-improving Continue psychotherapy.  Primary insomnia-stable Patient to continue to work on sleep hygiene techniques Zolpidem 5 mg p.o. nightly as needed Reviewed [place]  PMP AWARxE  Patient reports she continues to have  visits with her previous therapist Ms. Christina Hussami, will coordinate care.  Patient did sign an ROI to obtain medical records and coordinate care with new therapist.  Follow-up in clinic in 2 months or sooner in person.  This note was generated in part or whole with voice recognition software. Voice recognition is usually quite accurate but there are transcription errors that can and very often do occur. I apologize for any typographical errors that were not detected and corrected.     Ursula Alert, MD 10/03/2022, 3:06 PM

## 2022-10-30 ENCOUNTER — Other Ambulatory Visit: Payer: Self-pay | Admitting: *Deleted

## 2022-10-30 DIAGNOSIS — D509 Iron deficiency anemia, unspecified: Secondary | ICD-10-CM

## 2022-10-31 ENCOUNTER — Inpatient Hospital Stay: Payer: Medicare Other | Attending: Internal Medicine

## 2022-10-31 DIAGNOSIS — Q431 Hirschsprung's disease: Secondary | ICD-10-CM | POA: Insufficient documentation

## 2022-10-31 DIAGNOSIS — D500 Iron deficiency anemia secondary to blood loss (chronic): Secondary | ICD-10-CM | POA: Insufficient documentation

## 2022-10-31 DIAGNOSIS — D509 Iron deficiency anemia, unspecified: Secondary | ICD-10-CM

## 2022-10-31 DIAGNOSIS — Z90710 Acquired absence of both cervix and uterus: Secondary | ICD-10-CM | POA: Insufficient documentation

## 2022-10-31 LAB — CBC WITH DIFFERENTIAL (CANCER CENTER ONLY)
Abs Immature Granulocytes: 0.02 10*3/uL (ref 0.00–0.07)
Basophils Absolute: 0 10*3/uL (ref 0.0–0.1)
Basophils Relative: 1 %
Eosinophils Absolute: 0.1 10*3/uL (ref 0.0–0.5)
Eosinophils Relative: 2 %
HCT: 38.1 % (ref 36.0–46.0)
Hemoglobin: 12.6 g/dL (ref 12.0–15.0)
Immature Granulocytes: 0 %
Lymphocytes Relative: 26 %
Lymphs Abs: 1.4 10*3/uL (ref 0.7–4.0)
MCH: 31.8 pg (ref 26.0–34.0)
MCHC: 33.1 g/dL (ref 30.0–36.0)
MCV: 96.2 fL (ref 80.0–100.0)
Monocytes Absolute: 0.5 10*3/uL (ref 0.1–1.0)
Monocytes Relative: 10 %
Neutro Abs: 3.3 10*3/uL (ref 1.7–7.7)
Neutrophils Relative %: 61 %
Platelet Count: 196 10*3/uL (ref 150–400)
RBC: 3.96 MIL/uL (ref 3.87–5.11)
RDW: 12.7 % (ref 11.5–15.5)
WBC Count: 5.3 10*3/uL (ref 4.0–10.5)
nRBC: 0 % (ref 0.0–0.2)

## 2022-10-31 LAB — IRON AND TIBC
Iron: 183 ug/dL — ABNORMAL HIGH (ref 28–170)
Saturation Ratios: 57 % — ABNORMAL HIGH (ref 10.4–31.8)
TIBC: 322 ug/dL (ref 250–450)
UIBC: 139 ug/dL

## 2022-10-31 LAB — CMP (CANCER CENTER ONLY)
ALT: 27 U/L (ref 0–44)
AST: 22 U/L (ref 15–41)
Albumin: 4.4 g/dL (ref 3.5–5.0)
Alkaline Phosphatase: 39 U/L (ref 38–126)
Anion gap: 6 (ref 5–15)
BUN: 19 mg/dL (ref 6–20)
CO2: 25 mmol/L (ref 22–32)
Calcium: 8.9 mg/dL (ref 8.9–10.3)
Chloride: 105 mmol/L (ref 98–111)
Creatinine: 0.66 mg/dL (ref 0.44–1.00)
GFR, Estimated: >60 mL/min (ref >60)
Glucose, Bld: 92 mg/dL (ref 70–99)
Potassium: 4 mmol/L (ref 3.5–5.1)
Sodium: 136 mmol/L (ref 135–145)
Total Bilirubin: 0.8 mg/dL (ref 0.3–1.2)
Total Protein: 7 g/dL (ref 6.5–8.1)

## 2022-10-31 LAB — FERRITIN: Ferritin: 45 ng/mL (ref 11–307)

## 2022-11-04 ENCOUNTER — Inpatient Hospital Stay (HOSPITAL_BASED_OUTPATIENT_CLINIC_OR_DEPARTMENT_OTHER): Payer: Medicare Other | Admitting: Internal Medicine

## 2022-11-04 DIAGNOSIS — D649 Anemia, unspecified: Secondary | ICD-10-CM | POA: Diagnosis not present

## 2022-11-04 NOTE — Assessment & Plan Note (Addendum)
#    Iron deficient anemia: Question menorrhagia/poor tolerance of oral iron [constipation/history of disease].  Currently on maintenance IV iron.   #MARCH 2024- Iron studies- no Iron def noted.  Status post hysterectomy-Labs improved.  # Hirschsprung disease: Constipation stable.  # Menorrhagia [[facility]]-s/p TAH-has resolved patient's because of anemia.  #Since patient is clinically stable I think is reasonable for the patient to follow-up with PCP/can follow-up with [place] as needed.  Patient comfortable with the plan; to call us if any questions or concerns in the interim.  # DISPOSITION: # follow up as needed- Dr.B

## 2022-11-04 NOTE — Progress Notes (Signed)
Patient verified using two identifiers for virtual visit via telephone today.  Patient denies new problems/concerns today.   

## 2022-11-04 NOTE — Progress Notes (Signed)
I connected with Latasha [name] on 11/04/22 at  3:30 PM EDT by video enabled telemedicine visit and verified that I am speaking with the correct person using two identifiers.  I discussed the limitations, risks, security and privacy concerns of performing an evaluation and management service by telemedicine and the availability of in-person appointments. I also discussed with the patient that there may be a patient responsible charge related to this service. The patient expressed understanding and agreed to proceed.    Other persons participating in the visit and their role in the encounter: RN/medical reconciliation Patient's location: home Provider's location: office  Oncology History   No history exists.    Latest Reference Range & Units 10/31/22 09:03  Iron 28 - 170 ug/dL 183 (H)  UIBC ug/dL 139  TIBC 250 - 450 ug/dL 322  Saturation Ratios 10.4 - 31.8 % 57 (H)  Ferritin 11 - 307 ng/mL 45  (H): Data is abnormally high  Chief Complaint: Iron deficient anemia   History of present illness:Latasha [name] 37 y.o.  female with history of iron deficient anemia secondary to history of menorrhagia/poor tolerance to oral iron  is here for follow-up.  In the interim patient had hysterectomy.  Denies any other bleeding.   Patient denies new problems/concerns today  Observation/objective: Alert & oriented x 3. In No acute distress.   Assessment and plan: Normocytic anemia #  Iron deficient anemia: Question menorrhagia/poor tolerance of oral iron [constipation/history of disease].  Currently on maintenance IV iron.   #MARCH 2024- Iron studies- no Iron def noted.  Status post hysterectomy-Labs improved.  # Hirschsprung disease: Constipation stable.  # Menorrhagia [[facility]]-s/p TAH-has resolved patient's because of anemia.  #Since patient is clinically stable I think is reasonable for the patient to follow-up with PCP/can follow-up with [place] as needed.  Patient comfortable with the plan;  to call us if any questions or concerns in the interim.  # DISPOSITION: # follow up as needed- Dr.B   Follow-up instructions:  I discussed the assessment and treatment plan with the patient.  The patient was provided an opportunity to ask questions and all were answered.  The patient agreed with the plan and demonstrated understanding of instructions.  The patient was advised to call back or seek an in person evaluation if the symptoms worsen or if the condition fails to improve as anticipated.  Dr. Charlaine Dalton [facility] at [facility] 11/04/2022 3:45 PM

## 2022-11-07 ENCOUNTER — Telehealth: Payer: Self-pay

## 2022-11-07 DIAGNOSIS — F5101 Primary insomnia: Secondary | ICD-10-CM

## 2022-11-07 MED ORDER — ZOLPIDEM TARTRATE 5 MG PO TABS: 5.0000 mg | ORAL_TABLET | Freq: Every evening | ORAL | 1 refills | Status: DC | PRN

## 2022-11-07 NOTE — Telephone Encounter (Signed)
pt left a message that she needs a refill on the [place] she was last seen on 10-03-22 and next appt 12-05-22

## 2022-11-07 NOTE — Telephone Encounter (Signed)
I have sent zolpidem to pharmacy. 

## 2022-12-02 ENCOUNTER — Ambulatory Visit (HOSPITAL_COMMUNITY): Payer: Medicare Other | Admitting: Licensed Clinical Social Worker

## 2022-12-05 ENCOUNTER — Telehealth (INDEPENDENT_AMBULATORY_CARE_PROVIDER_SITE_OTHER): Payer: Medicare Other | Admitting: Psychiatry

## 2022-12-05 ENCOUNTER — Encounter: Payer: Self-pay | Admitting: Psychiatry

## 2022-12-05 DIAGNOSIS — F411 Generalized anxiety disorder: Secondary | ICD-10-CM | POA: Diagnosis not present

## 2022-12-05 DIAGNOSIS — F5000 Anorexia nervosa, unspecified: Secondary | ICD-10-CM

## 2022-12-05 DIAGNOSIS — F4310 Post-traumatic stress disorder, unspecified: Secondary | ICD-10-CM

## 2022-12-05 DIAGNOSIS — G4710 Hypersomnia, unspecified: Secondary | ICD-10-CM

## 2022-12-05 DIAGNOSIS — F3342 Major depressive disorder, recurrent, in full remission: Secondary | ICD-10-CM

## 2022-12-05 DIAGNOSIS — F5101 Primary insomnia: Secondary | ICD-10-CM

## 2022-12-05 DIAGNOSIS — R5383 Other fatigue: Secondary | ICD-10-CM

## 2022-12-05 NOTE — Progress Notes (Signed)
Virtual Visit via Video Note  I connected with Latasha [name] on 12/05/22 at  9:30 AM EDT by a video enabled telemedicine application and verified that I am speaking with the correct person using two identifiers.  Location Provider Location : [facility] Patient Location : Home  Participants: Patient , Provider I discussed the limitations of evaluation and management by telemedicine and the availability of in person appointments. The patient expressed understanding and agreed to proceed.    I discussed the assessment and treatment plan with the patient. The patient was provided an opportunity to ask questions and all were answered. The patient agreed with the plan and demonstrated an understanding of the instructions.   The patient was advised to call back or seek an in-person evaluation if the symptoms worsen or if the condition fails to improve as anticipated.   BH MD OP Progress Note  12/06/2022 12:32 PM [name]  MRN:  [id number]  Chief Complaint:  Chief Complaint  Patient presents with   Anxiety   Depression   Eating Disorder   Fatigue   Medication Refill   HPI: Latasha [name] is a 37 year old Caucasian female, on SSD, lives in [place], has a history of GAD, MDD, PTSD, anorexia nervosa, primary insomnia, Hirschsprung's disease, iron deficiency anemia, chronic constipation chronic fatigue syndrome was evaluated by telemedicine today. Patient today reports overall she is currently doing fairly well with regards to her mood symptoms.  Her anxiety symptoms are manageable.  She does not feel depressed.  Denies any trauma related symptoms at this time.  Sleep is better.  The Ambien does help.  Patient reports she is eating better, watching her diet.  Has not lost any weight recently.  She is currently following up with her therapist, Mr. Lenard Galloway with [facility] on a weekly basis.  That has been beneficial.  Patient however reports she has been struggling with  fatigue since a very long time.  That has not changed much although she has been sleeping okay.  Patient reports she does feel she needs to take naps during the day.  May have had a sleep study several years ago however she does not remember the results.  Patient denies any suicidality, homicidality or perceptual disturbances.  Patient denies any side effects to medications, compliant on them.  Denies any other concerns today.   Visit Diagnosis:    ICD-10-CM   1. GAD (generalized anxiety disorder)  F41.1     2. MDD (major depressive disorder), recurrent, in full remission  F33.42     3. PTSD (post-traumatic stress disorder)  F43.10     4. Anorexia nervosa  F50.00     5. Primary insomnia  F51.01     6. Fatigue, unspecified type  R53.83 Vitamin B12    TSH    7. Hypersomnia  G47.10       Past Psychiatric History: I have reviewed past psychiatric history from progress note on 09/26/2020.  Past trials of Seroquel, Wellbutrin, BuSpar, Prozac, Paxil, Xanax, Effexor, Lexapro, Zoloft, Cymbalta.  Past Medical History:  Past Medical History:  Diagnosis Date   Acid reflux    Anemia    Anxiety    Asthma    Chronic constipation    Hirschsprung disease    Pelvic floor dysfunction    Syncope, cardiogenic     Past Surgical History:  Procedure Laterality Date   abdomial adhesions removed  08/19/2010   ABDOMINAL HYSTERECTOMY     APPENDECTOMY  08/20/1995  bladder repair surgery  08/20/1995   c- sections  4098,1191   enodmetiosis removed  08/19/2010   OVARIAN CYST REMOVAL  2003, 2012   TONSILLECTOMY  08/19/2005    Family Psychiatric History: I have reviewed family psychiatric history from progress note on 09/26/2020.  Family History:  Family History  Problem Relation Age of Onset   Cancer Mother    Diabetes Mother    Hypertension Mother    Cancer Father    Alcohol abuse Father    Depression Father    Cancer Maternal Grandfather    Factor IX deficiency Maternal Grandfather     Factor IX deficiency Son    ADD / ADHD Son    Autism Son    Kidney cancer Neg Hx    Bladder Cancer Neg Hx     Social History: Reviewed social history from progress note on 09/26/2020. Social History   Socioeconomic History   Marital status: Single    Spouse name: Not on file   Number of children: 2   Years of education: 12 th grade, some college   Highest education level: Not on file  Occupational History   Occupation: disabled  Tobacco Use   Smoking status: Never   Smokeless tobacco: Never  Substance and Sexual Activity   Alcohol use: No   Drug use: No   Sexual activity: Yes    Partners: Male    Birth control/protection: Condom, Pill  Other Topics Concern   Not on file  Social History Narrative   Not on file   Social Determinants of Health   Financial Resource Strain: Not on file  Food Insecurity: Not on file  Transportation Needs: Not on file  Physical Activity: Not on file  Stress: Not on file  Social Connections: Not on file    Allergies:  Allergies  Allergen Reactions   Meperidine Anaphylaxis    Other reaction(s): Other (See Comments) Other Reaction: Not Assessed   Lactose     Other reaction(s): Other (See Comments) Other Reaction: GI Upset   Fludrocortisone Hives   Levofloxacin Hives   Sulfamethoxazole-Trimethoprim Other (See Comments)    Other reaction(s): Dizziness    Metabolic Disorder Labs: No results found for: "HGBA1C", "MPG" No results found for: "PROLACTIN" No results found for: "CHOL", "TRIG", "HDL", "CHOLHDL", "VLDL", "LDLCALC" Lab Results  Component Value Date   TSH 1.276 10/30/2020    Therapeutic Level Labs: No results found for: "LITHIUM" No results found for: "VALPROATE" No results found for: "CBMZ"  Current Medications: Current Outpatient Medications  Medication Sig Dispense Refill   Acidophilus Lactobacillus CAPS Take 1 tablet by mouth daily.     albuterol (VENTOLIN HFA) 108 (90 Base) MCG/ACT inhaler Inhale 1 puff into  the lungs every 6 (six) hours as needed for wheezing or shortness of breath.      busPIRone (BUSPAR) 15 MG tablet TAKE 1 TABLET(15 MG) BY MOUTH TWICE DAILY 60 tablet 2   EPINEPHrine (EPIPEN 2-PAK) 0.3 mg/0.3 mL IJ SOAJ injection Inject 0.3 mg into the muscle as needed. Reported on 02/07/2016     escitalopram (LEXAPRO) 20 MG tablet Take 1 tablet (20 mg total) by mouth daily. 30 tablet 2   fluticasone (FLONASE) 50 MCG/ACT nasal spray SHAKE LQ AND U 2 SPRAYS IEN QD     hydrOXYzine (ATARAX/VISTARIL) 25 MG tablet Take 1 tablet (25 mg total) by mouth 2 (two) times daily as needed. Severe anxiety attacks only 60 tablet 1   midodrine (PROAMATINE) 10 MG tablet Take 10 mg  by mouth daily.      ondansetron (ZOFRAN) 4 MG tablet Take 4 mg by mouth every 8 (eight) hours as needed for nausea.     ondansetron (ZOFRAN-ODT) 4 MG disintegrating tablet      promethazine (PHENERGAN) 25 MG tablet Take 25 mg by mouth every 8 (eight) hours as needed for vomiting.     Sodium Phosphates (RA ENEMA) 7-19 GM/118ML ENEM Place rectally as needed (constipation).     zolpidem (AMBIEN) 5 MG tablet Take 1 tablet (5 mg total) by mouth at bedtime as needed for sleep. 30 tablet 1   No current facility-administered medications for this visit.   Facility-Administered Medications Ordered in Other Visits  Medication Dose Route Frequency Provider Last Rate Last Admin   0.9 %  sodium chloride infusion   Intravenous Continuous Creig Hines, MD   Stopped at 05/09/21 1055   0.9 %  sodium chloride infusion   Intravenous Continuous Mauro Kaufmann, NP   Stopped at 05/11/21 1051   0.9 %  sodium chloride infusion   Intravenous Continuous Creig Hines, MD 10 mL/hr at 05/16/21 1015 New Bag at 05/16/21 1015     Musculoskeletal: Strength & Muscle Tone:  UTA Gait & Station:  Seated Patient leans: N/A  Psychiatric Specialty Exam: Review of Systems  Constitutional:  Positive for fatigue.  Psychiatric/Behavioral:  The patient is  nervous/anxious.   All other systems reviewed and are negative.   Last menstrual period 05/06/2017.There is no height or weight on file to calculate BMI.  General Appearance: Casual  Eye Contact:  Fair  Speech:  Clear and Coherent  Volume:  Normal  Mood:  Anxious coping well  Affect:  Congruent  Thought Process:  Goal Directed and Descriptions of Associations: Intact  Orientation:  Full (Time, Place, and Person)  Thought Content: Logical   Suicidal Thoughts:  No  Homicidal Thoughts:  No  Memory:  Immediate;   Fair Recent;   Fair Remote;   Fair  Judgement:  Fair  Insight:  Fair  Psychomotor Activity:  Normal  Concentration:  Concentration: Fair and Attention Span: Fair  Recall:  [employer] of Knowledge: Fair  Language: Fair  Akathisia:  No  Handed:  Right  AIMS (if indicated): not done  Assets:  Communication Skills Housing Social Support Talents/Skills Transportation  ADL's:  Intact  Cognition: WNL  Sleep:  Fair   Screenings: AIMS    Flowsheet Row Video Visit from 01/02/2022 in [facility]  AIMS Total Score 0      GAD-7    Flowsheet Row Office Visit from 10/03/2022 in [facility] Video Visit from 03/20/2022 in [facility] Video Visit from 01/02/2022 in [facility] Video Visit from 11/30/2021 in [facility] Video Visit from 10/22/2021 in [facility]  Total GAD-7 Score PHQ2-9    Flowsheet Row Office Visit from 10/03/2022 in [facility] Video Visit from 08/05/2022 in [facility] Counselor from 07/19/2022 in [facility] Video Visit from 03/20/2022 in [facility] Video Visit from 01/02/2022 in [facility]  PHQ-2 Total Score 3 6 0 0 1  PHQ-9 Total Score 8 15 -- -- --  Flowsheet Row Video Visit from 12/05/2022 in [facility] Office Visit from 10/03/2022 in [facility] Video Visit from 08/05/2022 in [facility]  C-SSRS RISK CATEGORY No Risk No Risk No Risk        Assessment and Plan: NAKIA REMMERS is a 37 year old Caucasian female on disability, single, lives at [facility], has a history of MDD, GAD, PTSD was evaluated by telemedicine today.  Patient is currently improving with regards to her mood, reports however she has fatigue, excessive sleepiness during the day, will benefit from the following plan.  Plan GAD-stable BuSpar 15 mg p.o. twice daily-reduced dosage Hydroxyzine 25 mg p.o. twice daily as needed Continues psychotherapy with new therapist-[facility] [place].  MDD in remission Lexapro 20 mg p.o. daily Continue CBT  Anorexia nervosa-improving Continue psychotherapy.  Will coordinate care with therapist.  Patient did sign an ROI.  Primary insomnia-improving Continue sleep hygiene techniques. Zolpidem 5 mg p.o. nightly as needed Reviewed [place] P[place] AWARxE  Hypersomnia-unstable Patient may benefit from referral to pulmonologist for rule out sleep apnea. However prior to that will get labs since she also reports fatigue and has a history of eating disorder.  Fatigue-unspecified-unstable Will get TSH, vitamin B12 level.  Patient may also need vitamin D and other labs.  She will also contact her primary care provider.  Labs ordered-patient to go to [facility] lab.  Follow-up in clinic in 2 to 3 months or sooner if needed.  Collaboration of Care: Collaboration of Care: Referral or follow-up with counselor/therapist AEB patient encouraged to continue follow-up with  therapist-will coordinate care.  Patient/Guardian was advised Release of Information must be obtained prior to any record release in order to collaborate their care with an outside provider. Patient/Guardian was advised if they have not already done so to contact the registration department to sign all necessary forms in order for [place] to release information regarding their care.   Consent: Patient/Guardian gives verbal consent for treatment and assignment of benefits for services provided during this visit. Patient/Guardian expressed understanding and agreed to proceed.   This note was generated in part or whole with voice recognition software. Voice recognition is usually quite accurate but there are transcription errors that can and very often do occur. I apologize for any typographical errors that were not detected and corrected.    Jomarie Longs, MD 12/06/2022, 12:32 PM

## 2022-12-17 ENCOUNTER — Other Ambulatory Visit
Admission: RE | Admit: 2022-12-17 | Discharge: 2022-12-17 | Disposition: A | Discharge status: Home / Self Care | Payer: Medicare Other | Source: Ambulatory Visit | Attending: Psychiatry | Admitting: Psychiatry

## 2022-12-17 DIAGNOSIS — R5383 Other fatigue: Secondary | ICD-10-CM

## 2022-12-17 LAB — TSH: TSH: 1.178 u[IU]/mL (ref 0.350–4.500)

## 2022-12-17 LAB — VITAMIN B12: Vitamin B-12: 219 pg/mL (ref 180–914)

## 2022-12-18 ENCOUNTER — Telehealth: Payer: Self-pay | Admitting: Psychiatry

## 2022-12-18 NOTE — Telephone Encounter (Signed)
Attempted to contact patient to discuss lab results , left VM.

## 2022-12-30 ENCOUNTER — Other Ambulatory Visit: Payer: Self-pay | Admitting: Psychiatry

## 2022-12-30 DIAGNOSIS — F411 Generalized anxiety disorder: Secondary | ICD-10-CM

## 2022-12-30 DIAGNOSIS — F3341 Major depressive disorder, recurrent, in partial remission: Secondary | ICD-10-CM

## 2023-01-01 ENCOUNTER — Other Ambulatory Visit: Payer: Self-pay | Admitting: Psychiatry

## 2023-01-01 DIAGNOSIS — F5101 Primary insomnia: Secondary | ICD-10-CM

## 2023-01-02 ENCOUNTER — Other Ambulatory Visit: Payer: Self-pay | Admitting: Psychiatry

## 2023-01-02 DIAGNOSIS — F411 Generalized anxiety disorder: Secondary | ICD-10-CM

## 2023-01-02 DIAGNOSIS — F4310 Post-traumatic stress disorder, unspecified: Secondary | ICD-10-CM

## 2023-03-04 ENCOUNTER — Encounter: Payer: Self-pay | Admitting: Psychiatry

## 2023-03-04 ENCOUNTER — Telehealth: Payer: Medicare Other | Admitting: Psychiatry

## 2023-03-04 DIAGNOSIS — F4310 Post-traumatic stress disorder, unspecified: Secondary | ICD-10-CM | POA: Diagnosis not present

## 2023-03-04 DIAGNOSIS — F3342 Major depressive disorder, recurrent, in full remission: Secondary | ICD-10-CM

## 2023-03-04 DIAGNOSIS — F411 Generalized anxiety disorder: Secondary | ICD-10-CM

## 2023-03-04 DIAGNOSIS — F5000 Anorexia nervosa, unspecified: Secondary | ICD-10-CM | POA: Diagnosis not present

## 2023-03-04 DIAGNOSIS — F5101 Primary insomnia: Secondary | ICD-10-CM

## 2023-03-04 MED ORDER — OXCARBAZEPINE 150 MG PO TABS: 150.0000 mg | ORAL_TABLET | Freq: Two times a day (BID) | ORAL | 1 refills | Status: DC

## 2023-03-04 MED ORDER — ZOLPIDEM TARTRATE 5 MG PO TABS: 5.0000 mg | ORAL_TABLET | Freq: Every evening | ORAL | 1 refills | Status: DC | PRN

## 2023-03-04 MED ORDER — HYDROXYZINE HCL 25 MG PO TABS: 25.0000 mg | ORAL_TABLET | Freq: Two times a day (BID) | ORAL | 1 refills | Status: DC | PRN

## 2023-03-04 NOTE — Patient Instructions (Signed)
Oxcarbazepine Tablets What is this medication? OXCARBAZEPINE (ox car BAZ e peen) prevents and controls seizures in people with epilepsy. It works by calming overactive nerves in your body. This medicine may be used for other purposes; ask your health care provider or pharmacist if you have questions. COMMON BRAND NAME(S): Trileptal What should I tell my care team before I take this medication? They need to know if you have any of these conditions: Asian ancestry Kidney disease Liver disease Suicidal thoughts, plans, or attempt by you or a family member Any unusual or allergic reaction to oxcarbazepine, carbamazepine, other medications, foods, dyes, or preservatives Pregnant or trying to get pregnant Breast-feeding How should I use this medication? Take this medication by mouth with a glass of water. Follow the directions on the prescription label. This medication may be taken with or without food. Take your doses at regular intervals. Do not take your medication more often than directed. Do not stop taking except on the advice of your care team. A special MedGuide will be given to you by the pharmacist with each prescription and refill. Be sure to read this information carefully each time. Talk to your care team about the use of this medication in children. While this medication may be prescribed for children as young as 2 years for selected conditions, precautions do apply. Overdosage: If you think you have taken too much of this medicine contact a poison control center or emergency room at once. NOTE: This medicine is only for you. Do not share this medicine with others. What if I miss a dose? If you miss a dose, take it as soon as you can. If it is almost time for your next dose, take only that dose. Do not take double or extra doses. What may interact with this medication? Do not take this medication with any of the following: Carbamazepine This medication may also interact with the  following: Certain medications for seizures like phenobarbital, phenytoin, valproic acid Certain medications for high blood pressure like felodipine, diltiazem, verapamil Cyclosporine Estrogen or progestin hormones This list may not describe all possible interactions. Give your health care provider a list of all the medicines, herbs, non-prescription drugs, or dietary supplements you use. Also tell them if you smoke, drink alcohol, or use illegal drugs. Some items may interact with your medicine. What should I watch for while using this medication? Visit your care team for regular checks on your progress. Do not stop taking this medication suddenly. This increases the risk of seizures. Wear a Arboriculturist or necklace. Carry an identification card with information about your condition, medications, and care team. This medication may cause serious skin reactions. They can happen weeks to months after starting the medication. Contact your care team right away if you notice fevers or flu-like symptoms with a rash. The rash may be red or purple and then turn into blisters or peeling of the skin. Or, you might notice a red rash with swelling of the face, lips or lymph nodes in your neck or under your arms. Rarely, serious skin allergic reactions may occur with this medication. If you develop a skin rash, redness, itching, peeling skin inside your mouth, swollen glands, or a fever while taking this medication, contact your care team immediately. You may get drowsy or dizzy. Do not drive, use machinery, or do anything that needs mental alertness until you know how this medication affects you. Do not stand or sit up quickly, especially if you are an older  patient. This reduces the risk of dizzy or fainting spells. Alcohol can make you more drowsy and dizzy. Avoid alcoholic drinks. Estrogen and/or progestin may not work as well while you are taking this medication. If you are taking this medication for  contraception, talk to your care team about using a second type of contraception. A barrier contraceptive, such as a condom or diaphragm, is recommended. The use of this medication may increase the chance of suicidal thoughts or actions. Pay special attention to how you are responding while on this medication. Any worsening of mood, or thoughts of suicide or dying should be reported to your care team right away. Women who become pregnant while using this medication may enroll in the [place] American Antiepileptic Drug Pregnancy Registry by calling 902-814-8525. This registry collects information about the safety of antiepileptic medication use during pregnancy. What side effects may I notice from receiving this medication? Side effects that you should report to your care team as soon as possible: Allergic reactions--skin rash, itching, hives, swelling of the face, lips, tongue, or throat Infection--fever, chills, cough, or sore throat Low sodium level--muscle weakness, fatigue, dizziness, headache, confusion Rash, fever, and swollen lymph nodes Redness, blistering, peeling, or loosening of the skin, including inside the mouth Seizures Thoughts of suicide or self-harm, worsening mood, or feelings of depression Side effects that usually do not require medical attention (report to your care team if they continue or are bothersome): Difficulty with paying attention, memory, or speech Dizziness Drowsiness Double vision Headache Loss of balance or coordination Nausea Slow or sluggish movements of the body This list may not describe all possible side effects. Call your doctor for medical advice about side effects. You may report side effects to FDA at 1-800-FDA-1088. Where should I keep my medication? Keep out of reach of children and pets. Store at room temperature between 15 and 30 degrees C (59 and 86 degrees F). Keep container tightly closed. Throw away any unused medication after the expiration  date. NOTE: This sheet is a summary. It may not cover all possible information. If you have questions about this medicine, talk to your doctor, pharmacist, or health care provider.  2024 Elsevier/Gold Standard (2021-04-16 00:00:00)

## 2023-03-04 NOTE — Progress Notes (Unsigned)
Virtual Visit via Video Note  I connected with Latasha [name] on 03/04/23 at 11:00 AM EDT by a video enabled telemedicine application and verified that I am speaking with the correct person using two identifiers.  Location Provider Location : [facility] Patient Location : Home  Participants: Patient , Provider    I discussed the limitations of evaluation and management by telemedicine and the availability of in person appointments. The patient expressed understanding and agreed to proceed.    I discussed the assessment and treatment plan with the patient. The patient was provided an opportunity to ask questions and all were answered. The patient agreed with the plan and demonstrated an understanding of the instructions.   The patient was advised to call back or seek an in-person evaluation if the symptoms worsen or if the condition fails to improve as anticipated.  Video connection was lost at less than 50% of the duration of the visit, at which time the remainder of the visit was completed through audio only     [facility] MD OP Progress Note  03/05/2023 7:54 PM Latasha [name]  MRN:  [id number]  Chief Complaint:  Chief Complaint  Patient presents with   Follow-up   Depression   Anxiety   HPI: Latasha [name] is a 37 year old Caucasian female on SSD, lives in [place], has a history of GAD, MDD, PTSD, anorexia nervosa, primary insomnia, Hirschsprung's disease, iron deficiency anemia, chronic constipation, chronic fatigue syndrome was evaluated by telemedicine today.  Patient reports that she has had some struggles with her eating disorder, anorexia in the past several weeks.  She continues to have problems with restricting food , obsessive thoughts about her weight, feeling guilty after eating.  Patient reports she is currently working with a nutritionist and that has helped.  She has seen some improvement.  She also has a therapist and they meet on a weekly basis.  That also seems to  be helpful.  Patient reports she currently takes 2 protein bars in the morning, 2 protein bars at lunch, each bar around 200 cal, and then has a full meal at dinnertime.  Denies any binging, purging, using laxatives.  Patient also reports recent worsening of intrusive memories, reports she had a break-up with her boyfriend and that may have triggered it.  She did have cutting behavior soon after which she reports was to release her emotions however denies having any suicidality.  She is currently undergoing EMDR with her therapist Mr. Lenard Galloway, reports she would like to give it more time.  Currently compliant on medications like Lexapro and BuSpar.  She did not tolerate the higher dosage of BuSpar.  Not interested in changing Lexapro to another SSRI or SNRI at this time.    Does report anxiety symptoms, worrying about things, some nervousness.  Agreeable to trial of a mood stabilizer like Trileptal.  Patient also with recent vitamin B12 deficiency, agreeable to reach out to her primary care provider.  Patient with anorexia may need labs like iron panel, vitamin D and other labs, she will discuss with primary care.  Patient denies any current suicidality, homicidality or perceptual disturbances.  Patient denies any other concerns today.  Visit Diagnosis:    ICD-10-CM   1. GAD (generalized anxiety disorder)  F41.1 hydrOXYzine (ATARAX) 25 MG tablet    OXcarbazepine (TRILEPTAL) 150 MG tablet    2. MDD (major depressive disorder), recurrent, in full remission (HCC)  F33.42     3. PTSD (post-traumatic stress disorder)  F43.10 OXcarbazepine (TRILEPTAL) 150 MG tablet    4. Anorexia nervosa  F50.00     5. Primary insomnia  F51.01 zolpidem (AMBIEN) 5 MG tablet      Past Psychiatric History: I have reviewed past psychiatric history from progress note on 09/26/2020.  Past trials of Seroquel, Wellbutrin, BuSpar, Prozac, Paxil, Xanax, Effexor, Lexapro, Zoloft, Cymbalta.  Past Medical History:   Past Medical History:  Diagnosis Date   Acid reflux    Anemia    Anxiety    Asthma    Chronic constipation    Hirschsprung disease    Pelvic floor dysfunction    Syncope, cardiogenic     Past Surgical History:  Procedure Laterality Date   abdomial adhesions removed  08/19/2010   ABDOMINAL HYSTERECTOMY     APPENDECTOMY  08/20/1995   bladder repair surgery  08/20/1995   c- sections  [id number]   enodmetiosis removed  08/19/2010   OVARIAN CYST REMOVAL  2003, 2012   TONSILLECTOMY  08/19/2005    Family Psychiatric History: Reviewed family psychiatric history from my progress note on 09/26/2020.  Family History:  Family History  Problem Relation Age of Onset   Cancer Mother    Diabetes Mother    Hypertension Mother    Cancer Father    Alcohol abuse Father    Depression Father    Cancer Maternal Grandfather    Factor IX deficiency Maternal Grandfather    Factor IX deficiency Son    ADD / ADHD Son    Autism Son    Kidney cancer Neg Hx    Bladder Cancer Neg Hx     Social History: Reviewed social history from my progress note on 09/26/2020. Social History   Socioeconomic History   Marital status: Single    Spouse name: Not on file   Number of children: 2   Years of education: 12 th grade, some college   Highest education level: Not on file  Occupational History   Occupation: disabled  Tobacco Use   Smoking status: Never   Smokeless tobacco: Never  Substance and Sexual Activity   Alcohol use: No   Drug use: No   Sexual activity: Yes    Partners: Male    Birth control/protection: Condom, Pill  Other Topics Concern   Not on file  Social History Narrative   Not on file   Social Determinants of Health   Financial Resource Strain: Not on file  Food Insecurity: Not on file  Transportation Needs: Not on file  Physical Activity: Not on file  Stress: Not on file  Social Connections: Not on file    Allergies:  Allergies  Allergen Reactions   Meperidine  Anaphylaxis    Other reaction(s): Other (See Comments) Other Reaction: Not Assessed   Lactose     Other reaction(s): Other (See Comments) Other Reaction: GI Upset   Fludrocortisone Hives   Levofloxacin Hives   Sulfamethoxazole-Trimethoprim Other (See Comments)    Other reaction(s): Dizziness    Metabolic Disorder Labs: No results found for: "HGBA1C", "MPG" No results found for: "PROLACTIN" No results found for: "CHOL", "TRIG", "HDL", "CHOLHDL", "VLDL", "LDLCALC" Lab Results  Component Value Date   TSH 1.178 12/17/2022   TSH 1.276 10/30/2020    Therapeutic Level Labs: No results found for: "LITHIUM" No results found for: "VALPROATE" No results found for: "CBMZ"  Current Medications: Current Outpatient Medications  Medication Sig Dispense Refill   OXcarbazepine (TRILEPTAL) 150 MG tablet Take 1 tablet (150 mg total) by mouth 2 (  two) times daily. 60 tablet 1   Acidophilus Lactobacillus CAPS Take 1 tablet by mouth daily.     albuterol (VENTOLIN HFA) 108 (90 Base) MCG/ACT inhaler Inhale 1 puff into the lungs every 6 (six) hours as needed for wheezing or shortness of breath.      busPIRone (BUSPAR) 15 MG tablet TAKE 1 TABLET(15 MG) BY MOUTH TWICE DAILY 60 tablet 2   EPINEPHrine (EPIPEN 2-PAK) 0.3 mg/0.3 mL IJ SOAJ injection Inject 0.3 mg into the muscle as needed. Reported on 02/07/2016     escitalopram (LEXAPRO) 20 MG tablet TAKE 1 TABLET(20 MG) BY MOUTH DAILY 30 tablet 2   fluticasone (FLONASE) 50 MCG/ACT nasal spray SHAKE LQ AND U 2 SPRAYS IEN QD     hydrOXYzine (ATARAX) 25 MG tablet Take 1 tablet (25 mg total) by mouth 2 (two) times daily as needed. Severe anxiety attacks only 60 tablet 1   midodrine (PROAMATINE) 10 MG tablet Take 10 mg by mouth daily.      ondansetron (ZOFRAN) 4 MG tablet Take 4 mg by mouth every 8 (eight) hours as needed for nausea.     ondansetron (ZOFRAN-ODT) 4 MG disintegrating tablet      promethazine (PHENERGAN) 25 MG tablet Take 25 mg by mouth every 8  (eight) hours as needed for vomiting.     Sodium Phosphates (RA ENEMA) 7-19 GM/118ML ENEM Place rectally as needed (constipation).     zolpidem (AMBIEN) 5 MG tablet Take 1 tablet (5 mg total) by mouth at bedtime as needed for sleep. 30 tablet 1   No current facility-administered medications for this visit.   Facility-Administered Medications Ordered in Other Visits  Medication Dose Route Frequency Provider Last Rate Last Admin   0.9 %  sodium chloride infusion   Intravenous Continuous Creig Hines, MD   Stopped at 05/09/21 1055   0.9 %  sodium chloride infusion   Intravenous Continuous Mauro Kaufmann, NP   Stopped at 05/11/21 1051   0.9 %  sodium chloride infusion   Intravenous Continuous Creig Hines, MD 10 mL/hr at 05/16/21 1015 New Bag at 05/16/21 1015     Musculoskeletal: Strength & Muscle Tone:  UTA Gait & Station:  Seated Patient leans: N/A  Psychiatric Specialty Exam: Review of Systems  Constitutional:  Positive for appetite change.  Psychiatric/Behavioral:  Positive for sleep disturbance. The patient is nervous/anxious.     Last menstrual period 05/06/2017.There is no height or weight on file to calculate BMI.  General Appearance: Fairly Groomed  Eye Contact:  Fair  Speech:  Clear and Coherent  Volume:  Normal  Mood:  Anxious  Affect:  Congruent  Thought Process:  Goal Directed and Descriptions of Associations: Intact  Orientation:  Full (Time, Place, and Person)  Thought Content: Rumination   Suicidal Thoughts:  No  Homicidal Thoughts:  No  Memory:  Immediate;   Fair Recent;   Fair Remote;   Fair  Judgement:  Fair  Insight:  Fair  Psychomotor Activity:  Normal  Concentration:  Concentration: Fair and Attention Span: Fair  Recall:  [employer] of Knowledge: Fair  Language: Fair  Akathisia:  No  Handed:  Right  AIMS (if indicated): not done  Assets:  Communication Skills Desire for Improvement Housing Social Support Transportation  ADL's:  Intact   Cognition: WNL  Sleep:   Restless at times   Screenings: AIMS    Flowsheet Row Video Visit from 01/02/2022 in [facility]  AIMS  Total Score 0      GAD-7    Flowsheet Row Video Visit from 03/04/2023 in [facility] Office Visit from 10/03/2022 in [facility] Video Visit from 03/20/2022 in [facility] Video Visit from 01/02/2022 in [facility] Video Visit from 11/30/2021 in [facility]  Total GAD-7 Score 6 9 1 4 8       PHQ2-9    Flowsheet Row Video Visit from 03/04/2023 in [facility] Office Visit from 10/03/2022 in [facility] Video Visit from 08/05/2022 in [facility] Counselor from 07/19/2022 in [facility] Video Visit from 03/20/2022 in [facility]  PHQ-2 Total Score 0 3 6 0 0  PHQ-9 Total Score -- 8 15 -- --      Flowsheet Row Video Visit from 03/04/2023 in [facility] Video Visit from 12/05/2022 in [facility] Office Visit from 10/03/2022 in [facility]  C-SSRS RISK CATEGORY No Risk No Risk No Risk        Assessment and Plan: Latasha [name] is a 37 year old Caucasian female, with a history of MDD, GAD, PTSD, anorexia nervosa, currently struggling with anxiety, recent struggles with her eating disorder, psychosocial stressors of going through a break-up with recent self-injurious behavior of cutting, currently undergoing EMDR as well as working with a nutritionist, will benefit from further medication management, ordering of  labs to address any nutritional deficiencies, will benefit from the following plan.  Plan GAD-unstable BuSpar 15 mg p.o. twice daily-reduced dosage. Hydroxyzine 25 mg p.o. twice daily as needed Start Trileptal 150 mg p.o. twice daily Continue psychotherapy with Mr. Lenard Galloway, [place].  MDD in remission Lexapro 20 mg p.o. daily Continue CBT.  Anorexia nervosa-some improvement Discussed referral for residential treatment.  Patient however is currently working with a nutritionist and her individual therapist and is aware that she needs to get more intensive treatment if she does not make any progress. Patient to sign an ROI to obtain medical records from therapist.  Primary insomnia-unstable Please start zolpidem 5 mg p.o. nightly as needed Continue sleep hygiene techniques Reviewed [place] PMP AWARxE Will consider referral for sleep study in the future as needed.  PTSD-improving Continue psychotherapy with therapist Continue Lexapro 20 mg p.o. daily   Reviewed and discussed labs-TSH dated 12/17/2022-within normal limits, vitamin B12-low at 219.  Patient will need vitamin B12 replacement.  She agrees to contact primary care provider for the same as well as for further testing as needed due to her current eating/nutritional struggles.  Follow-up in clinic in 4 weeks or sooner in person.  Collaboration of Care: Collaboration of Care: Referral or follow-up with counselor/therapist AEB encouraged continued CBT.  I have advised patient to sign an ROI to request medical records from therapist.  Patient/Guardian was advised Release of Information must be obtained prior to any record release in order to collaborate their care with an outside provider. Patient/Guardian was advised if they have not already done so to contact the registration department to sign all necessary forms in order for [place] to release information regarding their care.   Consent: Patient/Guardian gives verbal consent for  treatment and assignment of benefits for services provided during this visit. Patient/Guardian expressed understanding and agreed to  proceed.    I have spent at least 22 minutes non face to face with patient today .  This note was generated in part or whole with voice recognition software. Voice recognition is usually quite accurate but there are transcription errors that can and very often do occur. I apologize for any typographical errors that were not detected and corrected.     Jomarie Longs, MD 03/05/2023, 7:54 PM

## 2023-03-27 ENCOUNTER — Other Ambulatory Visit: Payer: Self-pay | Admitting: Psychiatry

## 2023-03-27 DIAGNOSIS — F3341 Major depressive disorder, recurrent, in partial remission: Secondary | ICD-10-CM

## 2023-03-27 DIAGNOSIS — F4310 Post-traumatic stress disorder, unspecified: Secondary | ICD-10-CM

## 2023-03-27 DIAGNOSIS — F411 Generalized anxiety disorder: Secondary | ICD-10-CM

## 2023-04-03 ENCOUNTER — Encounter: Payer: Self-pay | Admitting: Psychiatry

## 2023-04-03 ENCOUNTER — Ambulatory Visit (INDEPENDENT_AMBULATORY_CARE_PROVIDER_SITE_OTHER): Payer: Medicare Other | Admitting: Psychiatry

## 2023-04-03 VITALS — BP 119/73 | HR 62 | Temp 99.0°F | Ht 65.0 in | Wt 124.6 lb

## 2023-04-03 DIAGNOSIS — F5000 Anorexia nervosa, unspecified: Secondary | ICD-10-CM

## 2023-04-03 DIAGNOSIS — F411 Generalized anxiety disorder: Secondary | ICD-10-CM

## 2023-04-03 DIAGNOSIS — F5101 Primary insomnia: Secondary | ICD-10-CM

## 2023-04-03 DIAGNOSIS — F4310 Post-traumatic stress disorder, unspecified: Secondary | ICD-10-CM | POA: Diagnosis not present

## 2023-04-03 DIAGNOSIS — F3342 Major depressive disorder, recurrent, in full remission: Secondary | ICD-10-CM | POA: Diagnosis not present

## 2023-04-03 NOTE — Progress Notes (Signed)
BH MD OP Progress Note  04/03/2023 3:29 PM JODETTE BRANDWEIN  MRN:  [id number]  Chief Complaint:  Chief Complaint  Patient presents with   Follow-up   Anxiety   Depression   Eating Disorder   Medication Refill   HPI: Latasha [name] is a 37 year old Caucasian female on SSD, lives in [place], has a history of GAD, MDD, PTSD, anorexia nervosa, primary insomnia, Hirschsprung's disease, iron deficiency anemia, chronic constipation, chronic fatigue syndrome was evaluated in office today.  Patient today reports she is currently doing fairly well.  Reports anxiety is more manageable.  She follows up with her therapist and is currently in EMDR which has been beneficial.  She has therapy sessions every 2 weeks now.  She reports she has been able to make use of coping strategies like going for a walk, sitting with her uncomfortable emotions, making use of 5 senses and so on.  She reports she is taking the Trileptal which was added last visit.  She does have nausea.  She has been talking to her dietitian who is currently working with her on her diet plan.  She reports she is not sure if the nausea is due to refeeding syndrome or due to the medication.  She does take Zofran as needed for the nausea which helps to some extent.  Patient reports she has gained a few pounds and her eating habits are better.  Patient reports sleep is overall good on the zolpidem.  Uses it only as needed.  Denies side effects.  Patient denies any suicidality, homicidality or perceptual disturbances.  Patient denies any other concerns today.  Visit Diagnosis:    ICD-10-CM   1. GAD (generalized anxiety disorder)  F41.1     2. MDD (major depressive disorder), recurrent, in full remission (HCC)  F33.42     3. PTSD (post-traumatic stress disorder)  F43.10     4. Anorexia nervosa  F50.00     5. Primary insomnia  F51.01       Past Psychiatric History: I had past psychiatric history from progress note on 09/26/2020.   Past trials of Seroquel, Wellbutrin, BuSpar, Prozac, Paxil, Xanax, Effexor, Lexapro, Zoloft, Cymbalta.  Past Medical History:  Past Medical History:  Diagnosis Date   Acid reflux    Anemia    Anxiety    Asthma    Chronic constipation    Hirschsprung disease    Pelvic floor dysfunction    Syncope, cardiogenic     Past Surgical History:  Procedure Laterality Date   abdomial adhesions removed  08/19/2010   ABDOMINAL HYSTERECTOMY     APPENDECTOMY  08/20/1995   bladder repair surgery  08/20/1995   c- sections  4098,1191   enodmetiosis removed  08/19/2010   OVARIAN CYST REMOVAL  2003, 2012   TONSILLECTOMY  08/19/2005    Family Psychiatric History: I have reviewed family psychiatric history from progress note on 09/26/2020.  Family History:  Family History  Problem Relation Age of Onset   Cancer Mother    Diabetes Mother    Hypertension Mother    Cancer Father    Alcohol abuse Father    Depression Father    Cancer Maternal Grandfather    Factor IX deficiency Maternal Grandfather    Factor IX deficiency Son    ADD / ADHD Son    Autism Son    Kidney cancer Neg Hx    Bladder Cancer Neg Hx     Social History: I have reviewed social  history from progress note on 09/26/2020. Social History   Socioeconomic History   Marital status: Single    Spouse name: Not on file   Number of children: 2   Years of education: 12 th grade, some college   Highest education level: Not on file  Occupational History   Occupation: disabled  Tobacco Use   Smoking status: Never   Smokeless tobacco: Never  Substance and Sexual Activity   Alcohol use: No   Drug use: No   Sexual activity: Yes    Partners: Male    Birth control/protection: Condom, Pill  Other Topics Concern   Not on file  Social History Narrative   Not on file   Social Determinants of Health   Financial Resource Strain: Not on file  Food Insecurity: Not on file  Transportation Needs: Not on file  Physical Activity: Not  on file  Stress: Not on file  Social Connections: Not on file    Allergies:  Allergies  Allergen Reactions   Meperidine Anaphylaxis    Other reaction(s): Other (See Comments) Other Reaction: Not Assessed   Lactose     Other reaction(s): Other (See Comments) Other Reaction: GI Upset   Fludrocortisone Hives   Levofloxacin Hives   Sulfamethoxazole-Trimethoprim Other (See Comments)    Other reaction(s): Dizziness    Metabolic Disorder Labs: No results found for: "HGBA1C", "MPG" No results found for: "PROLACTIN" No results found for: "CHOL", "TRIG", "HDL", "CHOLHDL", "VLDL", "LDLCALC" Lab Results  Component Value Date   TSH 1.178 12/17/2022   TSH 1.276 10/30/2020    Therapeutic Level Labs: No results found for: "LITHIUM" No results found for: "VALPROATE" No results found for: "CBMZ"  Current Medications: Current Outpatient Medications  Medication Sig Dispense Refill   Acidophilus Lactobacillus CAPS Take 1 tablet by mouth daily.     albuterol (VENTOLIN HFA) 108 (90 Base) MCG/ACT inhaler Inhale 1 puff into the lungs every 6 (six) hours as needed for wheezing or shortness of breath.      busPIRone (BUSPAR) 15 MG tablet TAKE 1 TABLET(15 MG) BY MOUTH TWICE DAILY 60 tablet 2   EPINEPHrine (EPIPEN 2-PAK) 0.3 mg/0.3 mL IJ SOAJ injection Inject 0.3 mg into the muscle as needed. Reported on 02/07/2016     escitalopram (LEXAPRO) 20 MG tablet TAKE 1 TABLET(20 MG) BY MOUTH DAILY 30 tablet 2   fluticasone (FLONASE) 50 MCG/ACT nasal spray SHAKE LQ AND U 2 SPRAYS IEN QD     hydrOXYzine (ATARAX) 25 MG tablet Take 1 tablet (25 mg total) by mouth 2 (two) times daily as needed. Severe anxiety attacks only 60 tablet 1   midodrine (PROAMATINE) 10 MG tablet Take 10 mg by mouth daily.      ondansetron (ZOFRAN) 4 MG tablet Take 4 mg by mouth every 8 (eight) hours as needed for nausea.     OXcarbazepine (TRILEPTAL) 150 MG tablet Take 1 tablet (150 mg total) by mouth 2 (two) times daily. 60 tablet 1    promethazine (PHENERGAN) 25 MG tablet Take 25 mg by mouth every 8 (eight) hours as needed for vomiting.     zolpidem (AMBIEN) 5 MG tablet Take 1 tablet (5 mg total) by mouth at bedtime as needed for sleep. 30 tablet 1   Sodium Phosphates (RA ENEMA) 7-19 GM/118ML ENEM Place rectally as needed (constipation). (Patient not taking: Reported on 04/03/2023)     No current facility-administered medications for this visit.   Facility-Administered Medications Ordered in Other Visits  Medication Dose Route Frequency Provider  Last Rate Last Admin   0.9 %  sodium chloride infusion   Intravenous Continuous Creig Hines, MD   Stopped at 05/09/21 1055   0.9 %  sodium chloride infusion   Intravenous Continuous Mauro Kaufmann, NP   Stopped at 05/11/21 1051   0.9 %  sodium chloride infusion   Intravenous Continuous Creig Hines, MD 10 mL/hr at 05/16/21 1015 New Bag at 05/16/21 1015     Musculoskeletal: Strength & Muscle Tone: within normal limits Gait & Station: normal Patient leans: N/A  Psychiatric Specialty Exam: Review of Systems  Gastrointestinal:  Positive for nausea.  Psychiatric/Behavioral: Negative.      Blood pressure 119/73, pulse 62, temperature 99 F (37.2 C), temperature source Skin, height 5\' 5"  (1.651 m), weight 124 lb 9.6 oz (56.5 kg), last menstrual period 05/06/2017.Body mass index is 20.73 kg/m.  General Appearance: Casual  Eye Contact:  Fair  Speech:  Clear and Coherent  Volume:  Normal  Mood:  Euthymic  Affect:  Full Range  Thought Process:  Goal Directed and Descriptions of Associations: Intact  Orientation:  Full (Time, Place, and Person)  Thought Content: Logical   Suicidal Thoughts:  No  Homicidal Thoughts:  No  Memory:  Immediate;   Fair Recent;   Fair Remote;   Fair  Judgement:  Good  Insight:  Good  Psychomotor Activity:  Normal  Concentration:  Concentration: Fair and Attention Span: Fair  Recall:  [employer] of Knowledge: Fair  Language: Fair   Akathisia:  No  Handed:  Right  AIMS (if indicated): not done  Assets:  Communication Skills Desire for Improvement Housing Social Support  ADL's:  Intact  Cognition: WNL  Sleep:  Fair   Screenings: AIMS    Flowsheet Row Video Visit from 01/02/2022 in [facility]  AIMS Total Score 0      GAD-7    Flowsheet Row Office Visit from 04/03/2023 in [facility] Video Visit from 03/04/2023 in [facility] Office Visit from 10/03/2022 in [facility] Video Visit from 03/20/2022 in [facility] Video Visit from 01/02/2022 in [facility]  Total GAD-7 Score 5 6 9 1 4       [id number]    Flowsheet Row Office Visit from 04/03/2023 in [facility] Video Visit from 03/04/2023 in [facility] Office Visit from 10/03/2022 in [facility] Video Visit from 08/05/2022 in [facility] Counselor from 07/19/2022 in [facility] Total Score 2 0 3 6 0  PHQ-9 Total Score 5 -- 8 15 --      Flowsheet Row Office Visit from 04/03/2023 in [facility] Video Visit from 03/04/2023 in [facility] Video Visit from 12/05/2022 in [facility]  C-SSRS RISK CATEGORY No Risk No Risk No Risk        Assessment and Plan: AMELIAMAE HECKEL is a 37 year old Caucasian female who has a history of MDD, GAD, PTSD, anorexia nervosa, currently improving with regards to her mood symptoms as well eating habits although does report nausea, discussed plan as noted  below.  Plan GAD-improving BuSpar 15 mg p.o. twice daily-reduced dosage Hydroxyzine 25 mg p.o. twice daily as needed Patient advised  to take the Trileptal 150 mg p.o. once a day for the next 3 doses and stop taking it to see if nausea gets better.  If nausea is not related to the medication she could go back on the Trileptal 150 mg p.o. twice daily Continue psychotherapy with Mr. Lenard Galloway, [facility] [place]  MDD in remission Lexapro 20 mg p.o. daily Continue CBT  Anorexia nervosa-improving Patient to continue to work with dietitian.   PTSD-improving Continue EMDR with therapist Mr. Lutricia Horsfall Continue Lexapro 20 mg p.o. daily Zolpidem 5 mg p.o. nightly as needed    Collaboration of Care: Collaboration of Care: Referral or follow-up with counselor/therapist AEB patient to continue CBT  Patient/Guardian was advised Release of Information must be obtained prior to any record release in order to collaborate their care with an outside provider. Patient/Guardian was advised if they have not already done so to contact the registration department to sign all necessary forms in order for [place] to release information regarding their care.   Consent: Patient/Guardian gives verbal consent for treatment and assignment of benefits for services provided during this visit. Patient/Guardian expressed understanding and agreed to proceed.   Follow-up in clinic in 3 to 4 months or sooner if needed.  This note was generated in part or whole with voice recognition software. Voice recognition is usually quite accurate but there are transcription errors that can and very often do occur. I apologize for any typographical errors that were not detected and corrected.    Jomarie Longs, MD 04/03/2023, 6:39 PM

## 2023-06-07 ENCOUNTER — Other Ambulatory Visit: Payer: Self-pay | Admitting: Psychiatry

## 2023-06-07 DIAGNOSIS — F5101 Primary insomnia: Secondary | ICD-10-CM

## 2023-06-09 ENCOUNTER — Other Ambulatory Visit: Payer: Self-pay | Admitting: Psychiatry

## 2023-06-09 DIAGNOSIS — F5101 Primary insomnia: Secondary | ICD-10-CM

## 2023-06-30 ENCOUNTER — Telehealth: Payer: Medicare Other | Admitting: Psychiatry

## 2023-07-05 ENCOUNTER — Other Ambulatory Visit: Payer: Self-pay | Admitting: Psychiatry

## 2023-07-05 DIAGNOSIS — F4310 Post-traumatic stress disorder, unspecified: Secondary | ICD-10-CM

## 2023-07-05 DIAGNOSIS — F411 Generalized anxiety disorder: Secondary | ICD-10-CM

## 2023-07-07 ENCOUNTER — Other Ambulatory Visit: Payer: Self-pay | Admitting: Psychiatry

## 2023-07-07 DIAGNOSIS — F411 Generalized anxiety disorder: Secondary | ICD-10-CM

## 2023-07-07 DIAGNOSIS — F3341 Major depressive disorder, recurrent, in partial remission: Secondary | ICD-10-CM

## 2023-08-19 ENCOUNTER — Encounter: Payer: Self-pay | Admitting: Psychiatry

## 2023-08-19 ENCOUNTER — Ambulatory Visit (INDEPENDENT_AMBULATORY_CARE_PROVIDER_SITE_OTHER): Payer: Medicare Other | Admitting: Psychiatry

## 2023-08-19 VITALS — BP 123/80 | HR 54 | Temp 97.8°F | Ht 65.0 in | Wt 127.2 lb

## 2023-08-19 DIAGNOSIS — F5000 Anorexia nervosa, unspecified: Secondary | ICD-10-CM | POA: Diagnosis not present

## 2023-08-19 DIAGNOSIS — F3342 Major depressive disorder, recurrent, in full remission: Secondary | ICD-10-CM

## 2023-08-19 DIAGNOSIS — F4310 Post-traumatic stress disorder, unspecified: Secondary | ICD-10-CM

## 2023-08-19 DIAGNOSIS — F411 Generalized anxiety disorder: Secondary | ICD-10-CM | POA: Diagnosis not present

## 2023-08-19 DIAGNOSIS — F5101 Primary insomnia: Secondary | ICD-10-CM

## 2023-08-19 NOTE — Progress Notes (Signed)
 [facility] MD OP Progress Note  08/19/2023 2:01 PM Latasha [name]  MRN:  [id number]  Chief Complaint:  Chief Complaint  Patient presents with   Follow-up   Anxiety   Anorexia   Depression   Medication Refill   HPI: Latasha [name] is a 37 year old Caucasian female on SSD, lives in [place] has a history of GAD, MDD, PTSD, anorexia nervosa, primary insomnia, Hirschsprung's disease, iron  deficiency anemia, chronic constipation, chronic fatigue syndrome was evaluated in office today.  The patient has been diagnosed with generalized anxiety disorder, major depression (in full remission), PTSD, anorexia, and primary insomnia. She reports some anxiety, primarily related to weight gain and early darkness since its winter, but denies any depressive symptoms. She is currently undergoing EMDR therapy on a weekly basis, which she finds helpful, though challenging.  The patient has been working with a nutritionist to address weight gain difficulties. She reports struggling to increase her weight, despite maintaining a diet plan that includes nutritional supplements like Boost and Clif Bars, along with balanced meals. The patient admits to occasional food or calorie restriction, about three to four times a week, and exercising when possible. However, she also reports frequent episodes of syncope, with heart rate dropping into the 40s and 30s, which has led to a reduction in her physical activity.  In terms of medication, the patient is on Buspar  15 mg twice daily, Lexapro  20 mg, and hydroxyzine  25 mg as needed. She reports infrequent use of hydroxyzine , about four times a month. She also takes midodrine three times a day for her low heart rate. She has discontinued Trileptal  due to side effects.  The patient's sleep has improved, with occasional night terrors.  She continues to use Ambien  as needed, denies side effects.  Denies any suicidality, homicidality or perceptual disturbances.  She has been  advised to monitor her heart rate and keep a journal by her cardiologist, whom she last saw in November. She is due for a follow-up in March. The patient's B12 levels were found to be low in September, and she has been advised to take B12 and folate supplements.  Visit Diagnosis:    ICD-10-CM   1. GAD (generalized anxiety disorder)  F41.1     2. MDD (major depressive disorder), recurrent, in full remission (HCC)  F33.42     3. PTSD (post-traumatic stress disorder)  F43.10     4. Anorexia nervosa  F50.00     5. Primary insomnia  F51.01       Past Psychiatric History: I have reviewed past psychiatric history from progress note on 09/26/2020.  Past trials of Seroquel , Wellbutrin, BuSpar , Prozac, Paxil, Xanax, Effexor, Lexapro , Zoloft , Cymbalta.  Past Medical History:  Past Medical History:  Diagnosis Date   Acid reflux    Anemia    Anxiety    Asthma    Chronic constipation    Hirschsprung disease    Pelvic floor dysfunction    Syncope, cardiogenic     Past Surgical History:  Procedure Laterality Date   abdomial adhesions removed  08/19/2010   ABDOMINAL HYSTERECTOMY     APPENDECTOMY  08/20/1995   bladder repair surgery  08/20/1995   c- sections  7986,7985   enodmetiosis removed  08/19/2010   OVARIAN CYST REMOVAL  2003, 2012   TONSILLECTOMY  08/19/2005    Family Psychiatric History: I have reviewed family psychiatric history from progress note on 09/26/2020.  Family History:  Family History  Problem Relation Age of Onset  Cancer Mother    Diabetes Mother    Hypertension Mother    Cancer Father    Alcohol abuse Father    Depression Father    Cancer Maternal Grandfather    Factor IX deficiency Maternal Grandfather    Factor IX deficiency Son    ADD / ADHD Son    Autism Son    Kidney cancer Neg Hx    Bladder Cancer Neg Hx     Social History: I have reviewed social history from progress note on 09/26/2020. Social History   Socioeconomic History   Marital status:  Single    Spouse name: Not on file   Number of children: 2   Years of education: 12 th grade, some college   Highest education level: Not on file  Occupational History   Occupation: disabled  Tobacco Use   Smoking status: Never   Smokeless tobacco: Never  Substance and Sexual Activity   Alcohol use: No   Drug use: No   Sexual activity: Yes    Partners: Male    Birth control/protection: Condom, Pill  Other Topics Concern   Not on file  Social History Narrative   Not on file   Social Drivers of Health   Financial Resource Strain: Low Risk  (07/31/2023)   Received from [facility]   Overall Financial Resource Strain (CARDIA)    Difficulty of Paying Living Expenses: Not very hard  Food Insecurity: No Food Insecurity (07/31/2023)   Received from [facility]   Hunger Vital Sign    Worried About Running Out of Food in the Last Year: Never true    Ran Out of Food in the Last Year: Never true  Transportation Needs: No Transportation Needs (07/31/2023)   Received from [facility] - Transportation    In the past 12 months, has lack of transportation kept you from medical appointments or from getting medications?: No    Lack of Transportation (Non-Medical): No  Physical Activity: Not on file  Stress: Not on file  Social Connections: Not on file    Allergies:  Allergies  Allergen Reactions   Meperidine Anaphylaxis    Other reaction(s): Other (See Comments) Other Reaction: Not Assessed   Lactose     Other reaction(s): Other (See Comments) Other Reaction: GI Upset   Fludrocortisone Hives   Levofloxacin Hives   Sulfamethoxazole-Trimethoprim Other (See Comments)    Other reaction(s): Dizziness    Metabolic Disorder Labs: No results found for: HGBA1C, MPG No results found for: PROLACTIN No results found for: CHOL, TRIG, HDL, CHOLHDL, VLDL, LDLCALC Lab Results  Component Value Date   TSH  1.178 12/17/2022   TSH 1.276 10/30/2020    Therapeutic Level Labs: No results found for: LITHIUM No results found for: VALPROATE No results found for: CBMZ  Current Medications: Current Outpatient Medications  Medication Sig Dispense Refill   Acidophilus Lactobacillus CAPS Take 1 tablet by mouth daily.     albuterol (VENTOLIN HFA) 108 (90 Base) MCG/ACT inhaler Inhale 1 puff into the lungs every 6 (six) hours as needed for wheezing or shortness of breath.      busPIRone  (BUSPAR ) 15 MG tablet TAKE 1 TABLET(15 MG) BY MOUTH TWICE DAILY 60 tablet 2   EPINEPHrine (EPIPEN 2-PAK) 0.3 mg/0.3 mL IJ SOAJ injection Inject 0.3 mg into the muscle as needed. Reported on 02/07/2016     escitalopram  (LEXAPRO ) 20 MG tablet TAKE 1 TABLET(20 MG) BY MOUTH DAILY 30 tablet  2   fluticasone (FLONASE) 50 MCG/ACT nasal spray SHAKE LQ AND U 2 SPRAYS IEN QD     hydrOXYzine  (ATARAX ) 25 MG tablet Take 1 tablet (25 mg total) by mouth 2 (two) times daily as needed. Severe anxiety attacks only 60 tablet 1   midodrine (PROAMATINE) 10 MG tablet Take 10 mg by mouth daily.      ondansetron (ZOFRAN) 4 MG tablet Take 4 mg by mouth every 8 (eight) hours as needed for nausea.     promethazine (PHENERGAN) 25 MG tablet Take 25 mg by mouth every 8 (eight) hours as needed for vomiting.     Sodium Phosphates (RA ENEMA) 7-19 GM/118ML ENEM Place rectally as needed (constipation).     zolpidem  (AMBIEN ) 5 MG tablet TAKE 1 TABLET(5 MG) BY MOUTH AT BEDTIME AS NEEDED FOR SLEEP 30 tablet 3   No current facility-administered medications for this visit.   Facility-Administered Medications Ordered in Other Visits  Medication Dose Route Frequency Provider Last Rate Last Admin   0.9 %  sodium chloride  infusion   Intravenous Continuous Melanee Annah BROCKS, MD   Stopped at 05/09/21 1055   0.9 %  sodium chloride  infusion   Intravenous Continuous Geofm Delon BRAVO, NP   Stopped at 05/11/21 1051   0.9 %  sodium chloride  infusion   Intravenous  Continuous Melanee Annah BROCKS, MD 10 mL/hr at 05/16/21 1015 New Bag at 05/16/21 1015     Musculoskeletal: Strength & Muscle Tone: within normal limits Gait & Station: normal Patient leans: N/A  Psychiatric Specialty Exam: Review of Systems  Psychiatric/Behavioral:  The patient is nervous/anxious.     Blood pressure 123/80, pulse (!) 54, temperature 97.8 F (36.6 C), temperature source Skin, height 5' 5 (1.651 m), weight 127 lb 3.2 oz (57.7 kg), last menstrual period 05/06/2017.Body mass index is 21.17 kg/m.  General Appearance: Fairly Groomed  Eye Contact:  Fair  Speech:  Clear and Coherent  Volume:  Normal  Mood:  Anxious  Affect:  Appropriate  Thought Process:  Goal Directed and Descriptions of Associations: Intact  Orientation:  Full (Time, Place, and Person)  Thought Content: Logical   Suicidal Thoughts:  No  Homicidal Thoughts:  No  Memory:  Immediate;   Fair Recent;   Fair Remote;   Fair  Judgement:  Fair  Insight:  Fair  Psychomotor Activity:  Normal  Concentration:  Concentration: Fair and Attention Span: Fair  Recall:  [employer] of Knowledge: Fair  Language: Fair  Akathisia:  No  Handed:  Right  AIMS (if indicated): not done  Assets:  Communication Skills Desire for Improvement Housing Social Support  ADL's:  Intact  Cognition: WNL  Sleep:  Fair   Screenings: AIMS    Flowsheet Row Video Visit from 01/02/2022 in [facility]  AIMS Total Score 0      GAD-7    Flowsheet Row Office Visit from 08/19/2023 in [facility] Office Visit from 04/03/2023 in [facility] Video Visit from 03/04/2023 in [facility] Office Visit from 10/03/2022 in [facility] Video Visit from 03/20/2022 in [facility]  Total GAD-7 Score 10 5 6 9 1        PHQ2-9    Flowsheet Row Office Visit from 08/19/2023 in [facility] Office Visit from 04/03/2023 in [facility] Video Visit from 03/04/2023  in [facility] Office Visit from 10/03/2022 in [facility] Video Visit from 08/05/2022 in [facility]  PHQ-2 Total Score 2 2 0 3 6  PHQ-9 Total Score 7 5 -- 8 15      Flowsheet Row Office Visit from 08/19/2023 in [facility] Office Visit from 04/03/2023 in [facility] Video Visit from 03/04/2023 in [facility]  C-SSRS RISK CATEGORY No Risk No Risk No Risk        Assessment and Plan: Latasha [name] is a 37 year old Caucasian female who has a history of MDD, GAD, PTSD, anorexia nervosa, was evaluated in office today.  Patient is currently stable on the current medication regimen, discussed assessment and plan as noted below.  Anorexia Nervosa-improving Gained 3 pounds since August. Currently on a high-calorie diet with supplements (Boost, Clif Bars). Reports occasional food restriction and exercise, leading to syncope and bradycardia (HR 33-34 bpm). Cardiologist monitoring heart rate. Informed about risks of bradycardia and syncope, and importance of consistent high-calorie diet. - Continue high-calorie diet with supplements - Monitor weight and nutritional status - Follow-up with cardiology in March - Keep a journal of heart rate and syncope episodes - Increase salt intake - Continue midodrine 3 times daily  Generalized Anxiety Disorder-stable Mild anxiety related to weight gain and early darkness. On Buspar  15 mg BID and Lexapro  20 mg daily. Weekly EMDR therapy is helpful but challenging. Prefers no medication changes  currently. - Continue Buspar  15 mg BID - Continue Lexapro  20 mg daily - Continue weekly EMDR therapy - Use hydroxyzine  25 mg PRN - Monitor anxiety symptoms and adjust medications if necessary  Post-Traumatic Stress Disorder (PTSD)-improving Weekly EMDR therapy is helpful. Reports occasional night terrors but overall sleep improvement. - Continue weekly EMDR therapy - Monitor for any worsening of PTSD symptoms  Major Depressive Disorder, in remission No significant depressive symptoms. On Lexapro  20 mg daily and Buspar  15 mg BID. - Continue Lexapro  20 mg daily - Continue Buspar  15 mg BID - Monitor for any recurrence of depressive symptoms  Primary Insomnia-improving Improved sleep with occasional night terrors. Using Ambien  as needed. - Continue Ambien  5 mg at bedtime as needed - Monitor sleep patterns and adjust treatment if necessary   Follow-up - Schedule follow-up appointment in three months - Consider video visit if in-person visit is not feasible.   Collaboration of Care: Collaboration of Care: Referral or follow-up with counselor/therapist AEB patient encouraged to continue CBT, follow-up with nutritionist as well as cardiology.  Patient/Guardian was advised Release of Information must be obtained prior to any record release in order to collaborate their care with an outside provider. Patient/Guardian was advised if they have not already done so to contact the registration department to sign all necessary forms in order for us  to release information regarding their care.   Consent: Patient/Guardian gives verbal consent for treatment and assignment of benefits for services provided during this visit. Patient/Guardian expressed understanding and agreed to proceed.   This note was generated in part or whole with voice recognition software. Voice recognition is usually quite accurate but there are transcription errors that can and very often do occur. I apologize for any  typographical errors that were not detected and corrected.    Frenchie Dangerfield, MD 08/19/2023, 2:01 PM

## 2023-10-07 ENCOUNTER — Telehealth: Payer: Self-pay

## 2023-10-07 DIAGNOSIS — F411 Generalized anxiety disorder: Secondary | ICD-10-CM

## 2023-10-07 DIAGNOSIS — F4310 Post-traumatic stress disorder, unspecified: Secondary | ICD-10-CM

## 2023-10-07 MED ORDER — ESCITALOPRAM OXALATE 20 MG PO TABS: 20.0000 mg | ORAL_TABLET | Freq: Every day | ORAL | 2 refills | Status: DC

## 2023-10-07 NOTE — Telephone Encounter (Signed)
 I have sent Lexapro to pharmacy.

## 2023-10-07 NOTE — Telephone Encounter (Signed)
 pt left a message that she needs a refill on the lexapro. pt was last seen on 12-31 next appt 4-3

## 2023-10-08 NOTE — Telephone Encounter (Signed)
 Pt.notified

## 2023-10-24 ENCOUNTER — Other Ambulatory Visit: Payer: Self-pay | Admitting: Psychiatry

## 2023-10-24 DIAGNOSIS — F3341 Major depressive disorder, recurrent, in partial remission: Secondary | ICD-10-CM

## 2023-10-24 DIAGNOSIS — F411 Generalized anxiety disorder: Secondary | ICD-10-CM

## 2023-11-13 ENCOUNTER — Encounter: Payer: Self-pay | Admitting: Internal Medicine

## 2023-11-20 ENCOUNTER — Ambulatory Visit (INDEPENDENT_AMBULATORY_CARE_PROVIDER_SITE_OTHER): Payer: Self-pay | Admitting: Psychiatry

## 2023-11-20 ENCOUNTER — Encounter: Payer: Self-pay | Admitting: Psychiatry

## 2023-11-20 VITALS — BP 120/76 | HR 63 | Temp 98.7°F | Ht 65.0 in | Wt 121.8 lb

## 2023-11-20 DIAGNOSIS — F5000 Anorexia nervosa, unspecified: Secondary | ICD-10-CM

## 2023-11-20 DIAGNOSIS — F411 Generalized anxiety disorder: Secondary | ICD-10-CM | POA: Diagnosis not present

## 2023-11-20 DIAGNOSIS — F4310 Post-traumatic stress disorder, unspecified: Secondary | ICD-10-CM | POA: Diagnosis not present

## 2023-11-20 DIAGNOSIS — F5101 Primary insomnia: Secondary | ICD-10-CM

## 2023-11-20 DIAGNOSIS — F331 Major depressive disorder, recurrent, moderate: Secondary | ICD-10-CM | POA: Diagnosis not present

## 2023-11-20 MED ORDER — LORAZEPAM 1 MG PO TABS: 0.5000 mg | ORAL_TABLET | Freq: Every day | ORAL | 0 refills | Status: AC | PRN

## 2023-11-20 MED ORDER — BUSPIRONE HCL 15 MG PO TABS: 15.0000 mg | ORAL_TABLET | Freq: Three times a day (TID) | ORAL | 2 refills | Status: DC

## 2023-11-20 MED ORDER — HYDROXYZINE HCL 25 MG PO TABS: 25.0000 mg | ORAL_TABLET | Freq: Two times a day (BID) | ORAL | 1 refills | Status: DC | PRN

## 2023-11-20 MED ORDER — ZOLPIDEM TARTRATE 5 MG PO TABS: 5.0000 mg | ORAL_TABLET | Freq: Every evening | ORAL | 3 refills | Status: DC | PRN

## 2023-11-20 NOTE — Progress Notes (Signed)
 BH MD OP Progress Note  11/20/2023 7:01 PM BABARA BUFFALO  MRN:  [id number]  Chief Complaint:  Chief Complaint  Patient presents with   Follow-up   Depression   Anxiety   Eating Disorder   Post-Traumatic Stress Disorder   Medication Refill   HPI: Latasha [name] is a 38 year old patient female on SSD, lives in [place], has a history of GAD, MDD, PTSD, anorexia nervosa, primary insomnia, Hirschsprung's disease, iron deficiency anemia, chronic constipation, chronic fatigue syndrome was evaluated in office today.  Since her last visit in December, she has experienced worsening depression, accompanied by significant weight loss from 127 pounds to 121 pounds. She continues to take Lexapro 20 mg, Buspar 15 mg, Ambien 5 mg, and hydroxyzine as needed. She describes her depression as a 'little struggle' and attributes some of her emotional distress to recurrent urinary tract infections (UTIs). Denies suicidal thoughts , and sleep is 'okay' with the use of Ambien as needed.  She experiences recurrent UTIs, approximately three per month, with visible hematuria. Multiple courses of antibiotics have been used, but they lose effectiveness once stopped. 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  She acknowledges using anorexia as a coping mechanism, restricting intake to two protein bars and one regular meal per day, with only water and coffee in between. This restriction may contribute to weight loss .  PTSD symptoms, including panic attacks and anxiety, are exacerbated by medical procedures. She has experienced trauma related to medical interventions. Hydroxyzine is used as needed for anxiety. Her therapist has stopped seeing clients, and she is in the process of finding a new therapist who accepts both Medicare and Medicaid.   Visit Diagnosis:    ICD-10-CM   1. GAD (generalized anxiety disorder)  F41.1 hydrOXYzine (ATARAX) 25 MG  tablet    busPIRone (BUSPAR) 15 MG tablet    LORazepam (ATIVAN) 1 MG tablet    2. MDD (major depressive disorder), recurrent episode, moderate (HCC)  F33.1     3. PTSD (post-traumatic stress disorder)  F43.10     4. Anorexia nervosa  F50.00     5. Primary insomnia  F51.01 zolpidem (AMBIEN) 5 MG tablet      Past Psychiatric History: I have reviewed past psychiatric history from progress note on 09/26/2020.  Past trials of Seroquel, Wellbutrin, BuSpar, Prozac, Paxil, Xanax, Effexor, Lexapro, Zoloft, Cymbalta.  Past Medical History:  Past Medical History:  Diagnosis Date   Acid reflux    Anemia    Anxiety    Asthma    Chronic constipation    Hirschsprung disease    Pelvic floor dysfunction    Syncope, cardiogenic     Past Surgical History:  Procedure Laterality Date   abdomial adhesions removed  08/19/2010   ABDOMINAL HYSTERECTOMY     APPENDECTOMY  08/20/1995   bladder repair surgery  08/20/1995   c- sections  [id number]   enodmetiosis removed  08/19/2010   OVARIAN CYST REMOVAL  2003, 2012   TONSILLECTOMY  08/19/2005    Family Psychiatric History: I have reviewed family psychiatric history from progress note on 09/26/2020.  Family History:  Family History  Problem Relation Age of Onset   Cancer Mother    Diabetes Mother    Hypertension Mother    Cancer Father    Alcohol abuse Father    Depression Father    Cancer Maternal Grandfather    Factor IX deficiency Maternal Grandfather  Factor IX deficiency Son    ADD / ADHD Son    Autism Son    Kidney cancer Neg Hx    Bladder Cancer Neg Hx     Social History: I have reviewed social history from progress note on 09/26/2020. Social History   Socioeconomic History   Marital status: Single    Spouse name: Not on file   Number of children: 2   Years of education: 12 th grade, some college   Highest education level: Not on file  Occupational History   Occupation: disabled  Tobacco Use   Smoking status: Never    Smokeless tobacco: Never  Substance and Sexual Activity   Alcohol use: No   Drug use: No   Sexual activity: Yes    Partners: Male    Birth control/protection: Condom, Pill  Other Topics Concern   Not on file  Social History Narrative   Not on file   Social Drivers of Health   Financial Resource Strain: Low Risk  (07/31/2023)   [facility]   Overall Financial Resource Strain (CARDIA)    Difficulty of Paying Living Expenses: Not very hard  Food Insecurity: No Food Insecurity (07/31/2023)   [facility]   Hunger Vital Sign    Worried About Running Out of Food in the Last Year: Never true    Ran Out of Food in the Last Year: Never true  Transportation Needs: No Transportation Needs (07/31/2023)   Received from [facility]    In the past 12 months, has lack of transportation kept you from medical appointments or from getting medications?: No    Lack of Transportation (Non-Medical): No  Physical Activity: Not on file  Stress: Not on file  Social Connections: Not on file    Allergies:  Allergies  Allergen Reactions   Meperidine Anaphylaxis    Other reaction(s): Other (See Comments) Other Reaction: Not Assessed   Lactose     Other reaction(s): Other (See Comments) Other Reaction: GI Upset   Fludrocortisone Hives   Levofloxacin Hives   Sulfamethoxazole-Trimethoprim Other (See Comments)    Other reaction(s): Dizziness    Metabolic Disorder Labs: No results found for: "HGBA1C", "MPG" No results found for: "PROLACTIN" No results found for: "CHOL", "TRIG", "HDL", "CHOLHDL", "VLDL", "LDLCALC" Lab Results  Component Value Date   TSH 1.178 12/17/2022   TSH 1.276 10/30/2020    Therapeutic Level Labs: No results found for: "LITHIUM" No results found for: "VALPROATE" No results found for: "CBMZ"  Current Medications: Current Outpatient Medications  Medication Sig  Dispense Refill   Acidophilus Lactobacillus CAPS Take 1 tablet by mouth daily.     albuterol (VENTOLIN HFA) 108 (90 Base) MCG/ACT inhaler Inhale 1 puff into the lungs every 6 (six) hours as needed for wheezing or shortness of breath.      ciprofloxacin (CIPRO) 250 MG tablet Take by mouth.     EPINEPHrine (EPIPEN 2-PAK) 0.3 mg/0.3 mL IJ SOAJ injection Inject 0.3 mg into the muscle as needed. Reported on 02/07/2016     escitalopram (LEXAPRO) 20 MG tablet Take 1 tablet (20 mg total) by mouth daily. 30 tablet 2   fluconazole (DIFLUCAN) 150 MG tablet Take 150 mg by mouth once.     fluticasone (FLONASE) 50 MCG/ACT nasal spray SHAKE LQ AND U 2 SPRAYS IEN QD     LORazepam (ATIVAN) 1 MG tablet Take 0.5-1 tablets (0.5-1 mg total) by mouth  daily as needed for up to 3 days for anxiety. Take 30 minutes before your procedure , please consult with your surgeon. 3 tablet 0   midodrine (PROAMATINE) 10 MG tablet Take 10 mg by mouth daily.      ondansetron (ZOFRAN) 4 MG tablet Take 4 mg by mouth every 8 (eight) hours as needed for nausea.     promethazine (PHENERGAN) 25 MG tablet Take 25 mg by mouth every 8 (eight) hours as needed for vomiting.     Sodium Phosphates (RA ENEMA) 7-19 GM/118ML ENEM Place rectally as needed (constipation).     busPIRone (BUSPAR) 15 MG tablet Take 1 tablet (15 mg total) by mouth 3 (three) times daily. 90 tablet 2   hydrOXYzine (ATARAX) 25 MG tablet Take 1 tablet (25 mg total) by mouth 2 (two) times daily as needed. Severe anxiety attacks only 60 tablet 1   zolpidem (AMBIEN) 5 MG tablet Take 1 tablet (5 mg total) by mouth at bedtime as needed for sleep. 30 tablet 3   No current facility-administered medications for this visit.   Facility-Administered Medications Ordered in Other Visits  Medication Dose Route Frequency Provider Last Rate Last Admin   0.9 %  sodium chloride infusion   Intravenous Continuous Creig Hines, MD   Stopped at 05/09/21 1055   0.9 %  sodium chloride infusion    Intravenous Continuous Mauro Kaufmann, NP   Stopped at 05/11/21 1051   0.9 %  sodium chloride infusion   Intravenous Continuous Creig Hines, MD 10 mL/hr at 05/16/21 1015 New Bag at 05/16/21 1015     Musculoskeletal: Strength & Muscle Tone: within normal limits Gait & Station: normal Patient leans: N/A  Psychiatric Specialty Exam: Review of Systems  Psychiatric/Behavioral:  Positive for dysphoric mood. The patient is nervous/anxious.     Blood pressure 120/76, pulse 63, temperature 98.7 F (37.1 C), temperature source Temporal, height 5\' 5"  (1.651 m), weight 121 lb 12.8 oz (55.2 kg), last menstrual period 05/06/2017, SpO2 100%.Body mass index is 20.27 kg/m.  General Appearance: Casual  Eye Contact:  Fair  Speech:  Clear and Coherent  Volume:  Normal  Mood:  Anxious and Depressed  Affect:  Appropriate  Thought Process:  Goal Directed and Descriptions of Associations: Intact  Orientation:  Full (Time, Place, and Person)  Thought Content: Logical   Suicidal Thoughts:  No  Homicidal Thoughts:  No  Memory:  Immediate;   Fair Recent;   Fair Remote;   Fair  Judgement:  Fair  Insight:  Fair  Psychomotor Activity:  Normal  Concentration:  Concentration: Fair and Attention Span: Fair  Recall:  [employer] of Knowledge: Fair  Language: Fair  Akathisia:  No  Handed:  Right  AIMS (if indicated): not done  Assets:  Desire for Improvement Housing Social Support Transportation  ADL's:  Intact  Cognition: WNL  Sleep:  Fair   Screenings: AIMS    Flowsheet Row Video Visit from 01/02/2022 in [facility]  AIMS Total Score 0      GAD-7    Flowsheet Row Office Visit from 11/20/2023 in [facility] Office Visit from 08/19/2023 in [facility] Office Visit from 04/03/2023 in [facility] Video Visit from 03/04/2023 in [facility] Office Visit from 10/03/2022 in [facility]  Total GAD-7 Score 11 10 5 6  9  PHQ2-9    Flowsheet Row Office Visit from 11/20/2023 in [facility] Office Visit from 08/19/2023 in [facility] Office Visit from 04/03/2023 in [facility] Video Visit from 03/04/2023 in [facility] Office Visit from 10/03/2022 in [facility]  PHQ-2 Total Score 4 2 2  0 3  PHQ-9 Total Score 12 7 5  -- 8      Flowsheet Row Office Visit from 11/20/2023 in [facility] Office Visit from 08/19/2023 in [facility] Office Visit from 04/03/2023 in [facility]  C-SSRS RISK CATEGORY No Risk No Risk No Risk        Assessment and Plan: CITLALIC NORLANDER is a 38 year old Caucasian female who has a history of MDD, GAD, PTSD, anorexia nervosa was evaluated in office today.  Discussed assessment and plan as noted below.  Major Depressive Disorder-unstable Severe depression exacerbated by recurrent UTIs and associated medical trauma, impacting weight and coping mechanisms, leading to food restriction. Current regimen includes Lexapro 20 mg, maintaining stability. - Continue Lexapro 20 mg daily. - Increase Buspar to 15 mg three times a day, allowing flexible dosing. - Provide a list of therapists for re-establishing care.  Post-Traumatic Stress Disorder (PTSD)-unstable PTSD exacerbated by medical trauma from recurrent UTIs and upcoming procedures, with panic attacks and anxiety not fully managed by current medications. Requires new therapist due to previous therapist's career change. Ativan offered for procedural anxiety, with  home testing advised. - Prescribe Ativan 1 mg, to be taken 20-30 minutes before bladder scope procedure, with home testing prior. - Provide a list of trauma-specialized therapists, including [facility]. - Encourage communication with previous therapist for referrals. - Continue Lexapro as prescribed.  Generalized anxiety disorder-unstable Patient with anxiety related to current physical symptoms upcoming procedure. - Increase BuSpar 15 mg 3 times a day. - Continue Hydroxyzine 25 mg twice a day as needed for anxiety. - Patient to restart CBT.  Anorexia Nervosa-unstable Anorexia used as a coping mechanism, leading to weight loss (127 lbs to 121 lbs since December). Consumes two protein bars and one meal daily, with no supplements, likely weakening immune system and contributing to recurrent UTIs. Discussed importance of nutritional intake for immune support. - Encourage increased caloric intake with high-calorie supplements like Boost. - Suggest setting reminders for nutritional intake. - Discuss potential referral to an eating disorder clinic, specifically [facility].  Patient agrees to reach out to them.  Insomnia-stable Experiences insomnia, managed with Ambien 5 mg as needed. Current regimen effective, may need refill soon. - Continue Ambien 5 mg as needed. - Reviewed [place] PMP AWARxE   Collaboration of Care: Collaboration of Care: Referral or follow-up with counselor/therapist AEB patient encouraged to establish care with therapist.  Provider resources and also advised to reach out to [facility] eating disorder clinic.  Patient/Guardian was advised Release of Information must be obtained prior to any record release in order to collaborate their care with an outside provider. Patient/Guardian was advised if they have not already done so to contact the registration department to sign all necessary forms in order for [place] to release information regarding their care.   Consent:  Patient/Guardian gives verbal consent for treatment and assignment of benefits for services provided during this visit. Patient/Guardian expressed understanding and agreed to proceed.  This note was generated in part or whole with  voice recognition software. Voice recognition is usually quite accurate but there are transcription errors that can and very often do occur. I apologize for any typographical errors that were not detected and corrected.   Discussed the use of a AI scribe software for clinical note transcription with the patient, who gave verbal consent to proceed.    Jomarie Longs, MD 11/20/2023, 7:01 PM

## 2023-11-20 NOTE — Patient Instructions (Signed)
 [email or web address]  [email or web address]  [email or web address] [facility] [address], [place], [place] [address]   [phone]  [facility] [email or web address] [address] [place], [place] [address]  [phone]  [facility] [email or web address] [address], [place], [place] [address]  [phone]   [facility] - [phone]  [facility] - [phone]  [facility] - [phone] counseling [phone]  [facility] [phone]   [email or web address] this clinician can offer telehealth and has a sliding scale option  [email or web address] this group also offers sliding scale rates and is based out of [place]  Dr. Liborio Nixon with the [facility] specializes in divorce  [employer] [facility] has interns who offer sliding scale rates and some of the full time clinicians do, as well. You complete their contact form on their website and the referrals coordinator will help to get connected to someone   [email or web address] [phone]  Medicaid below :  [facility] [address] [place], [place] [address]  [id number]    [employer] [address] [place], [place] [address]  [phone]    [facility] [address] [place], [place] [address]  [phone]     Lorazepam Tablets What is this medication? LORAZEPAM (lor A ze pam) treats anxiety. It works by Education administrator system slow down. It belongs to a group of medications called benzodiazepines. This medicine may be used for other purposes; ask your health care provider or pharmacist if you have questions. COMMON BRAND NAME(S): Ativan What should I  tell my care team before I take this medication? They need to know if you have any of these conditions: Glaucoma Kidney disease Liver disease Lung or breathing disease, such as asthma or COPD Mental health condition Myasthenia gravis Sleep apnea Substance use disorder Suicidal thoughts, plans, or attempt by you or a family member An unusual or allergic reaction to lorazepam, other medications, foods, dyes, or preservatives Pregnant or trying to get pregnant Breast-feeding How should I use this medication? Take this medication by mouth with water. Take it as directed on the prescription label at the same time every day. Keep taking it unless your care team tells you to stop. A special MedGuide will be given to you by the pharmacist with each prescription and refill. Be sure to read this information carefully each time. Talk to your care team about the use of this medication in children. While this medication may be used in children as young as 12 years for selected conditions, precautions do apply. Overdosage: If you think you have taken too much of this medicine contact a poison control center or emergency room at once. NOTE: This medicine is only for you. Do not share this medicine with others. What if I miss a dose? If you miss a dose, take it as soon as you can. If it is almost time for your next dose, take only that dose. Do not take double or extra doses. What may interact with this medication? Do not take this medication with any of the following: Calcium, Magnesium, Potassium, Sodium oxybates Sodium oxybate This medication may also interact with the following: Alcohol Certain antihistamines Certain medications for depression, such as amitriptyline or trazodone Certain medications for seizures, such as phenobarbital or primidone Divalproex sodium Medications that cause drowsiness before  a procedure, such as propofol Medications that help you fall asleep Medications that relax  muscles MAOIs, such as Marplan, Nardil, and Parnate Opioids for pain or cough Phenothiazines, such as chlorpromazine, prochlorperazine, thioridazine Probenecid Valproate Valproic acid This list may not describe all possible interactions. Give your health care provider a list of all the medicines, herbs, non-prescription drugs, or dietary supplements you use. Also tell them if you smoke, drink alcohol, or use illegal drugs. Some items may interact with your medicine. What should I watch for while using this medication? Visit your care team for regular checks on your progress. Tell your care team if your symptoms do not start to get better or if they get worse. This medication may affect your coordination, reaction time, or judgment. Do not drive or operate machinery until you know how this medication affects you. Sit up or stand slowly to reduce the risk of dizzy or fainting spells. Drinking alcohol with this medication can increase the risk of these side effects. If you take other medications that also cause drowsiness such as other opioid pain medications, benzodiazepines, or other medications for sleep, you may have more side effects. Give your care team a list of all medications you use. They will tell you how much medication to take. Do not take more medication than directed. Call emergency services if you have problems breathing or are unusually tired or sleepy. If you or your family notice any changes in your behavior, such as new or worsening depression, thoughts of harming yourself, anxiety, other unusual or disturbing thoughts, or memory loss, call your care team right away. This medication has a risk of abuse and dependence. Your care team will check you for this while you take this medication. Long term use of this medication may cause your brain and body to depend on it. This can happen even when used as directed by your care team. You and your care team will work together to determine how  long you will need to take this medication. If your care team wants you to stop this medication, the dose will be slowly lowered over time to reduce the risk of side effects. Talk to your care team if you wish to become pregnant or think you might be pregnant. Talk to your care team before breastfeeding. Changes to your treatment plan may be needed. What side effects may I notice from receiving this medication? Side effects that you should report to your care team as soon as possible: Allergic reactions--skin rash, itching, hives, swelling of the face, lips, tongue, or throat CNS depression--slow or shallow breathing, shortness of breath, feeling faint, dizziness, confusion, difficulty staying awake Thoughts of suicide or self-harm, worsening mood, feelings of depression Side effects that usually do not require medical attention (report to your care team if they continue or are bothersome): Dizziness Drowsiness Headache Nausea Vomiting This list may not describe all possible side effects. Call your doctor for medical advice about side effects. You may report side effects to FDA at 1-800-FDA-1088. Where should I keep my medication? Keep out of the reach of children. This medication can be abused. Keep your medication in a safe place to protect it from theft. Do not share this medication with anyone. Selling or giving away this medication is dangerous and against the law. Store at room temperature between 20 and 25 degrees C (68 and 77 degrees F). Protect from light. Keep container tightly closed. This medication may cause accidental overdose and death if taken by other  adults, children, or pets. Mix any unused medication with a substance like cat litter or coffee grounds. Then throw the medication away in a sealed container like a sealed bag or a coffee can with a lid. Do not use the medication after the expiration date. NOTE: This sheet is a summary. It may not cover all possible information. If  you have questions about this medicine, talk to your doctor, pharmacist, or health care provider.  2024 Elsevier/Gold Standard (2022-01-10 00:00:00)

## 2023-11-26 ENCOUNTER — Telehealth: Payer: Self-pay | Admitting: Psychiatry

## 2023-11-26 NOTE — Telephone Encounter (Signed)
 Contacted patient to give information for the [facility] intensive outpatient program for eating disorder.

## 2023-12-24 DIAGNOSIS — G90A Postural orthostatic tachycardia syndrome (POTS): Secondary | ICD-10-CM | POA: Insufficient documentation

## 2023-12-24 DIAGNOSIS — Q796 Ehlers-Danlos syndrome, unspecified: Secondary | ICD-10-CM | POA: Insufficient documentation

## 2024-01-27 ENCOUNTER — Encounter: Payer: Self-pay | Admitting: Psychiatry

## 2024-01-27 ENCOUNTER — Telehealth (INDEPENDENT_AMBULATORY_CARE_PROVIDER_SITE_OTHER): Admitting: Psychiatry

## 2024-01-27 DIAGNOSIS — F4310 Post-traumatic stress disorder, unspecified: Secondary | ICD-10-CM

## 2024-01-27 DIAGNOSIS — F5000 Anorexia nervosa, unspecified: Secondary | ICD-10-CM | POA: Diagnosis not present

## 2024-01-27 DIAGNOSIS — F411 Generalized anxiety disorder: Secondary | ICD-10-CM | POA: Diagnosis not present

## 2024-01-27 DIAGNOSIS — F3342 Major depressive disorder, recurrent, in full remission: Secondary | ICD-10-CM | POA: Diagnosis not present

## 2024-01-27 DIAGNOSIS — F5101 Primary insomnia: Secondary | ICD-10-CM

## 2024-01-27 MED ORDER — ESCITALOPRAM OXALATE 20 MG PO TABS: 20.0000 mg | ORAL_TABLET | Freq: Every day | ORAL | 2 refills | Status: DC

## 2024-01-27 NOTE — Progress Notes (Signed)
 Virtual Visit via Video Note  I connected with Latasha [name] on 01/27/24 at  3:00 PM EDT by a video enabled telemedicine application and verified that I am speaking with the correct person using two identifiers.  Location Provider Location : [facility] Patient Location : Home  Participants: Patient , Provider   I discussed the limitations of evaluation and management by telemedicine and the availability of in person appointments. The patient expressed understanding and agreed to proceed.   I discussed the assessment and treatment plan with the patient. The patient was provided an opportunity to ask questions and all were answered. The patient agreed with the plan and demonstrated an understanding of the instructions.   The patient was advised to call back or seek an in-person evaluation if the symptoms worsen or if the condition fails to improve as anticipated.                                                                                        BH MD OP Progress Note  01/27/2024 3:31 PM [name]  MRN:  [id number]  Chief Complaint:  Chief Complaint  Patient presents with   Follow-up   Anxiety   Eating Disorder   Medication Refill   Discussed the use of AI scribe software for clinical note transcription with the patient, who gave verbal consent to proceed.  History of Present Illness Latasha [name] is a 38 year old Caucasian female, on SSD, lives in [place], has a history of GAD, MDD, PTSD, anorexia nervosa, primary insomnia, Hirschsprung's disease, iron  deficiency anemia, chronic constipation, chronic fatigue syndrome was evaluated by telemedicine today.  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 She has a history of recurrent UTIs and hematuria, which led to the imaging study.  She has upcoming procedure planned to address that.  That does make her  anxious.Her 87 year old son has a benign brain cyst compressing the pituitary gland and a bleeding disorder, which has been a significant stressor as well.  She is interested in establishing care with a therapist and agrees to reach out to community resources.  She is currently taking Buspar  15 mg three times a day, Lexapro  20 mg daily, and uses Ambien  occasionally for sleep. Hydroxyzine  is used as needed. She maintains her weight at 121 lbs and denies any problems with her eating pattern.  She denies any anorexic episodes recently.  She has been experiencing symptoms of a viral illness for almost a week, with negative COVID tests, and feels 'knocked down' by it.  She agrees to reach out to primary care provider.  She denies any suicidality, homicidality or perceptual disturbances.    Visit Diagnosis:    ICD-10-CM   1. GAD (generalized anxiety disorder)  F41.1 escitalopram  (LEXAPRO ) 20 MG tablet    2. MDD (major depressive disorder), recurrent, in full remission (HCC)  F33.42     3. PTSD (post-traumatic stress disorder)  F43.10 escitalopram  (LEXAPRO ) 20 MG tablet    4. Anorexia nervosa  F50.00     5.  Primary insomnia  F51.01       Past Psychiatric History: I have reviewed past psychiatric history from progress note on 09/26/2020.  Past trials of Seroquel , Wellbutrin, BuSpar , Prozac, Paxil, Xanax, Effexor, Lexapro , Zoloft , Cymbalta.  Past Medical History:  Past Medical History:  Diagnosis Date   Acid reflux    Anemia    Anxiety    Asthma    Chronic constipation    Hirschsprung disease    Pelvic floor dysfunction    Syncope, cardiogenic     Past Surgical History:  Procedure Laterality Date   abdomial adhesions removed  08/19/2010   ABDOMINAL HYSTERECTOMY     APPENDECTOMY  08/20/1995   bladder repair surgery  08/20/1995   c- sections  [id number]   enodmetiosis removed  08/19/2010   OVARIAN CYST REMOVAL  2003, 2012   TONSILLECTOMY  08/19/2005    Family Psychiatric History:  I have reviewed family psychiatric history from progress note on 09/26/2020.  Family History:  Family History  Problem Relation Age of Onset   Cancer Mother    Diabetes Mother    Hypertension Mother    Cancer Father    Alcohol abuse Father    Depression Father    Cancer Maternal Grandfather    Factor IX deficiency Maternal Grandfather    Factor IX deficiency Son    ADD / ADHD Son    Autism Son    Kidney cancer Neg Hx    Bladder Cancer Neg Hx     Social History: I have reviewed social history from progress note on 09/26/2020. Social History   Socioeconomic History   Marital status: Single    Spouse name: Not on file   Number of children: 2   Years of education: 12 th grade, some college   Highest education level: Not on file  Occupational History   Occupation: disabled  Tobacco Use   Smoking status: Never   Smokeless tobacco: Never  Substance and Sexual Activity   Alcohol use: No   Drug use: No   Sexual activity: Yes    Partners: Male    Birth control/protection: Condom, Pill  Other Topics Concern   Not on file  Social History Narrative   Not on file   Social Drivers of Health   Financial Resource Strain: Low Risk  (07/31/2023)   Received from [facility]   Overall Financial Resource Strain (CARDIA)    Difficulty of Paying Living Expenses: Not very hard  Food Insecurity: No Food Insecurity (07/31/2023)   Received from [facility]   Hunger Vital Sign    Worried About Running Out of Food in the Last Year: Never true    Ran Out of Food in the Last Year: Never true  Transportation Needs: No Transportation Needs (07/31/2023)   Received from [facility] - Transportation    In the past 12 months, has lack of transportation kept you from medical appointments or from getting medications?: No    Lack of Transportation (Non-Medical): No  Physical Activity: Not on file  Stress: Not on file  Social  Connections: Not on file    Allergies:  Allergies  Allergen Reactions   Meperidine Anaphylaxis    Other reaction(s): Other (See Comments) Other Reaction: Not Assessed   Lactose     Other reaction(s): Other (See Comments) Other Reaction: GI Upset   Fludrocortisone Hives   Levofloxacin Hives   Sulfamethoxazole-Trimethoprim Other (See Comments)    Other reaction(s):  Dizziness    Metabolic Disorder Labs: No results found for: "HGBA1C", "MPG" No results found for: "PROLACTIN" No results found for: "CHOL", "TRIG", "HDL", "CHOLHDL", "VLDL", "LDLCALC" Lab Results  Component Value Date   TSH 1.178 12/17/2022   TSH 1.276 10/30/2020    Therapeutic Level Labs: No results found for: "LITHIUM" No results found for: "VALPROATE" No results found for: "CBMZ"  Current Medications: Current Outpatient Medications  Medication Sig Dispense Refill   Acidophilus Lactobacillus CAPS Take 1 tablet by mouth daily.     albuterol (VENTOLIN HFA) 108 (90 Base) MCG/ACT inhaler Inhale 1 puff into the lungs every 6 (six) hours as needed for wheezing or shortness of breath.      busPIRone  (BUSPAR ) 15 MG tablet Take 1 tablet (15 mg total) by mouth 3 (three) times daily. 90 tablet 2   EPINEPHrine (EPIPEN 2-PAK) 0.3 mg/0.3 mL IJ SOAJ injection Inject 0.3 mg into the muscle as needed. Reported on 02/07/2016     escitalopram  (LEXAPRO ) 20 MG tablet Take 1 tablet (20 mg total) by mouth daily. 30 tablet 2   fluconazole  (DIFLUCAN ) 150 MG tablet Take 150 mg by mouth once.     fluticasone (FLONASE) 50 MCG/ACT nasal spray SHAKE LQ AND U 2 SPRAYS IEN QD     hydrOXYzine  (ATARAX ) 25 MG tablet Take 1 tablet (25 mg total) by mouth 2 (two) times daily as needed. Severe anxiety attacks only 60 tablet 1   midodrine (PROAMATINE) 10 MG tablet Take 10 mg by mouth daily.      ondansetron (ZOFRAN) 4 MG tablet Take 4 mg by mouth every 8 (eight) hours as needed for nausea.     promethazine (PHENERGAN) 25 MG tablet Take 25 mg by mouth  every 8 (eight) hours as needed for vomiting.     Sodium Phosphates (RA ENEMA) 7-19 GM/118ML ENEM Place rectally as needed (constipation).     zolpidem  (AMBIEN ) 5 MG tablet Take 1 tablet (5 mg total) by mouth at bedtime as needed for sleep. 30 tablet 3   No current facility-administered medications for this visit.   Facility-Administered Medications Ordered in Other Visits  Medication Dose Route Frequency Provider Last Rate Last Admin   0.9 %  sodium chloride  infusion   Intravenous Continuous Avonne Boettcher, MD   Stopped at 05/09/21 1055   0.9 %  sodium chloride  infusion   Intravenous Continuous Aurther Blue, NP   Stopped at 05/11/21 1051   0.9 %  sodium chloride  infusion   Intravenous Continuous Avonne Boettcher, MD 10 mL/hr at 05/16/21 1015 New Bag at 05/16/21 1015     Musculoskeletal: Strength & Muscle Tone: UTA Gait & Station: Seated Patient leans: N/A  Psychiatric Specialty Exam: Review of Systems  Psychiatric/Behavioral:  The patient is nervous/anxious.     Last menstrual period 05/06/2017.There is no height or weight on file to calculate BMI.  General Appearance: Casual  Eye Contact:  Fair  Speech:  Clear and Coherent  Volume:  Normal  Mood:  Anxious  Affect:  Appropriate  Thought Process:  Goal Directed and Descriptions of Associations: Intact  Orientation:  Full (Time, Place, and Person)  Thought Content: Logical   Suicidal Thoughts:  No  Homicidal Thoughts:  No  Memory:  Immediate;   Fair Recent;   Fair Remote;   Fair  Judgement:  Fair  Insight:  Fair  Psychomotor Activity:  Normal  Concentration:  Concentration: Fair and Attention Span: Fair  Recall:  [employer] of Knowledge: Fair  Language: Fair  Akathisia:  No  Handed:  Right  AIMS (if indicated): not done  Assets:  Communication Skills Desire for Improvement Housing Social Support Vocational/Educational  ADL's:  Intact  Cognition: WNL  Sleep:  Fair   Screenings: AIMS    Flowsheet Row Video  Visit from 01/02/2022 in [facility]  AIMS Total Score 0      GAD-7    Flowsheet Row Office Visit from 11/20/2023 in [facility] Office Visit from 08/19/2023 in [facility] Office Visit from 04/03/2023 in [facility] Video Visit from 03/04/2023 in [facility] Office Visit from 10/03/2022 in [facility]  Total GAD-7 Score 11 10 5 6 9       PHQ2-9    Flowsheet Row Office Visit from 11/20/2023 in [facility] Office Visit from 08/19/2023 in [facility] Office Visit from 04/03/2023 in [facility] Video Visit from 03/04/2023 in [facility] Office Visit from 10/03/2022 in [facility]  PHQ-2 Total Score 4 2 2  0 3  PHQ-9 Total Score 12 7 5  -- 8      Flowsheet Row Video Visit from 01/27/2024 in [facility] Office Visit from 11/20/2023 in [facility] Office Visit from 08/19/2023 in [facility]  C-SSRS RISK CATEGORY No Risk No Risk No Risk        Assessment and Plan: PHILANA YOUNIS is a 38 year old Caucasian female who has a history of MDD, GAD, PTSD, anorexia nervosa was evaluated by telemedicine today.  Discussed assessment and plan as noted below.  Major depression in remission Currently reports depression symptoms as improved on the current medication regimen.  Does have anxiety regarding situational stressors.  Has been compliant on medications like Lexapro , BuSpar . Continue Lexapro  20 mg daily Continue BuSpar  15 mg 3 times a  day  Posttraumatic stress disorder-improving Currently reports mood symptoms and sleep is improving.  Agreeable to restarting psychotherapy and will reach out to community resources. Continue Lexapro  as prescribed Provided information for [facility].  Generalized anxiety disorder-unstable Current anxiety due to her own medical illness, upcoming procedure and her son's health issues.  She has been trying to manage anxiety with the use of her current medications and is agreeable to restarting therapy. Continue BuSpar  15 mg 3 times a day, declines further dosage increase. Continue Hydroxyzine  25 mg twice a day as needed Agreeable to restarting psychotherapy sessions.  Anorexia nervosa-improving Currently making use of coping tools and maintaining her weight. Encouraged to continue to monitor caloric intake. Encouraged to establish care with therapist.  Insomnia-stable Currently denies any significant sleep problems.  Uses the Ambien  as needed which has been beneficial. Continue Ambien  5 mg at bedtime as needed Reviewed [place]MP AWARxE  Follow-up Follow-up in clinic in 2 to 3 months or sooner if needed.  Collaboration of Care: Collaboration of Care: Referral or follow-up with counselor/therapist AEB patient is to establish care with therapist.  Previously provided information for eating disorder clinic.  Also encouraged to follow up with primary care provider for current upper respiratory tract infection symptoms.  Patient/Guardian was advised Release of Information must be obtained prior to any record release in order to collaborate their care with an outside  provider. Patient/Guardian was advised if they have not already done so to contact the registration department to sign all necessary forms in order for us  to release information regarding their care.   Consent: Patient/Guardian gives verbal consent for treatment and assignment of benefits for services provided during this visit.  Patient/Guardian expressed understanding and agreed to proceed.  This note was generated in part or whole with voice recognition software. Voice recognition is usually quite accurate but there are transcription errors that can and very often do occur. I apologize for any typographical errors that were not detected and corrected.     Loreley Schwall, MD 01/27/2024, 3:31 PM

## 2024-03-30 ENCOUNTER — Other Ambulatory Visit: Payer: Self-pay | Admitting: Psychiatry

## 2024-03-30 DIAGNOSIS — F411 Generalized anxiety disorder: Secondary | ICD-10-CM

## 2024-04-26 ENCOUNTER — Other Ambulatory Visit: Payer: Self-pay | Admitting: Psychiatry

## 2024-04-26 DIAGNOSIS — F4310 Post-traumatic stress disorder, unspecified: Secondary | ICD-10-CM

## 2024-04-26 DIAGNOSIS — F411 Generalized anxiety disorder: Secondary | ICD-10-CM

## 2024-04-30 ENCOUNTER — Encounter: Payer: Self-pay | Admitting: Psychiatry

## 2024-04-30 ENCOUNTER — Telehealth: Admitting: Psychiatry

## 2024-04-30 DIAGNOSIS — F5000 Anorexia nervosa, unspecified: Secondary | ICD-10-CM | POA: Diagnosis not present

## 2024-04-30 DIAGNOSIS — F3342 Major depressive disorder, recurrent, in full remission: Secondary | ICD-10-CM

## 2024-04-30 DIAGNOSIS — F4310 Post-traumatic stress disorder, unspecified: Secondary | ICD-10-CM | POA: Diagnosis not present

## 2024-04-30 DIAGNOSIS — F411 Generalized anxiety disorder: Secondary | ICD-10-CM | POA: Diagnosis not present

## 2024-04-30 DIAGNOSIS — F5101 Primary insomnia: Secondary | ICD-10-CM

## 2024-04-30 DIAGNOSIS — R41840 Attention and concentration deficit: Secondary | ICD-10-CM

## 2024-04-30 MED ORDER — HYDROXYZINE HCL 25 MG PO TABS
25.0000 mg | ORAL_TABLET | Freq: Two times a day (BID) | ORAL | 5 refills | Status: AC | PRN
Start: 2024-04-30 — End: ?

## 2024-04-30 MED ORDER — BUSPIRONE HCL 15 MG PO TABS
15.0000 mg | ORAL_TABLET | Freq: Three times a day (TID) | ORAL | 5 refills | Status: AC
Start: 2024-04-30 — End: ?

## 2024-04-30 NOTE — Progress Notes (Signed)
 Virtual Visit via Video Note  I connected with Latasha [name] on 04/30/24 at 11:20 AM EDT by a video enabled telemedicine application and verified that I am speaking with the correct person using two identifiers.  Location Provider Location : [facility] Patient Location : Home  Participants: Patient , Provider   I discussed the limitations of evaluation and management by telemedicine and the availability of in person appointments. The patient expressed understanding and agreed to proceed.  I discussed the assessment and treatment plan with the patient. The patient was provided an opportunity to ask questions and all were answered. The patient agreed with the plan and demonstrated an understanding of the instructions.   The patient was advised to call back or seek an in-person evaluation if the symptoms worsen or if the condition fails to improve as anticipated.    [facility] MD OP Progress Note  04/30/2024 2:28 PM LINZEY RAMSER  MRN:  [id number]  Chief Complaint:  Chief Complaint  Patient presents with   Follow-up   Anxiety   Eating Disorder   Medication Refill   Discussed the use of AI scribe software for clinical note transcription with the patient, who gave verbal consent to proceed.  History of Present Illness Latasha [name] is a 38 year old Caucasian female on SSD, lives in  [place], has a history of GAD, MDD, PTSD, anorexia nervosa, primary insomnia, Hirschsprung's disease, iron  deficiency anemia, chronic constipation, chronic fatigue syndrome was evaluated by telemedicine today.  She reports ongoing stress related to her son's recent hospitalization, which she finds particularly challenging due to her PTSD, as hospital settings are a trigger. She finds this experience particularly challenging due to PTSD, which hospital settings trigger. Significant psychosocial stressors include her son's illness and her own recent experience with COVID-19 and long COVID symptoms.  She  states that her anxiety and mood symptoms remain manageable, though she notes increased stress and fatigue. She maintains her weight and manages eating behaviors, and she acknowledges a tendency to restrict food intake during periods of heightened stress but reports that she is currently able to manage these urges.   She describes more disrupted sleep and increased tiredness, expressing uncertainty about whether this results from stress or medical factors. She recently underwent a sleep study. Her current medications include Ambien  for sleep, Lexapro  20 mg, Buspar  15 mg three times daily, and hydroxyzine . She confirms that she has not had any recent changes to her medication regimen.  She describes new difficulties with attention, focus, and task completion, noting a longstanding pattern of hyperactivity and rapid speech. She remains uncertain about whether these symptoms relate to ADHD, stress, or other factors, and she reports that both of her sons have ADHD.  She denies suicidal ideation or thoughts of harming others.     Visit Diagnosis:    ICD-10-CM   1. GAD (generalized anxiety disorder)  F41.1 busPIRone  (BUSPAR ) 15 MG tablet    hydrOXYzine  (ATARAX ) 25 MG tablet    2. MDD (major depressive disorder), recurrent, in full remission (HCC)  F33.42     3. PTSD (post-traumatic stress disorder)  F43.10     4. Anorexia nervosa  F50.00     5. Primary insomnia  F51.01     6. Attention and concentration deficit  R41.840 Ambulatory referral to Neuropsychology      Past Psychiatric History: I have reviewed past psychiatric history from progress note on 09/26/2020.  Past trials of Seroquel , Wellbutrin, BuSpar , Prozac, Paxil, Xanax, Effexor, Lexapro , Zoloft , Cymbalta  Past Medical History:  Past Medical History:  Diagnosis Date   Acid reflux    Anemia    Anxiety    Asthma    Chronic constipation    Hirschsprung disease    Pelvic floor dysfunction    Syncope, cardiogenic     Past Surgical  History:  Procedure Laterality Date   abdomial adhesions removed  08/19/2010   ABDOMINAL HYSTERECTOMY     APPENDECTOMY  08/20/1995   bladder repair surgery  08/20/1995   c- sections  7986,7985   enodmetiosis removed  08/19/2010   OVARIAN CYST REMOVAL  2003, 2012   TONSILLECTOMY  08/19/2005    Family Psychiatric History: I have reviewed family psychiatric history from progress note on 09/26/2020.  Family History:  Family History  Problem Relation Age of Onset   Cancer Mother    Diabetes Mother    Hypertension Mother    Cancer Father    Alcohol abuse Father    Depression Father    Cancer Maternal Grandfather    Factor IX deficiency Maternal Grandfather    Factor IX deficiency Son    ADD / ADHD Son    Autism Son    Kidney cancer Neg Hx    Bladder Cancer Neg Hx     Social History: I have reviewed social history from progress note on 09/26/2020. Social History   Socioeconomic History   Marital status: Single    Spouse name: Not on file   Number of children: 2   Years of education: 12 th grade, some college   Highest education level: Not on file  Occupational History   Occupation: disabled  Tobacco Use   Smoking status: Never   Smokeless tobacco: Never  Substance and Sexual Activity   Alcohol use: No   Drug use: No   Sexual activity: Yes    Partners: Male    Birth control/protection: Condom, Pill  Other Topics Concern   Not on file  Social History Narrative   Not on file   Social Drivers of Health   Financial Resource Strain: Low Risk  (07/31/2023)   Received from [facility]   Overall Financial Resource Strain (CARDIA)    Difficulty of Paying Living Expenses: Not very hard  Food Insecurity: No Food Insecurity (07/31/2023)   Received from [facility]   Hunger Vital Sign    Within the past 12 months, you worried that your food would run out before you got the money to buy more.: Never true    Within the past 12 months, the  food you bought just didn't last and you didn't have money to get more.: Never true  Transportation Needs: No Transportation Needs (07/31/2023)   Received from [facility] - Transportation    In the past 12 months, has lack of transportation kept you from medical appointments or from getting medications?: No    Lack of Transportation (Non-Medical): No  Physical Activity: Not on file  Stress: Not on file  Social Connections: Not on file    Allergies:  Allergies  Allergen Reactions   Meperidine Anaphylaxis    Other reaction(s): Other (See Comments) Other Reaction: Not Assessed   Lactose     Other reaction(s): Other (See Comments) Other Reaction: GI Upset   Fludrocortisone Hives   Levofloxacin Hives   Sulfamethoxazole-Trimethoprim Other (See Comments)    Other reaction(s): Dizziness    Metabolic Disorder Labs: No results found for: HGBA1C, MPG No results found  for: PROLACTIN No results found for: CHOL, TRIG, HDL, CHOLHDL, VLDL, LDLCALC Lab Results  Component Value Date   TSH 1.178 12/17/2022   TSH 1.276 10/30/2020    Therapeutic Level Labs: No results found for: LITHIUM No results found for: VALPROATE No results found for: CBMZ  Current Medications: Current Outpatient Medications  Medication Sig Dispense Refill   nitrofurantoin, macrocrystal-monohydrate, (MACROBID) 100 MG capsule Take 100 mg by mouth daily.     Acidophilus Lactobacillus CAPS Take 1 tablet by mouth daily.     albuterol (VENTOLIN HFA) 108 (90 Base) MCG/ACT inhaler Inhale 1 puff into the lungs every 6 (six) hours as needed for wheezing or shortness of breath.      busPIRone  (BUSPAR ) 15 MG tablet Take 1 tablet (15 mg total) by mouth 3 (three) times daily. 90 tablet 5   EPINEPHrine (EPIPEN 2-PAK) 0.3 mg/0.3 mL IJ SOAJ injection Inject 0.3 mg into the muscle as needed. Reported on 02/07/2016     escitalopram  (LEXAPRO ) 20 MG tablet TAKE 1 TABLET(20 MG) BY MOUTH  DAILY 30 tablet 5   fluconazole  (DIFLUCAN ) 150 MG tablet Take 150 mg by mouth once.     fluticasone (FLONASE) 50 MCG/ACT nasal spray SHAKE LQ AND U 2 SPRAYS IEN QD     hydrOXYzine  (ATARAX ) 25 MG tablet Take 1 tablet (25 mg total) by mouth 2 (two) times daily as needed. Severe anxiety attacks only 60 tablet 5   midodrine (PROAMATINE) 10 MG tablet Take 10 mg by mouth daily.      ondansetron (ZOFRAN) 4 MG tablet Take 4 mg by mouth every 8 (eight) hours as needed for nausea.     promethazine (PHENERGAN) 25 MG tablet Take 25 mg by mouth every 8 (eight) hours as needed for vomiting.     Sodium Phosphates (RA ENEMA) 7-19 GM/118ML ENEM Place rectally as needed (constipation).     zolpidem  (AMBIEN ) 5 MG tablet Take 1 tablet (5 mg total) by mouth at bedtime as needed for sleep. 30 tablet 3   No current facility-administered medications for this visit.   Facility-Administered Medications Ordered in Other Visits  Medication Dose Route Frequency Provider Last Rate Last Admin   0.9 %  sodium chloride  infusion   Intravenous Continuous Rao, Archana C, MD   Stopped at 05/09/21 1055   0.9 %  sodium chloride  infusion   Intravenous Continuous Geofm Delon BRAVO, NP   Stopped at 05/11/21 1051   0.9 %  sodium chloride  infusion   Intravenous Continuous Melanee Annah BROCKS, MD 10 mL/hr at 05/16/21 1015 New Bag at 05/16/21 1015     Musculoskeletal: Strength & Muscle Tone: UTA Gait & Station: Seated Patient leans: N/A  Psychiatric Specialty Exam: Review of Systems  Psychiatric/Behavioral:  Positive for decreased concentration. The patient is nervous/anxious.     Last menstrual period 05/06/2017.There is no height or weight on file to calculate BMI.  General Appearance: Casual  Eye Contact:  Fair  Speech:  Normal Rate  Volume:  Normal  Mood:  Anxious  Affect:  Appropriate  Thought Process:  Goal Directed and Descriptions of Associations: Intact  Orientation:  Full (Time, Place, and Person)  Thought Content:  Logical   Suicidal Thoughts:  No  Homicidal Thoughts:  No  Memory:  Immediate;   Fair Recent;   Fair Remote;   Fair  Judgement:  Fair  Insight:  Fair  Psychomotor Activity:  Normal  Concentration:  Concentration: Fair and Attention Span: Fair  Recall:  [employer] of  Knowledge: Fair  Language: Fair  Akathisia:  No  Handed:  Right  AIMS (if indicated): not done  Assets:  Communication Skills Desire for Improvement Housing Social Support  ADL's:  Intact  Cognition: WNL  Sleep:  Fair   Screenings: AIMS    Flowsheet Row Video Visit from 01/02/2022 in [facility]  AIMS Total Score 0   GAD-7    Flowsheet Row Office Visit from 11/20/2023 in [facility] Office Visit from 08/19/2023 in [facility] Office Visit from 04/03/2023 in [facility] Video Visit from 03/04/2023 in [facility] Office Visit from 10/03/2022 in [facility]  Total GAD-7 Score 11 10 5 6 9    PHQ2-9    Flowsheet Row Office Visit from 11/20/2023 in [facility] Office Visit from 08/19/2023 in [facility] Office Visit from 04/03/2023 in [facility] Video Visit from 03/04/2023 in [facility] Office Visit from 10/03/2022 in [facility]  PHQ-2 Total Score 4 2 2  0 3  PHQ-9 Total Score 12 7 5  -- 8   Flowsheet Row Video Visit from 04/30/2024 in [facility] Video Visit from 01/27/2024 in [facility] Office Visit from 11/20/2023 in [facility]  C-SSRS RISK CATEGORY No Risk No Risk  No Risk     Assessment and Plan: TERISHA LOSASSO is a 38 year old Caucasian female who has a history of MDD, GAD, PTSD, anorexia nervosa was evaluated by telemedicine today.  Discussed assessment and plan as noted below.  MDD in remission Currently reports depression is stable on the current medication regimen. Continue Lexapro  20 mg daily  Generalized anxiety disorder-unstable Current anxiety mostly situational due to her health issues and her son who is currently hospitalized. Patient encouraged to start psychotherapy session, provided information for the [facility] last visit. Continue Lexapro  20 mg daily Continue BuSpar  15 mg 3 times a day.  PTSD-improving Currently does have situational stressors which trigger her trauma-related symptoms although reports she has been managing her symptoms. Will benefit from reestablishing care with the therapist, patient motivated to do so. Continue Hydroxyzine  25 mg twice a day as needed  Anorexia nervosa-improving Currently denies any significant concerns with her eating disorder. Continue to monitor calorie intake.  Insomnia-stable Does report excessive fatigue mostly because of recent COVID-19 infection.  Otherwise Ambien  has been beneficial Continue Ambien  5 mg at bedtime as needed Reviewed [place] PMP AWARxE  Attention and concentration deficit-Ambulatory referral to neuropsychology based on patient request to rule out ADHD.  Follow-up Follow-up in clinic in 2 months or sooner if needed.   Collaboration of Care: Collaboration of Care: Referral or follow-up with counselor/therapist AEB patient provided resources referred to the [facility] for psychotherapy sessions.  Patient/Guardian was advised Release of Information must be obtained prior to any record release in order to collaborate their care with an outside provider. Patient/Guardian was advised if they have not already done so to contact the registration department to sign all  necessary forms in order for us  to release information regarding their care.   Consent: Patient/Guardian gives verbal consent for treatment and assignment of benefits for services provided during this visit. Patient/Guardian expressed understanding and agreed to proceed.   This note was  generated in part or whole with voice recognition software. Voice recognition is usually quite accurate but there are transcription errors that can and very often do occur. I apologize for any typographical errors that were not detected and corrected.    Shawntrice Salle, MD 04/30/2024, 2:28 PM

## 2024-05-04 NOTE — Progress Notes (Signed)
 Yes it is recurrent

## 2024-05-10 ENCOUNTER — Encounter: Payer: Self-pay | Admitting: Psychology

## 2024-06-03 ENCOUNTER — Telehealth: Payer: Self-pay

## 2024-06-03 DIAGNOSIS — F5101 Primary insomnia: Secondary | ICD-10-CM

## 2024-06-03 MED ORDER — ZOLPIDEM TARTRATE 5 MG PO TABS: 5.0000 mg | ORAL_TABLET | Freq: Every evening | ORAL | 3 refills | Status: AC | PRN

## 2024-06-03 NOTE — Telephone Encounter (Signed)
I have sent Ambien to pharmacy as requested.

## 2024-06-03 NOTE — Telephone Encounter (Signed)
 Medication refill - Call message from patient requesting a new Ambien  order, last provided 11/20/23 with 3 refills. Pt last seen 04/30/24 and returns next on 07/06/24. Medication was continued PRN at last evaluation. Requests a new order be sent into her [facility] in [place], [place].

## 2024-07-06 ENCOUNTER — Telehealth: Admitting: Psychiatry

## 2024-07-06 ENCOUNTER — Telehealth (HOSPITAL_COMMUNITY): Payer: Self-pay

## 2024-07-06 ENCOUNTER — Encounter: Payer: Self-pay | Admitting: Internal Medicine

## 2024-07-06 ENCOUNTER — Encounter: Payer: Self-pay | Admitting: Psychiatry

## 2024-07-06 DIAGNOSIS — F411 Generalized anxiety disorder: Secondary | ICD-10-CM | POA: Diagnosis not present

## 2024-07-06 DIAGNOSIS — F5101 Primary insomnia: Secondary | ICD-10-CM

## 2024-07-06 DIAGNOSIS — F4310 Post-traumatic stress disorder, unspecified: Secondary | ICD-10-CM

## 2024-07-06 DIAGNOSIS — F5000 Anorexia nervosa, unspecified: Secondary | ICD-10-CM | POA: Diagnosis not present

## 2024-07-06 DIAGNOSIS — F331 Major depressive disorder, recurrent, moderate: Secondary | ICD-10-CM | POA: Diagnosis not present

## 2024-07-06 NOTE — Progress Notes (Signed)
 Virtual Visit via Video Note  I connected with Latasha [name] on 07/06/24 at 11:40 AM EST by a video enabled telemedicine application and verified that I am speaking with the correct person using two identifiers.  Location Provider Location : [facility] Patient Location : Home  Participants: Patient , Provider   I discussed the limitations of evaluation and management by telemedicine and the availability of in person appointments. The patient expressed understanding and agreed to proceed.   I discussed the assessment and treatment plan with the patient. The patient was provided an opportunity to ask questions and all were answered. The patient agreed with the plan and demonstrated an understanding of the instructions.   The patient was advised to call back or seek an in-person evaluation if the symptoms worsen or if the condition fails to improve as anticipated.   BH MD OP Progress Note  07/06/2024 11:56 AM Latasha [name]  MRN:  [id number]  Chief Complaint:  Chief Complaint  Patient presents with   Follow-up   Medication Refill   Anxiety   Depression   Discussed the use of AI scribe software for clinical note transcription with the patient, who gave verbal consent to proceed.  History of Present Illness Latasha [name] is a 38 year old Caucasian female on SSD lives in [place] has a history of GAD, MDD, PTSD, anorexia nervosa, primary insomnia, Hirschsprung's disease, iron  deficiency anemia, chronic constipation, chronic fatigue syndrome was evaluated by telemedicine today.  Over the past 8 months, she has experienced significant daytime episodes of fatigue and excessive sleepiness, describing these as narcolepsy-like symptoms. She states that she suddenly falls asleep, even during conversations or while sitting. She reports that these episodes have led her to avoid driving long distances and to remain at home, and she feels that this has resulted in social isolation and  decreased quality of life. She describes her nighttime sleep as good but reports sleeping too much overall, with episodes occurring daily. She also notes a history of similar symptoms dating back to high school, with periods of worsening and improvement, and states that her current episode has persisted for 8 months without improvement. She is currently awaiting a sleep evaluation scheduled for 2026 and has a follow-up with her primary care provider to discuss interim management.  She reports that feelings of depression and anxiety, which she relates to her ongoing health issues and their impact on daily functioning, continue to affect her. She endorses low interest or pleasure in activities , feeling down or hopeless , sleeping too much every day, and feeling tired or having little energy every day. She reports that these symptoms have made it very difficult for her to do work, take care of things at home, or get along with others. She denies poor appetite, overeating, feelings of worthlessness, or psychomotor changes. She reports occasional trouble concentrating. She denies current or past suicidal ideation or thoughts of harming others.  She reports ongoing struggles with eating disorder symptoms and states that these continue to affect her.  She has a history of anorexia, restricting food.  She expresses a desire to return to therapy at the [facility] but notes transportation barriers due to distance.  She agrees to contact the program for virtual options.  She reports experiencing flashbacks, intrusive memories, and nightmares, but states that these do not occur all the time and are currently manageable.  Her current medication regimen includes Lexapro  at night, Buspar  twice daily (1 in the morning and 2 at  night). She states that she takes Ambien  and hydroxyzine  only occasionally. She does not believe her current medication regimen contributes to her daytime sleepiness.  She is not interested in  further medication management at this time since medications comes with side effects of sleepiness, drowsiness.  She is interested in a referral for transcranial magnetic stimulation. Will    Visit Diagnosis:    ICD-10-CM   1. GAD (generalized anxiety disorder)  F41.1     2. MDD (major depressive disorder), recurrent episode, moderate (HCC)  F33.1     3. PTSD (post-traumatic stress disorder)  F43.10     4. Anorexia nervosa (HCC)  F50.00     5. Primary insomnia  F51.01       Past Psychiatric History: I have reviewed past psychiatric history from progress note on 09/26/2020.  Past trials of Seroquel , Wellbutrin, BuSpar , Prozac, Paxil, Xanax, Effexor, Lexapro , Zoloft , Cymbalta  Past Medical History:  Past Medical History:  Diagnosis Date   Acid reflux    Anemia    Anxiety    Asthma    Chronic constipation    Hirschsprung disease    Pelvic floor dysfunction    Syncope, cardiogenic     Past Surgical History:  Procedure Laterality Date   abdomial adhesions removed  08/19/2010   ABDOMINAL HYSTERECTOMY     APPENDECTOMY  08/20/1995   bladder repair surgery  08/20/1995   c- sections  7986,7985   enodmetiosis removed  08/19/2010   OVARIAN CYST REMOVAL  2003, 2012   TONSILLECTOMY  08/19/2005    Family Psychiatric History: I have reviewed family psychiatric history from progress note on 09/26/2020  Family History:  Family History  Problem Relation Age of Onset   Cancer Mother    Diabetes Mother    Hypertension Mother    Cancer Father    Alcohol abuse Father    Depression Father    Cancer Maternal Grandfather    Factor IX deficiency Maternal Grandfather    Factor IX deficiency Son    ADD / ADHD Son    Autism Son    Kidney cancer Neg Hx    Bladder Cancer Neg Hx     Social History: I have reviewed social history from progress note on 09/26/2020 Social History   Socioeconomic History   Marital status: Single    Spouse name: Not on file   Number of children: 2   Years  of education: 12 th grade, some college   Highest education level: Not on file  Occupational History   Occupation: disabled  Tobacco Use   Smoking status: Never   Smokeless tobacco: Never  Substance and Sexual Activity   Alcohol use: No   Drug use: No   Sexual activity: Yes    Partners: Male    Birth control/protection: Condom, Pill  Other Topics Concern   Not on file  Social History Narrative   Not on file   Social Drivers of Health   Financial Resource Strain: Low Risk  (07/31/2023)   Received from [facility]   Overall Financial Resource Strain (CARDIA)    Difficulty of Paying Living Expenses: Not very hard  Food Insecurity: No Food Insecurity (07/31/2023)   Received from [facility]   Hunger Vital Sign    Within the past 12 months, you worried that your food would run out before you got the money to buy more.: Never true    Within the past 12 months, the food you bought just  didn't last and you didn't have money to get more.: Never true  Transportation Needs: No Transportation Needs (07/31/2023)   Received from [facility]    In the past 12 months, has lack of transportation kept you from medical appointments or from getting medications?: No    Lack of Transportation (Non-Medical): No  Physical Activity: Not on file  Stress: Not on file  Social Connections: Not on file    Allergies:  Allergies  Allergen Reactions   Meperidine Anaphylaxis    Other reaction(s): Other (See Comments) Other Reaction: Not Assessed   Lactose     Other reaction(s): Other (See Comments) Other Reaction: GI Upset   Fludrocortisone Hives   Levofloxacin Hives   Sulfamethoxazole-Trimethoprim Other (See Comments)    Other reaction(s): Dizziness    Metabolic Disorder Labs: No results found for: HGBA1C, MPG No results found for: PROLACTIN No results found for: CHOL, TRIG, HDL, CHOLHDL, VLDL,  LDLCALC Lab Results  Component Value Date   TSH 1.178 12/17/2022   TSH 1.276 10/30/2020    Therapeutic Level Labs: No results found for: LITHIUM No results found for: VALPROATE No results found for: CBMZ  Current Medications: Current Outpatient Medications  Medication Sig Dispense Refill   methenamine (HIPREX) 1 g tablet Take 1 g by mouth.     ondansetron (ZOFRAN-ODT) 4 MG disintegrating tablet Take 2 mg by mouth every 6 (six) hours as needed.     Acidophilus Lactobacillus CAPS Take 1 tablet by mouth daily.     albuterol (VENTOLIN HFA) 108 (90 Base) MCG/ACT inhaler Inhale 1 puff into the lungs every 6 (six) hours as needed for wheezing or shortness of breath.      busPIRone  (BUSPAR ) 15 MG tablet Take 1 tablet (15 mg total) by mouth 3 (three) times daily. 90 tablet 5   EPINEPHrine (EPIPEN 2-PAK) 0.3 mg/0.3 mL IJ SOAJ injection Inject 0.3 mg into the muscle as needed. Reported on 02/07/2016     escitalopram  (LEXAPRO ) 20 MG tablet TAKE 1 TABLET(20 MG) BY MOUTH DAILY 30 tablet 5   fluconazole  (DIFLUCAN ) 150 MG tablet Take 150 mg by mouth once.     fluticasone (FLONASE) 50 MCG/ACT nasal spray SHAKE LQ AND U 2 SPRAYS IEN QD     hydrOXYzine  (ATARAX ) 25 MG tablet Take 1 tablet (25 mg total) by mouth 2 (two) times daily as needed. Severe anxiety attacks only 60 tablet 5   midodrine (PROAMATINE) 10 MG tablet Take 10 mg by mouth daily.      nitrofurantoin, macrocrystal-monohydrate, (MACROBID) 100 MG capsule Take 100 mg by mouth daily.     ondansetron (ZOFRAN) 4 MG tablet Take 4 mg by mouth every 8 (eight) hours as needed for nausea.     promethazine (PHENERGAN) 25 MG tablet Take 25 mg by mouth every 8 (eight) hours as needed for vomiting.     Sodium Phosphates (RA ENEMA) 7-19 GM/118ML ENEM Place rectally as needed (constipation).     zolpidem  (AMBIEN ) 5 MG tablet Take 1 tablet (5 mg total) by mouth at bedtime as needed for sleep. 30 tablet 3   No current facility-administered medications  for this visit.   Facility-Administered Medications Ordered in Other Visits  Medication Dose Route Frequency Provider Last Rate Last Admin   0.9 %  sodium chloride  infusion   Intravenous Continuous Melanee Annah BROCKS, MD   Stopped at 05/09/21 1055   0.9 %  sodium chloride  infusion   Intravenous Continuous Geofm Delon BRAVO,  NP   Stopped at 05/11/21 1051   0.9 %  sodium chloride  infusion   Intravenous Continuous Rao, Archana C, MD 10 mL/hr at 05/16/21 1015 New Bag at 05/16/21 1015     Musculoskeletal: Strength & Muscle Tone: UTA Gait & Station: Seated Patient leans: N/A  Psychiatric Specialty Exam: Review of Systems  Psychiatric/Behavioral:  Positive for dysphoric mood and sleep disturbance. The patient is nervous/anxious.     Last menstrual period 05/06/2017.There is no height or weight on file to calculate BMI.  General Appearance: Casual  Eye Contact:  Fair  Speech:  Clear and Coherent  Volume:  Normal  Mood:  Anxious and Depressed  Affect:  Congruent  Thought Process:  Goal Directed and Descriptions of Associations: Intact  Orientation:  Full (Time, Place, and Person)  Thought Content: Logical   Suicidal Thoughts:  No  Homicidal Thoughts:  No  Memory:  Immediate;   Fair Recent;   Fair Remote;   Fair  Judgement:  Fair  Insight:  Fair  Psychomotor Activity:  Normal  Concentration:  Concentration: Fair and Attention Span: Fair  Recall:  [employer] of Knowledge: Fair  Language: Fair  Akathisia:  No  Handed:  Right  AIMS (if indicated): not done  Assets:  Communication Skills Desire for Improvement Housing Social Support  ADL's:  Intact  Cognition: WNL  Sleep:  Excessive   Screenings: AIMS    Flowsheet Row Video Visit from 01/02/2022 in [facility]  AIMS Total Score 0   GAD-7    Flowsheet Row Office Visit from 11/20/2023 in [facility] Office Visit from 08/19/2023 in [facility] Office Visit from 04/03/2023 in [facility] Video Visit from 03/04/2023 in [facility] Office Visit from 10/03/2022 in [facility]  Total GAD-7 Score 11 10 5 6 9    PHQ2-9    Flowsheet Row Video Visit from 07/06/2024 in [facility] Office Visit from 11/20/2023 in [facility] Office Visit from 08/19/2023 in [facility] Office Visit from 04/03/2023 in [facility] Video Visit from 03/04/2023 in [facility]  PHQ-2 Total Score 3 4 2 2  0  PHQ-9 Total Score 10 12 7 5  --   Flowsheet Row Video Visit from 07/06/2024 in [facility] Video Visit from 04/30/2024 in [facility] Video Visit from 01/27/2024 in [facility]  C-SSRS RISK CATEGORY No Risk No Risk No Risk     Assessment and Plan: Latasha [name] is a 38 year old Caucasian female who presented for a follow-up appointment, discussed assessment and plan as noted below.  1. GAD (generalized anxiety disorder)-some improvement Currently worried about her current health issues although managing anxiety. Continue Lexapro  20 mg daily  2. MDD (major depressive disorder), recurrent episode, moderate (HCC)-unstable Currently reports worsening depression symptoms.  Not interested in further medication management due to concerns about side effects of excessive drowsiness, sleepiness. Referral for transcranial magnetic stimulation, I have referred patient to [facility]. Continue Lexapro  20 mg daily Continue BuSpar  45 mg daily in divided dosage.  3. PTSD (post-traumatic  stress disorder)-improving Currently denies any significant flashbacks nightmares or intrusive memories. Patient advised to reestablish care with therapist Continue hydroxyzine  25 mg twice a  day as needed  4. Anorexia nervosa (HCC)-unstable Reports she struggles with food restriction on and off.  She is interested in reestablishing care with therapist however is currently looking for virtual options. Patient advised to continue to monitor calorie intake. Restart psychotherapy sessions  5. Primary insomnia-unstable Currently sleep is overall good at night however continues to struggle with excessive fatigue, sleepiness during the day.  Excessive fatigue and sleepiness may have worsened since recent COVID-19 infection. Continue Ambien  5 mg at bedtime as needed Patient currently awaiting consultation for sleep disorder has upcoming appointment.  Follow-up Follow-up in clinic in 2 to 3 weeks or sooner if needed.    Collaboration of Care: Collaboration of Care: Other patient has been referred for TMS consultation.  Patient/Guardian was advised Release of Information must be obtained prior to any record release in order to collaborate their care with an outside provider. Patient/Guardian was advised if they have not already done so to contact the registration department to sign all necessary forms in order for us  to release information regarding their care.   Consent: Patient/Guardian gives verbal consent for treatment and assignment of benefits for services provided during this visit. Patient/Guardian expressed understanding and agreed to proceed.   This note was generated in part or whole with voice recognition software. Voice recognition is usually quite accurate but there are transcription errors that can and very often do occur. I apologize for any typographical errors that were not detected and corrected.    Katilin Raynes, MD 07/06/2024, 11:56 AM

## 2024-07-06 NOTE — Telephone Encounter (Signed)
 Referral received from pt's primary outpatient provider ([facility]). After review of the pt's chart and insurance coverage, Coordinator responded to provider to let them know that unfortunately pt won't be eligible for TMS since primary coverages [facility] & Medicare A/B) won't cover the service.

## 2024-07-21 ENCOUNTER — Telehealth: Admitting: Psychiatry

## 2024-08-04 ENCOUNTER — Telehealth: Admitting: Psychiatry

## 2024-08-04 ENCOUNTER — Encounter: Payer: Self-pay | Admitting: Psychiatry

## 2024-08-04 DIAGNOSIS — F331 Major depressive disorder, recurrent, moderate: Secondary | ICD-10-CM

## 2024-08-04 DIAGNOSIS — F5101 Primary insomnia: Secondary | ICD-10-CM

## 2024-08-04 DIAGNOSIS — F4310 Post-traumatic stress disorder, unspecified: Secondary | ICD-10-CM

## 2024-08-04 DIAGNOSIS — F411 Generalized anxiety disorder: Secondary | ICD-10-CM

## 2024-08-04 DIAGNOSIS — F5000 Anorexia nervosa, unspecified: Secondary | ICD-10-CM

## 2024-08-04 NOTE — Progress Notes (Unsigned)
 Virtual Visit via Video Note  I connected with Latasha [name] on 08/04/2024 at  3:00 PM EST by a video enabled telemedicine application and verified that I am speaking with the correct person using two identifiers.  Location Provider Location : [facility] Patient Location : Home  Participants: Patient , Provider    I discussed the limitations of evaluation and management by telemedicine and the availability of in person appointments. The patient expressed understanding and agreed to proceed.   I discussed the assessment and treatment plan with the patient. The patient was provided an opportunity to ask questions and all were answered. The patient agreed with the plan and demonstrated an understanding of the instructions.   The patient was advised to call back or seek an in-person evaluation if the symptoms worsen or if the condition fails to improve as anticipated.  [facility] MD OP Progress Note  08/04/2024 3:15 PM Latasha [name]  MRN:  [id number]  Chief Complaint:  Chief Complaint  Patient presents with   Medication Refill   Follow-up   Anxiety   Depression   Eating Disorder   Discussed the use of AI scribe software for clinical note transcription with the patient, who gave verbal consent to proceed.  History of Present Illness Latasha [name] is a 38 year old Caucasian female on SSD, lives in [place], has a history of GAD, MDD, PTSD, anorexia nervosa, primary insomnia, Hirschsprung's disease, iron  deficiency anemia, chronic constipation, chronic fatigue syndrome was evaluated by telemedicine today.  Persistent excessive daytime sleepiness and episodes of sudden fatigue have significantly impacted her daily functioning. She reports falling asleep during activities such as watching movies with her children, attending church, and driving, which limits her ability to drive more than 15 to 20 minutes at a time. She states that these symptoms began in high school following a diagnosis of  mononucleosis and have continued intermittently since then.  Fatigue and sleepiness have led her to experience increased feelings of depression and frustration, describing her experience as 'sleeping her life away.' She denies any suicidal thoughts. She reports sleeping well at night and has not needed to use Ambien  as frequently, but feels she is sleeping excessively overall.  Ongoing struggles with eating and weight concerns persist, and she notes that depressive symptoms sometimes lead to restrictive eating behaviors. She reports improvement compared to previous periods but continues to experience stress related to weight and clothing fit. She has not weighed herself in the past month but states she is eating adequately.  She reports difficulty accessing new medications for her sleepiness due to insurance barriers, specifically mentioning Sunosi which she has not yet started. She continues to work with her primary care physician to address these issues. She is not a candidate for stimulant medications due to her history of eating disorder.  She states she is not currently seeing a therapist due to transportation limitations and has not started transcranial magnetic stimulation (TMS) as her insurance does not cover it.     Visit Diagnosis:    ICD-10-CM   1. GAD (generalized anxiety disorder)  F41.1     2. MDD (major depressive disorder), recurrent episode, moderate (HCC)  F33.1     3. PTSD (post-traumatic stress disorder)  F43.10     4. Anorexia nervosa (HCC)  F50.00     5. Primary insomnia  F51.01       Past Psychiatric History: I have reviewed past psychiatric history from progress note on 09/26/2020.  Past trials of Seroquel , Wellbutrin,  BuSpar , Prozac, Paxil, Xanax, Effexor, Lexapro , Zoloft , Cymbalta.  Past Medical History:  Past Medical History:  Diagnosis Date   Acid reflux    Anemia    Anxiety    Asthma    Chronic constipation    Hirschsprung disease    Pelvic floor  dysfunction    Syncope, cardiogenic     Past Surgical History:  Procedure Laterality Date   abdomial adhesions removed  08/19/2010   ABDOMINAL HYSTERECTOMY     APPENDECTOMY  08/20/1995   bladder repair surgery  08/20/1995   c- sections  7986,7985   enodmetiosis removed  08/19/2010   OVARIAN CYST REMOVAL  2003, 2012   TONSILLECTOMY  08/19/2005    Family Psychiatric History: I have reviewed family psychiatric history from progress note on 09/26/2020.  Family History:  Family History  Problem Relation Age of Onset   Cancer Mother    Diabetes Mother    Hypertension Mother    Cancer Father    Alcohol abuse Father    Depression Father    Cancer Maternal Grandfather    Factor IX deficiency Maternal Grandfather    Factor IX deficiency Son    ADD / ADHD Son    Autism Son    Kidney cancer Neg Hx    Bladder Cancer Neg Hx     Social History: I have reviewed social history from progress note on 09/26/2020. Social History   Socioeconomic History   Marital status: Single    Spouse name: Not on file   Number of children: 2   Years of education: 12 th grade, some college   Highest education level: Not on file  Occupational History   Occupation: disabled  Tobacco Use   Smoking status: Never   Smokeless tobacco: Never  Substance and Sexual Activity   Alcohol use: No   Drug use: No   Sexual activity: Yes    Partners: Male    Birth control/protection: Condom, Pill  Other Topics Concern   Not on file  Social History Narrative   Not on file   Social Drivers of Health   Tobacco Use: Low Risk (08/04/2024)   Patient History    Smoking Tobacco Use: Never    Smokeless Tobacco Use: Never    Passive Exposure: Not on file  Financial Resource Strain: Low Risk  (07/31/2023)   Received from [facility]   Overall Financial Resource Strain (CARDIA)    Difficulty of Paying Living Expenses: Not very hard  Food Insecurity: No Food Insecurity (07/31/2023)   Received from  [facility]   Epic    Within the past 12 months, you worried that your food would run out before you got the money to buy more.: Never true    Within the past 12 months, the food you bought just didn't last and you didn't have money to get more.: Never true  Transportation Needs: No Transportation Needs (07/31/2023)   Received from [facility] - [facility]    In the past 12 months, has lack of transportation kept you from medical appointments or from getting medications?: No    Lack of Transportation (Non-Medical): No  Physical Activity: Not on file  Stress: Not on file  Social Connections: Not on file  Depression (PHQ2-9): Medium Risk (07/06/2024)   Depression (PHQ2-9)    PHQ-2 Score: 10  Alcohol Screen: Not on file  Housing: Unknown (09/19/2023)   Received from [facility]   Epic  In the last 12 months, was there a time when you were not able to pay the mortgage or rent on time?: No    Number of Times Moved in the Last Year: Not on file    At any time in the past 12 months, were you homeless or living in a shelter (including now)?: No  Utilities: Not At Risk (07/31/2023)   Received from [facility] Utilities    Threatened with loss of utilities: No  Health Literacy: Not on file    Allergies: Allergies[1]  Metabolic Disorder Labs: No results found for: HGBA1C, MPG No results found for: PROLACTIN No results found for: CHOL, TRIG, HDL, CHOLHDL, VLDL, LDLCALC Lab Results  Component Value Date   TSH 1.178 12/17/2022   TSH 1.276 10/30/2020    Therapeutic Level Labs: No results found for: LITHIUM No results found for: VALPROATE No results found for: CBMZ  Current Medications: Current Outpatient Medications  Medication Sig Dispense Refill   Solriamfetol HCl 75 MG TABS Take 75 mg by mouth.     Acidophilus Lactobacillus CAPS Take 1 tablet by mouth daily.      albuterol (VENTOLIN HFA) 108 (90 Base) MCG/ACT inhaler Inhale 1 puff into the lungs every 6 (six) hours as needed for wheezing or shortness of breath.      busPIRone  (BUSPAR ) 15 MG tablet Take 1 tablet (15 mg total) by mouth 3 (three) times daily. 90 tablet 5   EPINEPHrine (EPIPEN 2-PAK) 0.3 mg/0.3 mL IJ SOAJ injection Inject 0.3 mg into the muscle as needed. Reported on 02/07/2016     escitalopram  (LEXAPRO ) 20 MG tablet TAKE 1 TABLET(20 MG) BY MOUTH DAILY 30 tablet 5   fluconazole  (DIFLUCAN ) 150 MG tablet Take 150 mg by mouth once.     fluticasone (FLONASE) 50 MCG/ACT nasal spray SHAKE LQ AND U 2 SPRAYS IEN QD     hydrOXYzine  (ATARAX ) 25 MG tablet Take 1 tablet (25 mg total) by mouth 2 (two) times daily as needed. Severe anxiety attacks only 60 tablet 5   methenamine (HIPREX) 1 g tablet Take 1 g by mouth.     midodrine (PROAMATINE) 10 MG tablet Take 10 mg by mouth daily.      nitrofurantoin, macrocrystal-monohydrate, (MACROBID) 100 MG capsule Take 100 mg by mouth daily.     ondansetron (ZOFRAN) 4 MG tablet Take 4 mg by mouth every 8 (eight) hours as needed for nausea.     ondansetron (ZOFRAN-ODT) 4 MG disintegrating tablet Take 2 mg by mouth every 6 (six) hours as needed.     promethazine (PHENERGAN) 25 MG tablet Take 25 mg by mouth every 8 (eight) hours as needed for vomiting.     pyridostigmine (MESTINON) 60 MG tablet Take 30 mg by mouth 3 (three) times daily.     Sodium Phosphates (RA ENEMA) 7-19 GM/118ML ENEM Place rectally as needed (constipation).     zolpidem  (AMBIEN ) 5 MG tablet Take 1 tablet (5 mg total) by mouth at bedtime as needed for sleep. 30 tablet 3   No current facility-administered medications for this visit.   Facility-Administered Medications Ordered in Other Visits  Medication Dose Route Frequency Provider Last Rate Last Admin   0.9 %  sodium chloride  infusion   Intravenous Continuous Melanee Annah BROCKS, MD   Stopped at 05/09/21 1055   0.9 %  sodium chloride  infusion    Intravenous Continuous Geofm Delon BRAVO, NP   Stopped at 05/11/21 1051   0.9 %  sodium  chloride infusion   Intravenous Continuous Melanee Annah BROCKS, MD 10 mL/hr at 05/16/21 1015 New Bag at 05/16/21 1015     Musculoskeletal: Strength & Muscle Tone: UTA Gait & Station: Seated Patient leans: N/A  Psychiatric Specialty Exam: Review of Systems  Psychiatric/Behavioral:  Positive for dysphoric mood and sleep disturbance. The patient is nervous/anxious.     Last menstrual period 05/06/2017.There is no height or weight on file to calculate BMI.  General Appearance: Casual  Eye Contact:  Fair  Speech:  Clear and Coherent  Volume:  Normal  Mood:  Anxious and Depressed  Affect:  Appropriate  Thought Process:  Goal Directed and Descriptions of Associations: Intact  Orientation:  Full (Time, Place, and Person)  Thought Content: Logical   Suicidal Thoughts:  No  Homicidal Thoughts:  No  Memory:  Immediate;   Fair Recent;   Fair Remote;   Fair  Judgement:  Fair  Insight:  Fair  Psychomotor Activity:  Normal  Concentration:  Concentration: Fair and Attention Span: Fair  Recall:  [employer] of Knowledge: Fair  Language: Fair  Akathisia:  No  Handed:  Right  AIMS (if indicated): not done  Assets:  Communication Skills Desire for Improvement Housing Social Support  ADL's:  Intact  Cognition: WNL  Sleep:  Excessive   Screenings: AIMS    Flowsheet Row Video Visit from 01/02/2022 in [facility]  AIMS Total Score 0   GAD-7    Flowsheet Row Office Visit from 11/20/2023 in [facility] Office Visit from 08/19/2023 in [facility] Office Visit from 04/03/2023 in [facility] Video Visit from 03/04/2023 in [facility] Office Visit from 10/03/2022 in [facility]  Total GAD-7 Score 11 10 5 6 9    PHQ2-9    Flowsheet Row Video Visit from 07/06/2024 in [facility] Office Visit from 11/20/2023 in [facility] Office Visit from 08/19/2023 in [facility] Office Visit from 04/03/2023 in [facility] Video Visit from 03/04/2023 in [facility]  PHQ-2 Total Score 3 4 2 2  0  PHQ-9 Total Score 10 12 7 5  --   Flowsheet Row Video Visit from 08/04/2024 in [facility] Video Visit from 07/06/2024 in [facility] Video Visit from 04/30/2024 in [facility]  C-SSRS RISK CATEGORY No Risk No Risk No Risk     Assessment and Plan: Latasha [name] is a 38 year old Caucasian female who presented for a follow-up appointment, discussed assessment and plan as noted below.  1. GAD (generalized anxiety disorder)-improving Continues to have situational anxiety due to current sleep problems although managing anxiety on the current medication regimen Continue Lexapro  20 mg daily  2. MDD (major depressive disorder), recurrent episode, moderate (HCC)-unstable Currently reports depression symptoms due to comorbid sleep problems likely narcolepsy and inability to get her medication approved due to insurance issue.  Not interested in medication changes for depression at this time.  Was referred for TMS however had trouble with insurance approval. Continue Lexapro  20 mg daily Continue BuSpar  45 mg daily divided dosage Patient advised to establish care with therapist.  Was referred to a therapist at the [facility].  3. PTSD (post-traumatic stress disorder)-improving Denies any current intrusive  memories flashbacks or nightmares Continue Hydroxyzine  25 mg  twice a day as needed  4. Anorexia nervosa (HCC)-improving Currently reports improvement in managing diet adequately. Encouraged to establish care with therapist.  5. Primary insomnia-unstable Excessive sleepiness during the day.  Has not been using Ambien  at bedtime and reports sleep at night is good.  Recently prescribed Sunosi by primary care provider pending insurance approval. Encouraged to follow-up with [facility] sleep provider/primary care provider for management. Continue Ambien  5 mg at bedtime as needed, currently not using it.  Follow-up Follow-up in clinic in 8 weeks or sooner in person.     Collaboration of Care: Collaboration of Care: Referral or follow-up with counselor/therapist AEB encouraged to establish care with therapist.  Patient/Guardian was advised Release of Information must be obtained prior to any record release in order to collaborate their care with an outside provider. Patient/Guardian was advised if they have not already done so to contact the registration department to sign all necessary forms in order for us  to release information regarding their care.   Consent: Patient/Guardian gives verbal consent for treatment and assignment of benefits for services provided during this visit. Patient/Guardian expressed understanding and agreed to proceed.  This note was generated in part or whole with voice recognition software. Voice recognition is usually quite accurate but there are transcription errors that can and very often do occur. I apologize for any typographical errors that were not detected and corrected.     Shuntell Foody, MD 08/05/2024, 4:35 PM     [1]  Allergies Allergen Reactions   Meperidine Anaphylaxis    Other reaction(s): Other (See Comments) Other Reaction: Not Assessed   Lactose     Other reaction(s): Other (See Comments) Other Reaction: GI Upset   Fludrocortisone Hives   Levofloxacin Hives   Sulfamethoxazole-Trimethoprim Other (See  Comments)    Other reaction(s): Dizziness

## 2024-09-04 ENCOUNTER — Encounter: Payer: Self-pay | Admitting: Internal Medicine

## 2024-09-22 ENCOUNTER — Encounter: Admitting: Psychology

## 2024-09-22 ENCOUNTER — Encounter: Payer: Self-pay | Admitting: Internal Medicine

## 2024-09-22 DIAGNOSIS — F5101 Primary insomnia: Secondary | ICD-10-CM | POA: Diagnosis not present

## 2024-09-22 DIAGNOSIS — F4310 Post-traumatic stress disorder, unspecified: Secondary | ICD-10-CM

## 2024-09-22 DIAGNOSIS — G90A Postural orthostatic tachycardia syndrome (POTS): Secondary | ICD-10-CM

## 2024-09-22 DIAGNOSIS — F411 Generalized anxiety disorder: Secondary | ICD-10-CM

## 2024-09-22 DIAGNOSIS — F410 Panic disorder [episodic paroxysmal anxiety] without agoraphobia: Secondary | ICD-10-CM | POA: Diagnosis not present

## 2024-09-22 DIAGNOSIS — F331 Major depressive disorder, recurrent, moderate: Secondary | ICD-10-CM | POA: Diagnosis not present

## 2024-09-22 DIAGNOSIS — R41840 Attention and concentration deficit: Secondary | ICD-10-CM

## 2024-09-23 ENCOUNTER — Encounter: Payer: Self-pay | Admitting: Psychology

## 2024-09-23 NOTE — Progress Notes (Signed)
 Neuropsychological Consultation   Patient:   Latasha [name]   DOB:   1985/08/29  MR Number:  [id number]  Location:  [facility] FOR PAIN AND REHABILITATIVE MEDICINE [facility] PHYSICAL MEDICINE AND REHABILITATION [address] [place], [address] [place] [address] Dept: [phone]           Date of Service:   09/22/2024  Location of Service and Individuals present: Today's visit was conducted in my outpatient clinic office with the patient myself present  Start Time:   8 AM End Time:   10 AM  1 hour and 20 minutes was spent in face-to-face clinical interview and the other 45 minutes was spent with record review, report writing and setting up testing protocols.  Patient Consent and Confidentiality: Limits of confidentiality were reviewed including the fact that the patient had been referred for neuropsychological evaluation and what that would entail including the production of a formal neuropsychological evaluative report to be provided to her referring psychiatrist Dr. Eappen as well as being made available in the patient's electronic medical records for other appropriate medical professionals to have access to.  The patient consents to proceed with the evaluation.  A digital scribe was also utilized today to assist in note taking and this process was explained to the patient who consented to allow a digital scribe to be utilized in this capacity.  The system is HIPAA compliant.  Consent for Evaluation and Treatment:  Signed:  Yes Explanation of Privacy Policies:  Signed:  Yes Discussion of Confidentiality Limits:  Yes  Provider/Observer:  Norleen Asa, Psy.D.       Clinical Neuropsychologist       Billing Code/Service: 96116/96121  Chief Complaint:     Chief Complaint  Patient presents with   Anxiety   Depression   Post-Traumatic Stress Disorder   Panic Attack   Stress   Sleeping Problem   Other    Attention and concentration difficulties    Reason for  Service:    Latasha [name] is a 39 year old female referred for neuropsychological evaluation due to concerns regarding attention, concentration, and memory. She reports persistent difficulties with attention and fatigue, which she has experienced since high school following a mononucleosis diagnosis, which was later labeled as chronic fatigue syndrome. She describes episodes of automatic sleep onset, particularly during passive activities like watching movies or driving, which has led to her avoiding long-distance driving. A sleep study was negative for sleep apnea. Her primary care physician has suggested narcolepsy as a possibility, and she is currently on Sunosi, which has provided some benefit, though she feels the dose may not be optimal. She has an appointment scheduled with a sleep clinic at [facility] in October 2026. She also reports symptoms of anxiety, which are typically situational and stressor-based. She has a history of PTSD stemming from significant medical trauma at age 86, involving a cyst on her ovary, appendectomy, and subsequent peritonitis and sepsis from a bladder perforation during surgery. This has led to avoidance behaviors, including avoiding hospitals and delaying treatment for UTIs. She reports a history of fainting spells, occurring weekly, triggered by prolonged standing, postural changes, pain, large meals, or use of Fleet enemas for severe constipation, which has been attributed to an overactive vagus nerve. She has a history of anorexia nervosa, with a relapse two years ago, and has engaged in intensive therapy. She has two sons, both with ADHD, and one with moderate autism spectrum disorder. She has a history of restless leg  syndrome and sleepwalking as a child. She reports no motor tics, tremors, or seizures. She denies any concussive events with alteration of consciousness. She reports a history of fainting spells, sometimes resulting in injury, but no head trauma. She has a  history of GI issues, including severe constipation requiring Fleet enemas. She reports a history of eating disorders, specifically anorexia, with a relapse two years ago. She has a history of restless leg syndrome and sleepwalking as a child. She denies any motor tics, tremors, or seizures. She denies any concussive events with alteration of consciousness.  The patient reports that at some point in the past that attempts to treat her with psychostimulant medications for her attentional issues and sleep issues were utilized but she did not respond well to these medications and exacerbated some of her other symptoms.  Current psychotropic medications include Lexapro , BuSpar  and Ambien  at periodically at night to aid in sleep onset.  There is also a brief attempt with Seroquel  to aid with sleep at night in 2023 but this was not continued.  The patient is also taking Zoloft /sertraline  back between 2016 and 2023.  The patient's electronic medical records did not show any prescriptions for psychotropics at least going back to 2017 and these attempts were done sometime ago.  Onset and Duration of Symptoms:  Symptoms of fatigue and attention difficulties began in high school following a diagnosis of mononucleosis. The fatigue has been persistent and intermittent since then. The attention difficulties were more noticeable in college, where she struggled with completing readings and coursework. Others have noted her inability to remain still and her scattered thoughts.  Progression of Symptoms:  The symptoms of fatigue and somnolence have been persistent since high school. The attention difficulties have been ongoing, with others noting her restlessness and difficulty focusing. The fatigue is described as debilitating and occurs daily.  Additional Tests and Measures from other records:  Neuroimaging Results: In her older medical records the patient had an MRI conducted in October 2012 that was requested due to  multiple syncopal events.  This was interpreted by Eather Popp, MD at the request of Francis Bull, MD (neurology) the impressions noted more of a normal finding related to factors that may be accounting for her syncopal events.  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  Laboratory Tests:  TSH levels were within normal limits on 12/17/2022 and 10/30/2020.   Sleep:  Reports sleeping well at night, approximately 8-10 hours, and feels rested upon waking. However, she experiences debilitating fatigue and somnolence during the day, particularly when inactive. A sleep study was negative for sleep apnea. She has a history of restless leg syndrome.  Diet Pattern:  Reports eating a balanced diet. Has a history of anorexia nervosa with a relapse two years ago. Currently reports adequate intake.  Medical History:   Past Medical History:  Diagnosis Date   Acid reflux    Anemia    Anxiety    Asthma    Chronic constipation    Hirschsprung disease    Pelvic floor dysfunction    Syncope, cardiogenic          Patient Active Problem List   Diagnosis Date Noted   Attention and concentration deficit 04/30/2024   POTS (postural orthostatic tachycardia syndrome) 12/24/2023   EDS (Ehlers-Danlos syndrome) 12/24/2023   Fatigue 12/05/2022   Hypersomnia 12/05/2022   Heart palpitations 08/05/2022   At risk for prolonged QT interval syndrome 10/05/2021  MDD (major depressive disorder), recurrent, in full remission 07/09/2021   Anorexia nervosa (HCC) 04/24/2021   Insomnia 04/24/2021   Frequent UTI 09/26/2020   Endometriosis 09/26/2020   MDD (major depressive disorder), recurrent episode, moderate (HCC) 09/26/2020   GAD (generalized anxiety disorder) 09/26/2020   PTSD (post-traumatic stress disorder) 09/26/2020   Panic attacks 09/26/2020   History of anorexia nervosa 09/26/2020   Hypotension, chronic 04/13/2017   Constipation due to outlet  dysfunction 07/29/2016   Pelvic floor dysfunction 09/19/2015   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 Eye contact was fair. Speech was clear and coherent with normal volume. Mood was anxious and depressed. Affect was appropriate. Thought process was goal-directed and associations were intact. Orientation was full. Thought content was logical. No suicidal or homicidal ideation was reported. Memory was reported as fair for immediate, recent, and remote recall. Judgment and insight were fair. Concentration and attention span were fair. Fund of knowledge and language were fair. Psychomotor activity was normal. No akathisia was observed. Right-handed.   Intelligence:   normal  Marital Status/Living:   Born in [place] , moved to [place]  in 2003. Has an older sister. Currently lives with her mother and stepfather. She is single and has two sons.  Educational and Occupational History:     Highest Level of Education:  Graduated high school. Completed three semesters of college but did not finish due to declining health.  Educational and Career Achievements: Did well in science classes but had relative weakness in math. Reports dyslexic tendencies when reading.  Current Occupation:    Disabled.  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  Hobbies and Interests:  Enjoys watching sports, including basketball and football.  Enjoys hiking, swimming, and going to the beach.  Psychiatric History:  History of GAD, MDD, PTSD, anorexia nervosa, and primary insomnia. Past trials of Seroquel , Wellbutrin, BuSpar , Prozac, Paxil, Xanax, Effexor, Lexapro , Zoloft , and Cymbalta.  Abuse/Trauma History: The patient describes a history of significant medical trauma when she was a child related to difficulty initially diagnosing an treating abdominal pain that ultimately resulted in serious medical complications.  History of Substance Use or Abuse:  No history of alcohol or drug use.  Family Med/Psych History:  Family History  Problem Relation Age of Onset   Cancer Mother    Diabetes Mother    Hypertension Mother    Cancer Father    Alcohol abuse Father    Depression Father    Cancer Maternal Grandfather    Factor IX deficiency Maternal Grandfather    Factor IX deficiency Son    ADD / ADHD Son    Autism Son    Kidney cancer Neg Hx    Bladder Cancer Neg Hx     Impression/DX:   Breeonna Mone is a 39 year old female with a complex medical and psychiatric history presenting for neuropsychological evaluation. She reports persistent, debilitating fatigue and attention difficulties since high school, with a history of mononucleosis and chronic fatigue syndrome.  A sleep study was negative for sleep apnea, but narcolepsy is being considered. She has a history of PTSD from childhood medical trauma, vasovagal syncope, anorexia nervosa, and restless leg syndrome. She has two sons with ADHD, one with moderate autism. Her symptoms significantly impact her daily functioning, including driving.  Disposition/Plan:  A neuropsychological evaluation will be scheduled. This will include a comprehensive attention battery to objectively assess various components of attention (encoding, shifting, sustaining, freedom from distractibility, speed of processing) across visual and auditory modalities. The evaluation will also assess executive  functions. The assessment will be administered by a psychometrician. The results will be compiled into a formal report, which will be made available to the referring physician and will be accessible in the EMR. A feedback session will be scheduled to review the results and recommendations.  Diagnosis:    Attention and concentration deficit  MDD (major depressive disorder), recurrent episode, moderate (HCC)  GAD (generalized anxiety disorder)  PTSD (post-traumatic stress disorder)  POTS (postural orthostatic tachycardia syndrome)  Panic attacks  Primary insomnia        Note: This document was prepared using Dragon voice recognition software and may include unintentional dictation errors.   Electronically Signed   _______________________ Norleen Asa, Psy.D. Clinical Neuropsychologist

## 2024-10-06 ENCOUNTER — Encounter

## 2024-10-07 ENCOUNTER — Ambulatory Visit: Admitting: Psychiatry

## 2024-12-23 ENCOUNTER — Encounter: Admitting: Psychology

## 2024-12-29 ENCOUNTER — Encounter: Admitting: Psychology
